# Patient Record
Sex: Female | Born: 1985 | Race: White | Hispanic: No | Marital: Single | Smoking: Current every day smoker
Health system: Southern US, Community
[De-identification: ages and names within clinical notes are randomized; demographics above are authoritative.]

## PROBLEM LIST (undated history)

## (undated) ENCOUNTER — Emergency Department: Discharge status: Absent without leave | Payer: Medicare Other

## (undated) ENCOUNTER — Emergency Department (HOSPITAL_COMMUNITY): Disposition: A | Discharge status: Other Acute Inpatient Hospital | Payer: Medicare Other

## (undated) ENCOUNTER — Emergency Department: Admission: EM | Payer: Medicare Other

## (undated) DIAGNOSIS — B009 Herpesviral infection, unspecified: Secondary | ICD-10-CM

## (undated) DIAGNOSIS — B1920 Unspecified viral hepatitis C without hepatic coma: Secondary | ICD-10-CM

## (undated) DIAGNOSIS — F1020 Alcohol dependence, uncomplicated: Secondary | ICD-10-CM

## (undated) DIAGNOSIS — F250 Schizoaffective disorder, bipolar type: Secondary | ICD-10-CM

## (undated) DIAGNOSIS — G809 Cerebral palsy, unspecified: Secondary | ICD-10-CM

## (undated) DIAGNOSIS — F1910 Other psychoactive substance abuse, uncomplicated: Secondary | ICD-10-CM

## (undated) DIAGNOSIS — I639 Cerebral infarction, unspecified: Secondary | ICD-10-CM

## (undated) DIAGNOSIS — R569 Unspecified convulsions: Secondary | ICD-10-CM

## (undated) HISTORY — PX: FOOT SURGERY: SHX648

## (undated) HISTORY — PX: LEG SURGERY: SHX1003

---
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
## 2005-01-01 ENCOUNTER — Emergency Department (HOSPITAL_COMMUNITY): Admission: EM | Admit: 2005-01-01 | Discharge: 2005-01-01 | Payer: Self-pay | Admitting: Emergency Medicine

## 2005-01-20 ENCOUNTER — Emergency Department (HOSPITAL_COMMUNITY): Admission: EM | Admit: 2005-01-20 | Discharge: 2005-01-20 | Payer: Self-pay | Admitting: Emergency Medicine

## 2005-09-16 ENCOUNTER — Emergency Department (HOSPITAL_COMMUNITY): Admission: EM | Admit: 2005-09-16 | Discharge: 2005-09-17 | Payer: Self-pay | Admitting: Emergency Medicine

## 2009-10-10 ENCOUNTER — Emergency Department (HOSPITAL_COMMUNITY): Admission: EM | Admit: 2009-10-10 | Discharge: 2009-10-10 | Payer: Self-pay | Admitting: Emergency Medicine

## 2009-10-11 ENCOUNTER — Emergency Department (HOSPITAL_COMMUNITY): Admission: EM | Admit: 2009-10-11 | Discharge: 2009-10-11 | Payer: Self-pay | Admitting: Emergency Medicine

## 2009-10-12 ENCOUNTER — Emergency Department (HOSPITAL_COMMUNITY): Admission: EM | Admit: 2009-10-12 | Discharge: 2009-10-12 | Payer: Self-pay | Admitting: Emergency Medicine

## 2009-10-22 ENCOUNTER — Inpatient Hospital Stay (HOSPITAL_COMMUNITY): Admission: EM | Admit: 2009-10-22 | Discharge: 2009-10-24 | Payer: Self-pay | Admitting: Emergency Medicine

## 2010-05-03 LAB — HEPATIC FUNCTION PANEL: AST: 74 U/L — ABNORMAL HIGH (ref 0–37)

## 2010-05-03 LAB — BASIC METABOLIC PANEL
BUN: 14 mg/dL (ref 6–23)
CO2: 20 mEq/L (ref 19–32)
Calcium: 8.4 mg/dL (ref 8.4–10.5)
Chloride: 103 mEq/L (ref 96–112)
GFR calc Af Amer: >60 mL/min (ref >60)
GFR calc non Af Amer: >60 mL/min (ref >60)
Potassium: 3.8 mEq/L (ref 3.5–5.1)
Sodium: 135 mEq/L (ref 135–145)

## 2010-05-03 LAB — COMPREHENSIVE METABOLIC PANEL
ALT: 27 U/L (ref 0–35)
AST: 46 U/L — ABNORMAL HIGH (ref 0–37)
Albumin: 3 g/dL — ABNORMAL LOW (ref 3.5–5.2)
Albumin: 3.2 g/dL — ABNORMAL LOW (ref 3.5–5.2)
Alkaline Phosphatase: 60 U/L (ref 39–117)
BUN: 7 mg/dL (ref 6–23)
CO2: 26 mEq/L (ref 19–32)
Calcium: 8.1 mg/dL — ABNORMAL LOW (ref 8.4–10.5)
Chloride: 107 mEq/L (ref 96–112)
Creatinine, Ser: 0.59 mg/dL (ref 0.4–1.2)
Creatinine, Ser: 0.66 mg/dL (ref 0.4–1.2)
GFR calc non Af Amer: >60 mL/min (ref >60)
Sodium: 138 mEq/L (ref 135–145)
Total Bilirubin: 0.7 mg/dL (ref 0.3–1.2)
Total Bilirubin: 0.8 mg/dL (ref 0.3–1.2)
Total Protein: 5.8 g/dL — ABNORMAL LOW (ref 6.0–8.3)

## 2010-05-03 LAB — URINALYSIS, ROUTINE W REFLEX MICROSCOPIC
Glucose, UA: NEGATIVE mg/dL
Ketones, ur: NEGATIVE mg/dL
Nitrite: NEGATIVE
Protein, ur: NEGATIVE mg/dL
Urobilinogen, UA: 0.2 mg/dL (ref 0.0–1.0)

## 2010-05-03 LAB — CBC
HCT: 32.1 % — ABNORMAL LOW (ref 36.0–46.0)
HCT: 33 % — ABNORMAL LOW (ref 36.0–46.0)
HCT: 34.9 % — ABNORMAL LOW (ref 36.0–46.0)
Hemoglobin: 11.1 g/dL — ABNORMAL LOW (ref 12.0–15.0)
Hemoglobin: 12 g/dL (ref 12.0–15.0)
MCH: 35 pg — ABNORMAL HIGH (ref 26.0–34.0)
MCH: 35.2 pg — ABNORMAL HIGH (ref 26.0–34.0)
MCHC: 34.5 g/dL (ref 30.0–36.0)
MCV: 100.8 fL — ABNORMAL HIGH (ref 78.0–100.0)
MCV: 101.9 fL — ABNORMAL HIGH (ref 78.0–100.0)
MCV: 102 fL — ABNORMAL HIGH (ref 78.0–100.0)
Platelets: 169 10*3/uL (ref 150–400)
RBC: 3.19 MIL/uL — ABNORMAL LOW (ref 3.87–5.11)
RBC: 3.42 MIL/uL — ABNORMAL LOW (ref 3.87–5.11)
WBC: 2.9 10*3/uL — ABNORMAL LOW (ref 4.0–10.5)

## 2010-05-03 LAB — MAGNESIUM: Magnesium: 2.1 mg/dL (ref 1.5–2.5)

## 2010-05-03 LAB — DIFFERENTIAL
Basophils Relative: 0 % (ref 0–1)
Eosinophils Absolute: 0 10*3/uL (ref 0.0–0.7)
Eosinophils Relative: 1 % (ref 0–5)
Lymphocytes Relative: 11 % — ABNORMAL LOW (ref 12–46)
Lymphocytes Relative: 43 % (ref 12–46)
Lymphs Abs: 1.1 10*3/uL (ref 0.7–4.0)
Monocytes Absolute: 0.3 10*3/uL (ref 0.1–1.0)
Monocytes Absolute: 0.5 10*3/uL (ref 0.1–1.0)
Monocytes Relative: 10 % (ref 3–12)
Monocytes Relative: 12 % (ref 3–12)
Neutro Abs: 1.1 10*3/uL — ABNORMAL LOW (ref 1.7–7.7)

## 2010-05-03 LAB — IRON AND TIBC
Iron: 83 ug/dL (ref 42–135)
Saturation Ratios: 34 % (ref 20–55)
TIBC: 244 ug/dL — ABNORMAL LOW (ref 250–470)

## 2010-05-03 LAB — URINE CULTURE: Culture  Setup Time: 201109061245

## 2010-05-03 LAB — GLUCOSE, CAPILLARY
Glucose-Capillary: 114 mg/dL — ABNORMAL HIGH (ref 70–99)
Glucose-Capillary: 134 mg/dL — ABNORMAL HIGH (ref 70–99)

## 2010-05-03 LAB — RAPID URINE DRUG SCREEN, HOSP PERFORMED
Opiates: NOT DETECTED
Tetrahydrocannabinol: NOT DETECTED

## 2010-05-03 LAB — RETICULOCYTES
Retic Count, Absolute: 21.1 10*3/uL (ref 19.0–186.0)
Retic Ct Pct: 0.6 % (ref 0.4–3.1)

## 2010-05-03 LAB — PREGNANCY, URINE: Preg Test, Ur: NEGATIVE

## 2010-05-03 LAB — FERRITIN: Ferritin: 200 ng/mL (ref 10–291)

## 2010-05-03 LAB — ETHANOL: Alcohol, Ethyl (B): 5 mg/dL (ref 0–10)

## 2010-05-03 LAB — VITAMIN B12: Vitamin B-12: 311 pg/mL (ref 211–911)

## 2010-05-04 LAB — RAPID URINE DRUG SCREEN, HOSP PERFORMED
Amphetamines: NOT DETECTED
Barbiturates: NOT DETECTED
Benzodiazepines: POSITIVE — AB
Tetrahydrocannabinol: NOT DETECTED

## 2010-05-04 LAB — GLUCOSE, CAPILLARY: Glucose-Capillary: 87 mg/dL (ref 70–99)

## 2010-05-04 LAB — CBC
HCT: 36.7 % (ref 36.0–46.0)
MCV: 101 fL — ABNORMAL HIGH (ref 78.0–100.0)
MCV: 101.7 fL — ABNORMAL HIGH (ref 78.0–100.0)
Platelets: 154 10*3/uL (ref 150–400)
Platelets: 174 10*3/uL (ref 150–400)
RBC: 3.63 MIL/uL — ABNORMAL LOW (ref 3.87–5.11)
RBC: 3.63 MIL/uL — ABNORMAL LOW (ref 3.87–5.11)
WBC: 6.1 10*3/uL (ref 4.0–10.5)
WBC: 7 10*3/uL (ref 4.0–10.5)

## 2010-05-04 LAB — BASIC METABOLIC PANEL
Calcium: 8.7 mg/dL (ref 8.4–10.5)
Chloride: 102 mEq/L (ref 96–112)
Chloride: 110 mEq/L (ref 96–112)
Creatinine, Ser: 0.75 mg/dL (ref 0.4–1.2)
Creatinine, Ser: 0.89 mg/dL (ref 0.4–1.2)
GFR calc Af Amer: >60 mL/min (ref >60)
GFR calc Af Amer: >60 mL/min (ref >60)
Potassium: 3.9 mEq/L (ref 3.5–5.1)

## 2010-05-04 LAB — URINALYSIS, ROUTINE W REFLEX MICROSCOPIC
Bilirubin Urine: NEGATIVE
Ketones, ur: NEGATIVE mg/dL
Nitrite: NEGATIVE
Nitrite: NEGATIVE
Protein, ur: NEGATIVE mg/dL
Specific Gravity, Urine: 1.019 (ref 1.005–1.030)
pH: 7 (ref 5.0–8.0)

## 2010-05-04 LAB — PREGNANCY, URINE
Preg Test, Ur: NEGATIVE
Preg Test, Ur: NEGATIVE

## 2010-05-04 LAB — DIFFERENTIAL
Eosinophils Relative: 2 % (ref 0–5)
Lymphocytes Relative: 22 % (ref 12–46)
Lymphs Abs: 1.5 10*3/uL (ref 0.7–4.0)
Neutro Abs: 4.8 10*3/uL (ref 1.7–7.7)

## 2010-05-04 LAB — ETHANOL
Alcohol, Ethyl (B): 351 mg/dL — ABNORMAL HIGH (ref 0–10)
Alcohol, Ethyl (B): 7 mg/dL (ref 0–10)

## 2010-05-04 LAB — POCT I-STAT, CHEM 8
BUN: 9 mg/dL (ref 6–23)
Calcium, Ion: 0.82 mmol/L — ABNORMAL LOW (ref 1.12–1.32)
Chloride: 111 mEq/L (ref 96–112)

## 2010-05-04 LAB — URINE MICROSCOPIC-ADD ON

## 2013-08-29 ENCOUNTER — Emergency Department (HOSPITAL_COMMUNITY): Payer: Medicaid Other

## 2013-08-29 ENCOUNTER — Emergency Department (HOSPITAL_COMMUNITY)
Admission: EM | Admit: 2013-08-29 | Discharge: 2013-08-29 | Disposition: A | Discharge status: Home / Self Care | Payer: Medicaid Other | Attending: Emergency Medicine | Admitting: Emergency Medicine

## 2013-08-29 ENCOUNTER — Encounter (HOSPITAL_COMMUNITY): Payer: Self-pay | Admitting: Emergency Medicine

## 2013-08-29 DIAGNOSIS — G40909 Epilepsy, unspecified, not intractable, without status epilepticus: Secondary | ICD-10-CM | POA: Insufficient documentation

## 2013-08-29 DIAGNOSIS — F17200 Nicotine dependence, unspecified, uncomplicated: Secondary | ICD-10-CM | POA: Diagnosis not present

## 2013-08-29 DIAGNOSIS — M79601 Pain in right arm: Secondary | ICD-10-CM

## 2013-08-29 DIAGNOSIS — Z79899 Other long term (current) drug therapy: Secondary | ICD-10-CM | POA: Diagnosis not present

## 2013-08-29 DIAGNOSIS — Z8673 Personal history of transient ischemic attack (TIA), and cerebral infarction without residual deficits: Secondary | ICD-10-CM | POA: Diagnosis not present

## 2013-08-29 DIAGNOSIS — Z8619 Personal history of other infectious and parasitic diseases: Secondary | ICD-10-CM | POA: Diagnosis not present

## 2013-08-29 DIAGNOSIS — M79609 Pain in unspecified limb: Secondary | ICD-10-CM | POA: Insufficient documentation

## 2013-08-29 DIAGNOSIS — Z791 Long term (current) use of non-steroidal anti-inflammatories (NSAID): Secondary | ICD-10-CM | POA: Insufficient documentation

## 2013-08-29 HISTORY — DX: Cerebral palsy, unspecified: G80.9

## 2013-08-29 HISTORY — DX: Unspecified viral hepatitis C without hepatic coma: B19.20

## 2013-08-29 HISTORY — DX: Herpesviral infection, unspecified: B00.9

## 2013-08-29 HISTORY — DX: Unspecified convulsions: R56.9

## 2013-08-29 HISTORY — DX: Cerebral infarction, unspecified: I63.9

## 2013-08-29 MED ORDER — NAPROXEN 500 MG PO TABS: 500.0000 mg | ORAL_TABLET | Freq: Two times a day (BID) | ORAL | Status: DC

## 2013-08-29 NOTE — ED Notes (Signed)
Patient with no complaints at this time. Respirations even and unlabored. Skin warm/dry. Discharge instructions reviewed with patient at this time. Patient given opportunity to voice concerns/ask questions. Patient discharged at this time and left Emergency Department with steady gait.   
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
## 2013-08-29 NOTE — Discharge Instructions (Signed)
Xray normal;  meds for inflammation
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
## 2013-09-14 ENCOUNTER — Emergency Department (HOSPITAL_COMMUNITY): Payer: Medicare Other

## 2013-09-14 ENCOUNTER — Encounter (HOSPITAL_COMMUNITY): Payer: Self-pay | Admitting: Emergency Medicine

## 2013-09-14 ENCOUNTER — Inpatient Hospital Stay (HOSPITAL_COMMUNITY)
Admission: EM | Admit: 2013-09-14 | Discharge: 2013-09-20 | DRG: 504 | Disposition: A | Discharge status: Nursing Home (Includes ICF) | Payer: Medicare Other | Attending: Internal Medicine | Admitting: Internal Medicine

## 2013-09-14 DIAGNOSIS — B182 Chronic viral hepatitis C: Secondary | ICD-10-CM

## 2013-09-14 DIAGNOSIS — G40909 Epilepsy, unspecified, not intractable, without status epilepticus: Secondary | ICD-10-CM | POA: Diagnosis present

## 2013-09-14 DIAGNOSIS — Z818 Family history of other mental and behavioral disorders: Secondary | ICD-10-CM

## 2013-09-14 DIAGNOSIS — D7589 Other specified diseases of blood and blood-forming organs: Secondary | ICD-10-CM | POA: Diagnosis present

## 2013-09-14 DIAGNOSIS — L03031 Cellulitis of right toe: Secondary | ICD-10-CM

## 2013-09-14 DIAGNOSIS — R569 Unspecified convulsions: Secondary | ICD-10-CM

## 2013-09-14 DIAGNOSIS — F250 Schizoaffective disorder, bipolar type: Secondary | ICD-10-CM

## 2013-09-14 DIAGNOSIS — M79609 Pain in unspecified limb: Secondary | ICD-10-CM

## 2013-09-14 DIAGNOSIS — M79674 Pain in right toe(s): Secondary | ICD-10-CM

## 2013-09-14 DIAGNOSIS — B1920 Unspecified viral hepatitis C without hepatic coma: Secondary | ICD-10-CM

## 2013-09-14 DIAGNOSIS — L02611 Cutaneous abscess of right foot: Secondary | ICD-10-CM | POA: Diagnosis present

## 2013-09-14 DIAGNOSIS — Z8673 Personal history of transient ischemic attack (TIA), and cerebral infarction without residual deficits: Secondary | ICD-10-CM

## 2013-09-14 DIAGNOSIS — L02619 Cutaneous abscess of unspecified foot: Secondary | ICD-10-CM | POA: Diagnosis present

## 2013-09-14 DIAGNOSIS — G809 Cerebral palsy, unspecified: Secondary | ICD-10-CM | POA: Diagnosis present

## 2013-09-14 DIAGNOSIS — F17200 Nicotine dependence, unspecified, uncomplicated: Secondary | ICD-10-CM | POA: Diagnosis present

## 2013-09-14 DIAGNOSIS — L97509 Non-pressure chronic ulcer of other part of unspecified foot with unspecified severity: Secondary | ICD-10-CM | POA: Diagnosis present

## 2013-09-14 DIAGNOSIS — M869 Osteomyelitis, unspecified: Secondary | ICD-10-CM | POA: Diagnosis not present

## 2013-09-14 DIAGNOSIS — F259 Schizoaffective disorder, unspecified: Secondary | ICD-10-CM | POA: Diagnosis present

## 2013-09-14 DIAGNOSIS — L03039 Cellulitis of unspecified toe: Secondary | ICD-10-CM | POA: Diagnosis present

## 2013-09-14 DIAGNOSIS — L03115 Cellulitis of right lower limb: Secondary | ICD-10-CM

## 2013-09-14 DIAGNOSIS — L03119 Cellulitis of unspecified part of limb: Secondary | ICD-10-CM

## 2013-09-14 HISTORY — DX: Schizoaffective disorder, bipolar type: F25.0

## 2013-09-14 LAB — CBC WITH DIFFERENTIAL/PLATELET
Basophils Absolute: 0 10*3/uL (ref 0.0–0.1)
Basophils Relative: 0 % (ref 0–1)
Eosinophils Absolute: 0.1 10*3/uL (ref 0.0–0.7)
Eosinophils Relative: 1 % (ref 0–5)
HCT: 40 % (ref 36.0–46.0)
Hemoglobin: 13.6 g/dL (ref 12.0–15.0)
Lymphocytes Relative: 20 % (ref 12–46)
Lymphs Abs: 1.8 10*3/uL (ref 0.7–4.0)
MCH: 37 pg — ABNORMAL HIGH (ref 26.0–34.0)
MCHC: 34 g/dL (ref 30.0–36.0)
MCV: 108.7 fL — ABNORMAL HIGH (ref 78.0–100.0)
Monocytes Absolute: 1 10*3/uL (ref 0.1–1.0)
Monocytes Relative: 11 % (ref 3–12)
Neutro Abs: 6.3 10*3/uL (ref 1.7–7.7)
Neutrophils Relative %: 68 % (ref 43–77)
Platelets: 261 10*3/uL (ref 150–400)
RBC: 3.68 MIL/uL — ABNORMAL LOW (ref 3.87–5.11)
RDW: 12.5 % (ref 11.5–15.5)
WBC: 9.2 10*3/uL (ref 4.0–10.5)

## 2013-09-14 LAB — BASIC METABOLIC PANEL
Anion gap: 13 (ref 5–15)
BUN: 6 mg/dL (ref 6–23)
CO2: 26 mEq/L (ref 19–32)
Calcium: 9.3 mg/dL (ref 8.4–10.5)
Chloride: 95 mEq/L — ABNORMAL LOW (ref 96–112)
Creatinine, Ser: 0.65 mg/dL (ref 0.50–1.10)
GFR calc Af Amer: >90 mL/min (ref >90)
GFR calc non Af Amer: >90 mL/min (ref >90)
Glucose, Bld: 96 mg/dL (ref 70–99)
Potassium: 4.5 mEq/L (ref 3.7–5.3)
Sodium: 134 mEq/L — ABNORMAL LOW (ref 137–147)

## 2013-09-14 MED ORDER — NICOTINE 14 MG/24HR TD PT24
14.0000 mg | MEDICATED_PATCH | Freq: Once | TRANSDERMAL | Status: AC
  Administered 2013-09-14: 14 mg via TRANSDERMAL
  Filled 2013-09-14 (×2): qty 1

## 2013-09-14 MED ORDER — CLINDAMYCIN PHOSPHATE 600 MG/50ML IV SOLN
600.0000 mg | Freq: Three times a day (TID) | INTRAVENOUS | Status: DC
  Administered 2013-09-14 – 2013-09-15 (×2): 600 mg via INTRAVENOUS
  Filled 2013-09-14 (×3): qty 50

## 2013-09-14 MED ORDER — CLINDAMYCIN PHOSPHATE 600 MG/50ML IV SOLN
600.0000 mg | Freq: Once | INTRAVENOUS | Status: AC
  Administered 2013-09-14: 600 mg via INTRAVENOUS
  Filled 2013-09-14: qty 50

## 2013-09-14 MED ORDER — CLINDAMYCIN HCL 150 MG PO CAPS: 450.0000 mg | ORAL_CAPSULE | Freq: Once | ORAL | Status: DC

## 2013-09-14 MED ORDER — DEXTROSE 5 % IV SOLN: 450.0000 mg | Freq: Once | INTRAVENOUS | Status: DC

## 2013-09-14 MED ORDER — OXYCODONE-ACETAMINOPHEN 5-325 MG PO TABS
1.0000 | ORAL_TABLET | Freq: Once | ORAL | Status: AC
  Administered 2013-09-14: 1 via ORAL
  Filled 2013-09-14: qty 1

## 2013-09-14 MED ORDER — NAPROXEN 250 MG PO TABS
500.0000 mg | ORAL_TABLET | Freq: Two times a day (BID) | ORAL | Status: DC
  Administered 2013-09-14 – 2013-09-20 (×11): 500 mg via ORAL
  Filled 2013-09-14 (×11): qty 2

## 2013-09-14 MED ORDER — CLINDAMYCIN PHOSPHATE 600 MG/50ML IV SOLN
INTRAVENOUS | Status: AC
  Filled 2013-09-14: qty 100

## 2013-09-14 MED ORDER — DIVALPROEX SODIUM ER 250 MG PO TB24
250.0000 mg | ORAL_TABLET | Freq: Every day | ORAL | Status: DC
  Administered 2013-09-15 – 2013-09-20 (×6): 250 mg via ORAL
  Filled 2013-09-14 (×11): qty 1

## 2013-09-14 MED ORDER — BUPIVACAINE HCL (PF) 0.5 % IJ SOLN
10.0000 mL | Freq: Once | INTRAMUSCULAR | Status: AC
  Administered 2013-09-14: 10 mL
  Filled 2013-09-14: qty 30

## 2013-09-14 MED ORDER — HYDROCODONE-ACETAMINOPHEN 5-325 MG PO TABS
1.0000 | ORAL_TABLET | ORAL | Status: DC | PRN
  Administered 2013-09-14: 1 via ORAL
  Administered 2013-09-15 – 2013-09-18 (×14): 2 via ORAL
  Filled 2013-09-14 (×8): qty 2
  Filled 2013-09-14: qty 1
  Filled 2013-09-14 (×6): qty 2

## 2013-09-14 MED ORDER — SODIUM CHLORIDE 0.9 % IV SOLN
INTRAVENOUS | Status: AC
  Administered 2013-09-14: 22:00:00 via INTRAVENOUS

## 2013-09-14 NOTE — ED Notes (Signed)
Labs redrawn by nurse d/t blood hemolyzed from IV stick and lab unable to draw.
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
## 2013-09-14 NOTE — ED Notes (Signed)
Pt states he right second toe is infected. States she noticed it last week
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
## 2013-09-14 NOTE — ED Notes (Signed)
Verbal order to hold po med d/t MD changing med to IV

## 2013-09-14 NOTE — ED Notes (Signed)
MD at bedside. 

## 2013-09-14 NOTE — ED Notes (Signed)
Pt c/o pain, swelling, redness to right second toe, denies any injury, pt states that she was sent to er from health department for further evaluation,

## 2013-09-15 ENCOUNTER — Inpatient Hospital Stay (HOSPITAL_COMMUNITY): Payer: Medicare Other

## 2013-09-15 DIAGNOSIS — G40909 Epilepsy, unspecified, not intractable, without status epilepticus: Secondary | ICD-10-CM | POA: Diagnosis present

## 2013-09-15 DIAGNOSIS — D7589 Other specified diseases of blood and blood-forming organs: Secondary | ICD-10-CM | POA: Diagnosis present

## 2013-09-15 DIAGNOSIS — B1920 Unspecified viral hepatitis C without hepatic coma: Secondary | ICD-10-CM | POA: Diagnosis present

## 2013-09-15 DIAGNOSIS — L02619 Cutaneous abscess of unspecified foot: Secondary | ICD-10-CM | POA: Diagnosis present

## 2013-09-15 DIAGNOSIS — L03039 Cellulitis of unspecified toe: Secondary | ICD-10-CM | POA: Diagnosis present

## 2013-09-15 DIAGNOSIS — Z8673 Personal history of transient ischemic attack (TIA), and cerebral infarction without residual deficits: Secondary | ICD-10-CM | POA: Diagnosis not present

## 2013-09-15 DIAGNOSIS — L97509 Non-pressure chronic ulcer of other part of unspecified foot with unspecified severity: Secondary | ICD-10-CM | POA: Diagnosis present

## 2013-09-15 DIAGNOSIS — M79609 Pain in unspecified limb: Secondary | ICD-10-CM | POA: Diagnosis present

## 2013-09-15 DIAGNOSIS — M869 Osteomyelitis, unspecified: Secondary | ICD-10-CM | POA: Diagnosis present

## 2013-09-15 DIAGNOSIS — F259 Schizoaffective disorder, unspecified: Secondary | ICD-10-CM | POA: Diagnosis present

## 2013-09-15 DIAGNOSIS — Z818 Family history of other mental and behavioral disorders: Secondary | ICD-10-CM | POA: Diagnosis not present

## 2013-09-15 DIAGNOSIS — F17200 Nicotine dependence, unspecified, uncomplicated: Secondary | ICD-10-CM | POA: Diagnosis present

## 2013-09-15 DIAGNOSIS — L03119 Cellulitis of unspecified part of limb: Secondary | ICD-10-CM | POA: Diagnosis present

## 2013-09-15 DIAGNOSIS — G809 Cerebral palsy, unspecified: Secondary | ICD-10-CM | POA: Diagnosis present

## 2013-09-15 LAB — CBC
HCT: 37.5 % (ref 36.0–46.0)
HEMOGLOBIN: 12.7 g/dL (ref 12.0–15.0)
MCH: 36.6 pg — AB (ref 26.0–34.0)
MCHC: 33.9 g/dL (ref 30.0–36.0)
MCV: 108.1 fL — AB (ref 78.0–100.0)
PLATELETS: 238 10*3/uL (ref 150–400)
RBC: 3.47 MIL/uL — AB (ref 3.87–5.11)
RDW: 12.4 % (ref 11.5–15.5)
WBC: 7.9 10*3/uL (ref 4.0–10.5)

## 2013-09-15 LAB — BASIC METABOLIC PANEL
Anion gap: 12 (ref 5–15)
BUN: 6 mg/dL (ref 6–23)
CO2: 26 mEq/L (ref 19–32)
Calcium: 8.7 mg/dL (ref 8.4–10.5)
Chloride: 96 mEq/L (ref 96–112)
Creatinine, Ser: 0.59 mg/dL (ref 0.50–1.10)
GFR calc Af Amer: >90 mL/min (ref >90)
GFR calc non Af Amer: >90 mL/min (ref >90)
Glucose, Bld: 86 mg/dL (ref 70–99)
Potassium: 3.9 mEq/L (ref 3.7–5.3)
Sodium: 134 mEq/L — ABNORMAL LOW (ref 137–147)

## 2013-09-15 LAB — HEPATIC FUNCTION PANEL
ALK PHOS: 67 U/L (ref 39–117)
ALT: 8 U/L (ref 0–35)
AST: 14 U/L (ref 0–37)
Albumin: 3.4 g/dL — ABNORMAL LOW (ref 3.5–5.2)
BILIRUBIN TOTAL: 0.3 mg/dL (ref 0.3–1.2)
Bilirubin, Direct: <0.2 mg/dL (ref 0.0–0.3)
TOTAL PROTEIN: 6.8 g/dL (ref 6.0–8.3)

## 2013-09-15 LAB — VALPROIC ACID LEVEL: Valproic Acid Lvl: 15 ug/mL — ABNORMAL LOW (ref 50.0–100.0)

## 2013-09-15 LAB — TSH: TSH: 1.43 u[IU]/mL (ref 0.350–4.500)

## 2013-09-15 LAB — PREGNANCY, URINE: Preg Test, Ur: NEGATIVE

## 2013-09-15 MED ORDER — GADOBENATE DIMEGLUMINE 529 MG/ML IV SOLN
14.0000 mL | Freq: Once | INTRAVENOUS | Status: AC | PRN
  Administered 2013-09-15: 14 mL via INTRAVENOUS

## 2013-09-15 MED ORDER — VANCOMYCIN HCL IN DEXTROSE 1-5 GM/200ML-% IV SOLN
1000.0000 mg | Freq: Two times a day (BID) | INTRAVENOUS | Status: DC
  Administered 2013-09-15 – 2013-09-17 (×5): 1000 mg via INTRAVENOUS
  Filled 2013-09-15 (×11): qty 200

## 2013-09-15 MED ORDER — LORAZEPAM 2 MG/ML IJ SOLN: 0.5000 mg | Freq: Once | INTRAMUSCULAR | Status: AC | PRN

## 2013-09-15 MED ORDER — HYDROMORPHONE HCL PF 1 MG/ML IJ SOLN
0.5000 mg | INTRAMUSCULAR | Status: DC | PRN
  Administered 2013-09-17 – 2013-09-18 (×3): 0.5 mg via INTRAVENOUS
  Filled 2013-09-15 (×3): qty 1

## 2013-09-15 MED ORDER — ENOXAPARIN SODIUM 40 MG/0.4ML ~~LOC~~ SOLN
40.0000 mg | SUBCUTANEOUS | Status: DC
  Administered 2013-09-15 – 2013-09-16 (×2): 40 mg via SUBCUTANEOUS
  Filled 2013-09-15 (×2): qty 0.4

## 2013-09-15 NOTE — Progress Notes (Signed)
ANTIBIOTIC CONSULT NOTE - INITIAL  Pharmacy Consult for Vancomycin Indication: cellulitis  No Known Allergies  Patient Measurements: Height: 5\' 4"  (162.6 cm) Weight: 155 lb 12.8 oz (70.67 kg) IBW/kg (Calculated) : 54.7  Vital Signs: Temp: 97.4 F (36.3 C) (07/29 0519) Temp src: Oral (07/29 0519) BP: 92/63 mmHg (07/29 0519) Pulse Rate: 63 (07/29 0519) Intake/Output from previous day: 07/28 0701 - 07/29 0700 In: 675 [I.V.:575; IV Piggyback:100] Out: -  Intake/Output from this shift:    Labs:  Recent Labs  09/14/13 1820 09/15/13 0520  WBC 9.2 7.9  HGB 13.6 12.7  PLT 261 238  CREATININE 0.65 0.59   Estimated Creatinine Clearance: 100.1 ml/min (by C-G formula based on Cr of 0.59). No results found for this basename: VANCOTROUGH, VANCOPEAK, VANCORANDOM, GENTTROUGH, GENTPEAK, GENTRANDOM, TOBRATROUGH, TOBRAPEAK, TOBRARND, AMIKACINPEAK, AMIKACINTROU, AMIKACIN,  in the last 72 hours   Microbiology: No results found for this or any previous visit (from the past 720 hour(s)).  Medical History: Past Medical History  Diagnosis Date  . CP (cerebral palsy)   . Hepatitis C   . Herpes   . Stroke   . Seizures     Medications:  Scheduled:  . divalproex  250 mg Oral Daily  . naproxen  500 mg Oral BID  . nicotine  14 mg Transdermal Once   Assessment: 28 yo F with cerebal palsy who lives in a group home.  She presents with worsening pain/swelling/redness of right 2nd toe.   She is afebrile with normal WBC.   MRI of toe & ortho consult pending.   Vancomycin 7/29>> Clindamycin 7/28>>7/29  Goal of Therapy:  Vancomycin trough level 10-15 mcg/ml  Plan:  Vancomycin 1gm IV q12h Check Vancomycin trough at steady state Monitor renal function and cx data   Elson ClanLilliston, Sandip Power Michelle 09/15/2013,7:59 AM

## 2013-09-15 NOTE — Care Management Note (Addendum)
    Page 1 of 1   09/20/2013     11:44:58 AM CARE MANAGEMENT NOTE 09/20/2013  Patient:  Sylvia [name],Sylvia [name]   Account Number:  [id number]  Date Initiated:  09/15/2013  Documentation initiated by:  Sharrie RothmanBLACKWELL,TAMMY C  Subjective/Objective Assessment:   [facility]. Pt will return to facility at discharge.     Action/Plan:   CSW to arrange discharge to facility when medically stable.   Anticipated DC Date:  09/18/2013   Anticipated DC Plan:  ASSISTED LIVING / REST HOME      DC Planning Services  CM consult      Choice offered to / List presented to:  C-2 HC POA / Guardian   DME arranged  [facility]      DME agency  [facility]        Status of service:  Completed, signed off Medicare Important Message given?   (If response is "NO", the following Medicare IM given date fields will be blank) Date Medicare IM given:   Medicare IM given by:   Date Additional Medicare IM given:   Additional Medicare IM given by:    Discharge Disposition:  ASSISTED LIVING  Per UR Regulation:    If discussed at Long Length of Stay Meetings, dates discussed:    Comments:  09/20/2013 1140 Kathyrn SheriffJessica Childress, RN, MSN, PCCN Rolling walker order and (per POA request) Kara MeadEmma of [facility]. [facility] to deliver rolling walker before discharge. Discharge planned for today. Patient will be Discharge to ALF as arranged by CSW. No further CM needs at this time.  09/17/13 1116 Arlyss Queenammy Blackwell, RN BSN CM Pt for surgery today. Will probably be inpatient until Monday. CSW working to find new ALF placement for discharge.  09/15/13 1345 Arlyss Queenammy Blackwell, RN BSN CM
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
## 2013-09-15 NOTE — Consult Note (Signed)
Patient seen,  chart reviewed. As I will be leaving town, I have suggested to Dr. Allen NorrisSantini that Dr. Nolen MuMcKinney of podiatry be consulted. He will be here tomorrow. He will place the consult.
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
## 2013-09-16 DIAGNOSIS — M869 Osteomyelitis, unspecified: Secondary | ICD-10-CM | POA: Diagnosis present

## 2013-09-16 DIAGNOSIS — L03039 Cellulitis of unspecified toe: Secondary | ICD-10-CM

## 2013-09-16 DIAGNOSIS — G40909 Epilepsy, unspecified, not intractable, without status epilepticus: Secondary | ICD-10-CM

## 2013-09-16 DIAGNOSIS — L02619 Cutaneous abscess of unspecified foot: Secondary | ICD-10-CM

## 2013-09-16 DIAGNOSIS — L03031 Cellulitis of right toe: Secondary | ICD-10-CM

## 2013-09-16 DIAGNOSIS — L02611 Cutaneous abscess of right foot: Secondary | ICD-10-CM | POA: Diagnosis present

## 2013-09-16 LAB — CBC
HEMATOCRIT: 35.2 % — AB (ref 36.0–46.0)
Hemoglobin: 12.3 g/dL (ref 12.0–15.0)
MCH: 37.6 pg — AB (ref 26.0–34.0)
MCHC: 34.9 g/dL (ref 30.0–36.0)
MCV: 107.6 fL — ABNORMAL HIGH (ref 78.0–100.0)
Platelets: 243 10*3/uL (ref 150–400)
RBC: 3.27 MIL/uL — ABNORMAL LOW (ref 3.87–5.11)
RDW: 12.3 % (ref 11.5–15.5)
WBC: 5 10*3/uL (ref 4.0–10.5)

## 2013-09-16 LAB — BASIC METABOLIC PANEL
Anion gap: 11 (ref 5–15)
BUN: 9 mg/dL (ref 6–23)
CALCIUM: 9.2 mg/dL (ref 8.4–10.5)
CO2: 26 mEq/L (ref 19–32)
Chloride: 105 mEq/L (ref 96–112)
Creatinine, Ser: 0.6 mg/dL (ref 0.50–1.10)
GFR calc Af Amer: >90 mL/min (ref >90)
Glucose, Bld: 92 mg/dL (ref 70–99)
Potassium: 4.4 mEq/L (ref 3.7–5.3)
Sodium: 142 mEq/L (ref 137–147)

## 2013-09-16 MED ORDER — NICOTINE POLACRILEX 2 MG MT GUM
2.0000 mg | CHEWING_GUM | OROMUCOSAL | Status: DC | PRN
  Administered 2013-09-17 – 2013-09-20 (×11): 2 mg via ORAL
  Filled 2013-09-16 (×17): qty 1

## 2013-09-16 NOTE — Progress Notes (Signed)
I was contacted by the hospital case manager.  She informed me that Ms. Sylvia [name] has a guardian that will need to provide consent for the planned procedure.  She also stated that [name] will be relocating to a home in [place] upon discharge.  I asked if she would be able to attend her scheduled postoperative appointments.  I anticipate that she will need 1-2 postoperative appointments provide postoperative course is uneventful.  If she is unable to attend the scheduled postoperative appointments, I would recommend [place].  This would make it easier for her to attend scheduled postoperative appointments.
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
## 2013-09-16 NOTE — Consult Note (Signed)
Podiatry Consult Note  Reason for Consultation:  Osteomyelitis of the right second toe  History of Present Illness: Sylvia [name] is a 28 y.o. female who presented to the emergency department at the recommendation of Ferdie Ping with [facility].  She has been experiencing pain in the right second toe for about a week.  She has noticed redness and swelling of the right second toe.  Her symptoms have developed gradually.  She does not recall any trauma or inciting event.  Radiographs have been performed.  Paronychia was drained in the emergency room per the note of Dr. Juleen China.    Past Medical History  Diagnosis Date  . CP (cerebral palsy)   . Hepatitis C   . Herpes   . Stroke   . Seizures    Scheduled Meds: . divalproex  250 mg Oral Daily  . enoxaparin (LOVENOX) injection  40 mg Subcutaneous Q24H  . naproxen  500 mg Oral BID  . vancomycin  1,000 mg Intravenous Q12H   Continuous Infusions:  PRN Meds:.HYDROcodone-acetaminophen, HYDROmorphone (DILAUDID) injection  No Known Allergies Past Surgical History  Procedure Laterality Date  . Leg surgery Right     x7   Family History  Problem Relation Age of Onset  . Bipolar disorder Mother    Social History:  reports that she has been smoking Cigarettes.  She has a 14 pack-year smoking history. She has quit using smokeless tobacco. She reports that she drinks alcohol. She reports that she does not use illicit drugs.  Review of Systems: She denies any nausea, vomiting, fever or chills.  She has been experiencing pain, swelling and redness in her right second toe.  Physical Examination: Vital signs in last 24 hours:   Temp:  [97.7 F (36.5 C)-98 F (36.7 C)] 97.7 F (36.5 C) (07/30 0607) Pulse Rate:  [61-68] 68 (07/30 0607) Resp:  [11-20] 11 (07/30 0607) BP: (91-118)/(48-72) 91/48 mmHg (07/30 0607) SpO2:  [99 %-100 %] 99 % (07/30 0607)  Skin of both feet is warm.  There is erythema of the distal aspect of  the right second toe with an open wound present lateral to the nail.  No purulence was encountered.  There is marked edema of the distal aspect of the right second toe.  There are no ascending signs of cellulitis or lymphangitis present.  There is a mild hyperkeratotic lesion noted along the medial plantar aspect of the first metatarsal head of both feet.  Nails are long 1 through 5 bilaterally.  Dorsalis pedis pulses palpable bilaterally.  Posterior tibial pulses palpable bilaterally.  The fourth digit of both feet is retracted proximally consistent with brachymetatarsia.  Light touch sensation is grossly intact bilaterally.  Lab/Test Results:   Recent Labs  09/15/13 0520 09/16/13 0511  WBC 7.9 5.0  HGB 12.7 12.3  HCT 37.5 35.2*  PLT 238 243  NA 134* 142  K 3.9 4.4  CL 96 105  CO2 26 26  BUN 6 9  CREATININE 0.59 0.60  GLUCOSE 86 92  CALCIUM 8.7 9.2    No results found for this or any previous visit (from the past 240 hour(s)).   Mr Toes Right Wo/w Cm  09/16/2013   CLINICAL DATA:  Swelling and red RIGHT second toe.  Osteomyelitis.  EXAM: MRI OF THE RIGHT TOES WITHOUT AND WITH CONTRAST  TECHNIQUE: Multiplanar, multisequence MR imaging was performed both before and after administration of intravenous contrast.  CONTRAST:  14mL MULTIHANCE GADOBENATE DIMEGLUMINE 529 MG/ML  IV SOLN  COMPARISON:  Radiographs 09/14/2013.  FINDINGS: Hypoplastic fourth metatarsal. Diffuse edema and enhancement is present in the tip of the second toe of surrounding the terminal phalanx. There is ulceration along the tip of the toe, in the plantar and lateral surface. There is osteomyelitis of the terminal phalanx with effacement of normal fatty marrow and edema/enhancement. Osteolysis of the cortex of the terminal phalanx is present. Abscess extending deep to the ulceration measures 13 mm transverse, 15 mm plantar to dorsal and 1 cm in the long axis of the foot.  The other toes appear within normal limits aside from  shortening of the fourth toe. There is no osteomyelitis of the middle or proximal phalanx of the second toe. No clear evidence of septic arthritis.  IMPRESSION: Osteomyelitis of the terminal phalanx of the second toe with approximately 1 cm soft tissue abscess deep to ulcer on the second toe.   Electronically Signed   By: Andreas NewportGeoffrey  Lamke M.D.   On: 09/16/2013 08:13   Dg Foot Complete Right  09/14/2013   CLINICAL DATA:  Right foot pain.  EXAM: RIGHT FOOT COMPLETE - 3+ VIEW  COMPARISON:  None.  FINDINGS: There is no evidence of fracture or dislocation. There is no evidence of arthropathy. Congenital shortening of fourth metatarsal is noted. Soft tissues are unremarkable.  IMPRESSION: No acute abnormality seen in the right foot.   Electronically Signed   By: Roque LiasJames  Green M.D.   On: 09/14/2013 14:37    Assessment: 1.  Osteomyelitis of the distal phalanx of the second toe, right foot 2.  Cellulitis of the second toe, right foot  Plan: The podiatric pathology and treatment options were explained to her.  A partial amputation of the right second toe was recommended.  The benefits and risk of the surgical procedure were explained to her.  She agrees with this course of care.  Surgery is planned for tomorrow.  Cultures will be obtained in surgery.  Continue current IV antibiotic therapy.  She is to be NPO after midnight.  A Betadine dressing was applied to the right foot.  Cambri Plourde IVAN 09/16/2013, 9:41 AM

## 2013-09-16 NOTE — Clinical Social Work Note (Signed)
Guardian called and requested for pt to write statement saying whether she agrees with amputation. [name] to call and talk to pt first. CSW discussed with RN who agrees to follow up with pt about this later today and has guardian's fax number if pt agrees to complete.  Derenda FennelKara Lewellyn Fultz, [place] [phone]

## 2013-09-16 NOTE — Clinical Social Work Note (Signed)
CSW spoke with pt's guardian this morning and notified her of need for amputation. She requested to speak to RN regarding consent. Sylvia [name] has faxed documents to Dr. Loralie ChampagneMcKinney's office for him to complete. Dr. Nolen MuMcKinney notified and office has faxed these back. CSW also faxed consult note and progress note from hospitalist today to guardian at her request. [facility] once decision is made. Sylvia [name] reports she has arranged for alternative family living home in [name] ([name]) to get pt from hospital. She said that they are willing to transport pt to her follow up appointments after surgery with Dr. Nolen MuMcKinney. [name] plans to inform [name] of change in facilities. Also requested [name] to let CSW know contact information for [name] to see if they will need hospital clinicals.  Derenda FennelKara Raivyn Kabler, [place] [phone]

## 2013-09-17 ENCOUNTER — Encounter (HOSPITAL_COMMUNITY): Payer: Medicare Other | Admitting: Anesthesiology

## 2013-09-17 ENCOUNTER — Inpatient Hospital Stay (HOSPITAL_COMMUNITY): Payer: Medicare Other

## 2013-09-17 ENCOUNTER — Encounter (HOSPITAL_COMMUNITY): Payer: Self-pay | Admitting: Internal Medicine

## 2013-09-17 ENCOUNTER — Inpatient Hospital Stay (HOSPITAL_COMMUNITY): Payer: Medicare Other | Admitting: Anesthesiology

## 2013-09-17 ENCOUNTER — Encounter (HOSPITAL_COMMUNITY)
Admission: EM | Disposition: A | Discharge status: Nursing Home (Includes ICF) | Payer: Self-pay | Source: Home / Self Care | Attending: Internal Medicine

## 2013-09-17 DIAGNOSIS — F259 Schizoaffective disorder, unspecified: Secondary | ICD-10-CM

## 2013-09-17 DIAGNOSIS — F250 Schizoaffective disorder, bipolar type: Secondary | ICD-10-CM

## 2013-09-17 DIAGNOSIS — G40909 Epilepsy, unspecified, not intractable, without status epilepticus: Secondary | ICD-10-CM | POA: Diagnosis present

## 2013-09-17 HISTORY — DX: Schizoaffective disorder, unspecified: F25.9

## 2013-09-17 HISTORY — PX: AMPUTATION: SHX166

## 2013-09-17 LAB — SURGICAL PCR SCREEN
MRSA, PCR: NEGATIVE
Staphylococcus aureus: POSITIVE — AB

## 2013-09-17 LAB — VANCOMYCIN, TROUGH: Vancomycin Tr: 8.5 ug/mL — ABNORMAL LOW (ref 10.0–20.0)

## 2013-09-17 LAB — BASIC METABOLIC PANEL
Anion gap: 11 (ref 5–15)
BUN: 9 mg/dL (ref 6–23)
CALCIUM: 9.3 mg/dL (ref 8.4–10.5)
CO2: 25 mEq/L (ref 19–32)
CREATININE: 0.62 mg/dL (ref 0.50–1.10)
Chloride: 104 mEq/L (ref 96–112)
GFR calc Af Amer: >90 mL/min (ref >90)
GLUCOSE: 102 mg/dL — AB (ref 70–99)
Potassium: 4.3 mEq/L (ref 3.7–5.3)
Sodium: 140 mEq/L (ref 137–147)

## 2013-09-17 SURGERY — AMPUTATION DIGIT
Anesthesia: Monitor Anesthesia Care | Site: Foot | Laterality: Right

## 2013-09-17 MED ORDER — FENTANYL CITRATE 0.05 MG/ML IJ SOLN
25.0000 ug | INTRAMUSCULAR | Status: AC
  Administered 2013-09-17 (×2): 25 ug via INTRAVENOUS

## 2013-09-17 MED ORDER — CHLORHEXIDINE GLUCONATE CLOTH 2 % EX PADS
6.0000 | MEDICATED_PAD | Freq: Every day | CUTANEOUS | Status: DC
  Administered 2013-09-18 – 2013-09-20 (×3): 6 via TOPICAL

## 2013-09-17 MED ORDER — BUPIVACAINE HCL (PF) 0.5 % IJ SOLN
INTRAMUSCULAR | Status: AC
  Filled 2013-09-17: qty 30

## 2013-09-17 MED ORDER — FENTANYL CITRATE 0.05 MG/ML IJ SOLN
INTRAMUSCULAR | Status: AC
  Filled 2013-09-17: qty 2

## 2013-09-17 MED ORDER — FENTANYL CITRATE 0.05 MG/ML IJ SOLN
INTRAMUSCULAR | Status: DC | PRN
  Administered 2013-09-17 (×2): 25 ug via INTRAVENOUS
  Administered 2013-09-17: 50 ug via INTRAVENOUS

## 2013-09-17 MED ORDER — MUPIROCIN 2 % EX OINT
1.0000 "application " | TOPICAL_OINTMENT | Freq: Two times a day (BID) | CUTANEOUS | Status: DC
  Administered 2013-09-17 – 2013-09-20 (×6): 1 via NASAL
  Filled 2013-09-17 (×3): qty 22

## 2013-09-17 MED ORDER — FENTANYL CITRATE 0.05 MG/ML IJ SOLN: 25.0000 ug | INTRAMUSCULAR | Status: DC | PRN

## 2013-09-17 MED ORDER — VANCOMYCIN HCL 10 G IV SOLR
1250.0000 mg | Freq: Three times a day (TID) | INTRAVENOUS | Status: DC
  Administered 2013-09-17 – 2013-09-18 (×3): 1250 mg via INTRAVENOUS
  Filled 2013-09-17 (×9): qty 1250

## 2013-09-17 MED ORDER — MIDAZOLAM HCL 2 MG/2ML IJ SOLN
1.0000 mg | INTRAMUSCULAR | Status: DC | PRN
Start: 2013-09-17 — End: 2013-09-17
  Administered 2013-09-17: 2 mg via INTRAVENOUS

## 2013-09-17 MED ORDER — PROPOFOL INFUSION 10 MG/ML OPTIME
INTRAVENOUS | Status: DC | PRN
  Administered 2013-09-17: 200 ug/kg/min via INTRAVENOUS

## 2013-09-17 MED ORDER — LACTATED RINGERS IV SOLN
INTRAVENOUS | Status: DC
  Administered 2013-09-17: 13:00:00 via INTRAVENOUS

## 2013-09-17 MED ORDER — ONDANSETRON HCL 4 MG/2ML IJ SOLN: 4.0000 mg | Freq: Once | INTRAMUSCULAR | Status: DC | PRN

## 2013-09-17 MED ORDER — PROPOFOL 10 MG/ML IV EMUL
INTRAVENOUS | Status: AC
  Filled 2013-09-17: qty 20

## 2013-09-17 MED ORDER — ONDANSETRON HCL 4 MG/2ML IJ SOLN
4.0000 mg | Freq: Once | INTRAMUSCULAR | Status: AC
  Administered 2013-09-17: 4 mg via INTRAVENOUS

## 2013-09-17 MED ORDER — MIDAZOLAM HCL 2 MG/2ML IJ SOLN
INTRAMUSCULAR | Status: AC
  Filled 2013-09-17: qty 2

## 2013-09-17 MED ORDER — LIDOCAINE HCL (PF) 1 % IJ SOLN
INTRAMUSCULAR | Status: AC
  Filled 2013-09-17: qty 5

## 2013-09-17 MED ORDER — 0.9 % SODIUM CHLORIDE (POUR BTL) OPTIME
TOPICAL | Status: DC | PRN
  Administered 2013-09-17: 1000 mL

## 2013-09-17 MED ORDER — BUPIVACAINE HCL (PF) 0.5 % IJ SOLN
INTRAMUSCULAR | Status: DC | PRN
  Administered 2013-09-17: 20 mL

## 2013-09-17 MED ORDER — LIDOCAINE HCL (CARDIAC) 10 MG/ML IV SOLN
INTRAVENOUS | Status: DC | PRN
  Administered 2013-09-17: 10 mg via INTRAVENOUS

## 2013-09-17 MED ORDER — ONDANSETRON HCL 4 MG/2ML IJ SOLN
INTRAMUSCULAR | Status: AC
  Filled 2013-09-17: qty 2

## 2013-09-17 SURGICAL SUPPLY — 44 items
BAG HAMPER (MISCELLANEOUS) ×3 IMPLANT
BANDAGE ELASTIC 4 VELCRO NS (GAUZE/BANDAGES/DRESSINGS) ×3 IMPLANT
BANDAGE ESMARK 4X12 BL STRL LF (DISPOSABLE) ×1 IMPLANT
BLADE AVERAGE 25MMX9MM (BLADE)
BLADE AVERAGE 25X9 (BLADE) IMPLANT
BNDG CMPR 12X4 ELC STRL LF (DISPOSABLE)
BNDG CONFORM 2 STRL LF (GAUZE/BANDAGES/DRESSINGS) ×3 IMPLANT
BNDG ESMARK 4X12 BLUE STRL LF (DISPOSABLE)
BNDG GAUZE ELAST 4 BULKY (GAUZE/BANDAGES/DRESSINGS) ×3 IMPLANT
CLOTH BEACON ORANGE TIMEOUT ST (SAFETY) ×3 IMPLANT
COVER LIGHT HANDLE STERIS (MISCELLANEOUS) ×6 IMPLANT
CUFF TOURNIQUET SINGLE 18IN (TOURNIQUET CUFF) ×3 IMPLANT
DECANTER SPIKE VIAL GLASS SM (MISCELLANEOUS) ×3 IMPLANT
DRSG ADAPTIC 3X8 NADH LF (GAUZE/BANDAGES/DRESSINGS) ×3 IMPLANT
ELECT REM PT RETURN 9FT ADLT (ELECTROSURGICAL) ×3
ELECTRODE REM PT RTRN 9FT ADLT (ELECTROSURGICAL) ×1 IMPLANT
GAUZE SPONGE 4X4 12PLY STRL (GAUZE/BANDAGES/DRESSINGS) ×2 IMPLANT
GLOVE BIO SURGEON STRL SZ7.5 (GLOVE) ×3 IMPLANT
GLOVE BIOGEL PI IND STRL 7.0 (GLOVE) IMPLANT
GLOVE BIOGEL PI IND STRL 7.5 (GLOVE) IMPLANT
GLOVE BIOGEL PI INDICATOR 7.0 (GLOVE) ×2
GLOVE BIOGEL PI INDICATOR 7.5 (GLOVE) ×2
GLOVE EXAM NITRILE PF LG BLUE (GLOVE) ×2 IMPLANT
GLOVE SS BIOGEL STRL SZ 6.5 (GLOVE) IMPLANT
GLOVE SUPERSENSE BIOGEL SZ 6.5 (GLOVE) ×2
GOWN STRL REUS W/TWL LRG LVL3 (GOWN DISPOSABLE) ×6 IMPLANT
KIT ROOM TURNOVER APOR (KITS) ×3 IMPLANT
MANIFOLD NEPTUNE II (INSTRUMENTS) ×3 IMPLANT
NDL HYPO 27GX1-1/4 (NEEDLE) ×2 IMPLANT
NEEDLE HYPO 27GX1-1/4 (NEEDLE) ×6 IMPLANT
NS IRRIG 1000ML POUR BTL (IV SOLUTION) ×3 IMPLANT
PACK BASIC LIMB (CUSTOM PROCEDURE TRAY) ×3 IMPLANT
PAD ARMBOARD 7.5X6 YLW CONV (MISCELLANEOUS) ×3 IMPLANT
RASP SM TEAR CROSS CUT (RASP) IMPLANT
SET BASIN LINEN APH (SET/KITS/TRAYS/PACK) ×3 IMPLANT
SOL PREP PROV IODINE SCRUB 4OZ (MISCELLANEOUS) ×3 IMPLANT
SPONGE GAUZE 4X4 12PLY (GAUZE/BANDAGES/DRESSINGS) ×2 IMPLANT
SPONGE LAP 18X18 X RAY DECT (DISPOSABLE) ×3 IMPLANT
SUT ETHILON 4 0 PS 2 18 (SUTURE) IMPLANT
SUT PROLENE 4 0 PS 2 18 (SUTURE) ×5 IMPLANT
SUT VIC AB 4-0 PS2 27 (SUTURE) IMPLANT
SWAB COLLECTION DEVICE MRSA (MISCELLANEOUS) ×2 IMPLANT
SYRINGE CONTROL L 12CC (SYRINGE) ×6 IMPLANT
SYRINGE CONTROL LL 12CC (SYRINGE) ×2 IMPLANT

## 2013-09-17 NOTE — Anesthesia Preprocedure Evaluation (Signed)
Anesthesia Evaluation  Patient identified by MRN, date of birth, ID band Patient awake    Reviewed: Allergy & Precautions, H&P , NPO status , Patient's Chart, lab work & pertinent test results  Airway Mallampati: II TM Distance: >3 FB     Dental  (+) Teeth Intact   Pulmonary Current Smoker,  breath sounds clear to auscultation        Cardiovascular negative cardio ROS  Rhythm:Regular Rate:Normal     Neuro/Psych Seizures -, Well Controlled,  PSYCHIATRIC DISORDERS Schizophrenia    CVA    GI/Hepatic negative GI ROS, (+) Hepatitis -, C  Endo/Other    Renal/GU      Musculoskeletal   Abdominal   Peds  Hematology   Anesthesia Other Findings   Reproductive/Obstetrics                           Anesthesia Physical Anesthesia Plan  ASA: III  Anesthesia Plan: MAC   Post-op Pain Management:    Induction: Intravenous  Airway Management Planned: Simple Face Mask  Additional Equipment:   Intra-op Plan:   Post-operative Plan:   Informed Consent: I have reviewed the patients History and Physical, chart, labs and discussed the procedure including the risks, benefits and alternatives for the proposed anesthesia with the patient or authorized representative who has indicated his/her understanding and acceptance.     Plan Discussed with:   Anesthesia Plan Comments:         Anesthesia Quick Evaluation

## 2013-09-17 NOTE — Anesthesia Postprocedure Evaluation (Signed)
  Anesthesia Post-op Note  Patient: Sylvia [name]  Procedure(s) Performed: Procedure(s): PARTIAL AMPUTATION 2ND TOE RIGHT FOOT (Right)  Patient Location: PACU  Anesthesia Type:MAC  Level of Consciousness: awake, alert , oriented and patient cooperative  Airway and Oxygen Therapy: Patient Spontanous Breathing and Patient connected to face mask oxygen  Post-op Pain: none  Post-op Assessment: Post-op Vital signs reviewed, Patient's Cardiovascular Status Stable, Respiratory Function Stable, Patent Airway and Pain level controlled  Post-op Vital Signs: Reviewed and stable  Last Vitals:  Filed Vitals:   09/17/13 1344  BP: 102/51  Pulse:   Temp: 36.4 C  Resp: 12    Complications: No apparent anesthesia complications
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
## 2013-09-17 NOTE — Op Note (Signed)
OPERATIVE NOTE  DATE OF PROCEDURE:  09/17/2013  SURGEON:   Dallas SchimkeBenjamin Ivan Keira Bohlin, DPM  OR STAFF:   Circulator: Rogene Houstonina Sabrina Talbott, RN Scrub Person: Hurshel PartyAngela Maria Witt, CST Circulator Assistant: Lizabeth LeydenBetty M Ashley, RN; [facility] Renee Protzek, RN   PREOPERATIVE DIAGNOSIS:   1.  Osteomyelitis of the second toe, right foot (ICD-9 730.27) 2.  Ulcer of the second toe, right foot (ICD-9 707.15)  POSTOPERATIVE DIAGNOSIS: Same  PROCEDURE: Partial amputation of the second toe, right foot (CPT [id number])  ANESTHESIA:  Monitor Anesthesia Care   HEMOSTASIS:   Pneumatic ankle tourniquet set at 250 mmHg  ESTIMATED BLOOD LOSS:   Minimal (<5 cc)  MATERIALS USED:  None  INJECTABLES: 0.5% Marcaine plain  PATHOLOGY:   1.  Aerobic culture 2.  Anaerobic culture 3.  Distal aspect of the second toe, right foot  COMPLICATIONS:   None  INDICATIONS:  The patient was admitted with an ulceration of the second toe of the right foot.  MRI findings were consistent with osteomyelitis of the distal phalanx of the second toe of the right foot.  Partial amputation of the second toe of the right foot was recommended.  DESCRIPTION OF THE PROCEDURE:   The patient was brought to the operating room and placed on the operative table in the supine position.  A pneumatic ankle tourniquet was placed about the patient's right ankle.  The foot was anesthetized with 0.5% Marcaine plain.  The foot was scrubbed prepped and draped in the usual sterile manner.  The limb was then elevated and the pneumatic ankle tourniquet inflated to 250 mmHg.  Attention was directed to the distal aspect of the right second toe where a full-thickness ulceration was encountered.  The distal phalanx was palpated through the ulceration.  An anaerobic and aerobic culture were performed.  2 converging semi-elliptical incisions were made encompassing the distal aspect of the toe circumferentially.  Dissection was continued deep down to the level of  the distal interphalangeal joint.  The distal interphalangeal joint was disarticulated.  The distal aspect of the toe was passed from the operative field and sent to pathology for evaluation.  The wound was irrigated with copious amounts of sterile irrigant.  The skin was reapproximated using 4-0 Prolene in a simple suture technique.  A sterile compressive dressing was applied to the right foot.  The pneumatic ankle tourniquet was deflated and a prompt hyperemic response was noted to all digits of the right foot.  The patient tolerated the procedure and anesthesia well.  She was transferred from the operating room to the postanesthesia care unit with vital signs stable and vascular status intact to all digits of the right foot.

## 2013-09-17 NOTE — Anesthesia Procedure Notes (Signed)
Procedure Name: MAC Date/Time: 09/17/2013 12:56 PM Performed by: Franco NonesYATES, Ailani Governale S Pre-anesthesia Checklist: Patient identified, Emergency Drugs available, Suction available, Timeout performed and Patient being monitored Patient Re-evaluated:Patient Re-evaluated prior to inductionOxygen Delivery Method: Non-rebreather mask

## 2013-09-17 NOTE — Progress Notes (Signed)
CRITICAL VALUE ALERT  Critical value received:  Staph aureus positive  Date of notification:  09/17/2013  Time of notification:  1044  Critical value read back:Yes.    Nurse who received alert:  Fara ChuteMary Beth Selam Pietsch, RN  MD notified (1st page):  Dr. Rito EhrlichKrishnan  Time of first page:  10:50 a.m.  MD notified (2nd page):  Time of second page:  Responding MD:   Time MD responded:

## 2013-09-17 NOTE — Progress Notes (Addendum)
ANTIBIOTIC CONSULT NOTE  Pharmacy Consult for Vancomycin Indication: cellulitis  No Known Allergies  Patient Measurements: Height: 5\' 4"  (162.6 cm) Weight: 155 lb 12.8 oz (70.67 kg) IBW/kg (Calculated) : 54.7  Vital Signs: Temp: 98.3 F (36.8 C) (07/31 0643) Temp src: Oral (07/31 0643) BP: 104/63 mmHg (07/31 0643) Pulse Rate: 60 (07/31 0643) Intake/Output from previous day: 07/30 0701 - 07/31 0700 In: 680 [P.O.:480; IV Piggyback:200] Out: 250 [Urine:250] Intake/Output from this shift:    Labs:  Recent Labs  09/14/13 1820 09/15/13 0520 09/16/13 0511 09/17/13 0531  WBC 9.2 7.9 5.0  --   HGB 13.6 12.7 12.3  --   PLT 261 238 243  --   CREATININE 0.65 0.59 0.60 0.62   Estimated Creatinine Clearance: 100.1 ml/min (by C-G formula based on Cr of 0.62).  Recent Labs  09/17/13 0922  VANCOTROUGH 8.5*     Microbiology: Recent Results (from the past 720 hour(s))  SURGICAL PCR SCREEN     Status: Abnormal   Collection Time    09/17/13  7:07 AM      Result Value Ref Range Status   MRSA, PCR NEGATIVE  NEGATIVE Final   Staphylococcus aureus POSITIVE (*) NEGATIVE Final   Comment:            The Xpert SA Assay (FDA     approved for NASAL specimens     in patients over 30 years of age),     is one component of     a comprehensive surveillance     program.  Test performance has     been validated by [facility] for patients greater     than or equal to 30 year old.     It is not intended     to diagnose infection nor to     guide or monitor treatment.     RESULT CALLED TO, READ BACK BY AND VERIFIED WITH:     HAWKINS,M AT 1042 BY HUFFINES,S ON 09/17/13    Medical History: Past Medical History  Diagnosis Date  . CP (cerebral palsy)   . Hepatitis C   . Herpes   . Stroke   . Seizures   . Schizoaffective schizophrenia 09/17/2013    [facility]     Medications:  Scheduled:  . divalproex  250 mg Oral Daily  . naproxen  500 mg Oral BID  .  vancomycin  1,000 mg Intravenous Q12H   Assessment: 28 yo F with cerebal palsy who lives in a group home.  She presents with worsening pain/swelling/redness of right 2nd toe.  She remains afebrile with normal WBC.   MRI of toe +osteomyelitis.  Podiatry recommends toe amputation. Renal function has been stable. Vancomycin trough below desired goal range.   Vancomycin 7/29>> Clindamycin 7/28>>7/29  Goal of Therapy:  Vancomycin trough 15-20 mcg/ml  Plan:  Increase Vancomycin 1250mg  IV q8h Check Vancomycin trough at steady state Monitor renal function and cx data  **Consider adding Zosyn for anaerobic & GN coverage until cx data available.   Elson ClanLilliston, Marlon Suleiman Michelle 09/17/2013,11:17 AM
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
## 2013-09-17 NOTE — Transfer of Care (Signed)
Immediate Anesthesia Transfer of Care Note  Patient: Sylvia [name]  Procedure(s) Performed: Procedure(s): PARTIAL AMPUTATION 2ND TOE RIGHT FOOT (Right)  Patient Location: PACU  Anesthesia Type:MAC  Level of Consciousness: awake and patient cooperative  Airway & Oxygen Therapy: Patient Spontanous Breathing and Patient connected to face mask oxygen  Post-op Assessment: Report given to PACU RN, Post -op Vital signs reviewed and stable and Patient moving all extremities  Post vital signs: Reviewed and stable  Complications: No apparent anesthesia complications

## 2013-09-18 LAB — BASIC METABOLIC PANEL WITH GFR
Anion gap: 9 (ref 5–15)
BUN: 10 mg/dL (ref 6–23)
CO2: 26 meq/L (ref 19–32)
Calcium: 9 mg/dL (ref 8.4–10.5)
Chloride: 105 meq/L (ref 96–112)
Creatinine, Ser: 0.62 mg/dL (ref 0.50–1.10)
GFR calc Af Amer: >90 mL/min
GFR calc non Af Amer: >90 mL/min
Glucose, Bld: 89 mg/dL (ref 70–99)
Potassium: 4.2 meq/L (ref 3.7–5.3)
Sodium: 140 meq/L (ref 137–147)

## 2013-09-18 LAB — CBC
HCT: 35.9 % — ABNORMAL LOW (ref 36.0–46.0)
Hemoglobin: 12.3 g/dL (ref 12.0–15.0)
MCH: 36.8 pg — ABNORMAL HIGH (ref 26.0–34.0)
MCHC: 34.3 g/dL (ref 30.0–36.0)
MCV: 107.5 fL — ABNORMAL HIGH (ref 78.0–100.0)
Platelets: 296 10*3/uL (ref 150–400)
RBC: 3.34 MIL/uL — ABNORMAL LOW (ref 3.87–5.11)
RDW: 12 % (ref 11.5–15.5)
WBC: 5.5 10*3/uL (ref 4.0–10.5)

## 2013-09-18 MED ORDER — SULFAMETHOXAZOLE-TMP DS 800-160 MG PO TABS
1.0000 | ORAL_TABLET | Freq: Two times a day (BID) | ORAL | Status: DC
  Administered 2013-09-18 – 2013-09-20 (×5): 1 via ORAL
  Filled 2013-09-18 (×5): qty 1

## 2013-09-18 MED ORDER — OXYCODONE-ACETAMINOPHEN 5-325 MG PO TABS
1.0000 | ORAL_TABLET | ORAL | Status: DC | PRN
  Administered 2013-09-18 – 2013-09-20 (×12): 2 via ORAL
  Filled 2013-09-18 (×13): qty 2
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
## 2013-09-18 NOTE — Anesthesia Postprocedure Evaluation (Signed)
  Anesthesia Post-op Note  Patient: Sylvia [name]  Procedure(s) Performed: Procedure(s): PARTIAL AMPUTATION 2ND TOE RIGHT FOOT (Right)  Patient Location: room 303  Anesthesia Type:MAC  Level of Consciousness: awake, alert , oriented and patient cooperative  Airway and Oxygen Therapy: Patient Spontanous Breathing and Patient connected to nasal cannula oxygen  Post-op Pain: 1 /10, mild  Post-op Assessment: Post-op Vital signs reviewed, Patient's Cardiovascular Status Stable, Respiratory Function Stable, Patent Airway, No signs of Nausea or vomiting, Adequate PO intake and Pain level controlled  Post-op Vital Signs: Reviewed and stable  Last Vitals:  Filed Vitals:   09/18/13 0554  BP: 112/64  Pulse: 56  Temp: 36.9 C  Resp: 18    Complications: No apparent anesthesia complications

## 2013-09-18 NOTE — Addendum Note (Signed)
Addendum created 09/18/13 1804 by Despina Hiddenobert J Dmari Schubring, CRNA   Modules edited: Notes Section   Notes Section:  File: [id number]

## 2013-09-18 NOTE — Consult Note (Signed)
Podiatry Progress Note  Subjective: Sylvia [name] is postoperative day 1 following partial amputation of the right second toe.  She has experienced some pain.  Her pain medication has been changed.  She denies any nausea, vomiting, fever or chills.   Past Medical History  Diagnosis Date  . CP (cerebral palsy)   . Hepatitis C   . Herpes   . Stroke   . Seizures   . Schizoaffective schizophrenia 09/17/2013    [facility]    Scheduled Meds: . Chlorhexidine Gluconate Cloth  6 each Topical Q0600  . divalproex  250 mg Oral Daily  . mupirocin ointment  1 application Nasal BID  . naproxen  500 mg Oral BID  . sulfamethoxazole-trimethoprim  1 tablet Oral Q12H   Continuous Infusions:  PRN Meds:.nicotine polacrilex, oxyCODONE-acetaminophen  No Known Allergies Past Surgical History  Procedure Laterality Date  . Leg surgery Right     x7  . Amputation Right 09/17/2013    Procedure: PARTIAL AMPUTATION 2ND TOE RIGHT FOOT;  Surgeon: Dallas SchimkeBenjamin Ivan Holle Sprick, DPM;  Location: [facility];  Service: Podiatry;  Laterality: Right;   Family History  Problem Relation Age of Onset  . Bipolar disorder Mother    Social History:  reports that she has been smoking Cigarettes.  She has a 14 pack-year smoking history. She has quit using smokeless tobacco. She reports that she drinks alcohol. She reports that she does not use illicit drugs.  Physical Examination: Vital signs in last 24 hours:   Temp:  [97.5 F (36.4 C)-98.4 F (36.9 C)] 98.4 F (36.9 C) (08/01 0554) Pulse Rate:  [56-61] 56 (08/01 0554) Resp:  [12-18] 18 (08/01 0554) BP: (91-122)/(51-64) 112/64 mmHg (08/01 0554) SpO2:  [95 %-100 %] 100 % (08/01 0554)  A surgical dressing is clean, dry and intact on the right foot.  Incision is well approximated with sutures in place.  There is rubor along the distal aspect of the right second toe which is normal for the postoperative course.  There are no ascending signs of cellulitis.  Pedal pulses  are palpable.  Calves are soft and nontender.  Lab/Test Results:    Recent Labs  09/16/13 0511 09/17/13 0531 09/18/13 0550  WBC 5.0  --  5.5  HGB 12.3  --  12.3  HCT 35.2*  --  35.9*  PLT 243  --  296  NA 142 140 140  K 4.4 4.3 4.2  CL 105 104 105  CO2 26 25 26   BUN 9 9 10   CREATININE 0.60 0.62 0.62  GLUCOSE 92 102* 89  CALCIUM 9.2 9.3 9.0    Recent Results (from the past 240 hour(s))  SURGICAL PCR SCREEN     Status: Abnormal   Collection Time    09/17/13  7:07 AM      Result Value Ref Range Status   MRSA, PCR NEGATIVE  NEGATIVE Final   Staphylococcus aureus POSITIVE (*) NEGATIVE Final   Comment:            The Xpert SA Assay (FDA     approved for NASAL specimens     in patients over 30 years of age),     is one component of     a comprehensive surveillance     program.  Test performance has     been validated by [facility] for patients greater     than or equal to 208 year old.  It is not intended     to diagnose infection nor to     guide or monitor treatment.     RESULT CALLED TO, READ BACK BY AND VERIFIED WITH:     HAWKINS,M AT 1042 BY HUFFINES,S ON 09/17/13     Dg Foot Complete Right  09/17/2013   CLINICAL DATA:  Postop.  EXAM: RIGHT FOOT COMPLETE - 3+ VIEW  COMPARISON:  September 14, 2013.  FINDINGS: Status post surgical amputation of the second distal phalanx. No other fracture or dislocation is noted. 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 No radiopaque foreign body is noted.  IMPRESSION: Status post surgical amputation of the second distal phalanx.   Electronically Signed   By: Roque Lias M.D.   On: 09/17/2013 14:16    Assessment: Status post partial amputation of the right second toe  Plan: A sterile dressing was like the right second toe.  She is to remain nonweightbearing on her right foot.  Continue antibiotics per primary team.  Cultures are pending.  Sylvia [name] IVAN 09/18/2013, 12:12 PM

## 2013-09-19 MED ORDER — DOCUSATE SODIUM 100 MG PO CAPS
100.0000 mg | ORAL_CAPSULE | Freq: Two times a day (BID) | ORAL | Status: DC
  Administered 2013-09-19 – 2013-09-20 (×3): 100 mg via ORAL
  Filled 2013-09-19 (×3): qty 1

## 2013-09-19 MED ORDER — POLYETHYLENE GLYCOL 3350 17 G PO PACK: 17.0000 g | PACK | Freq: Every day | ORAL | Status: DC | PRN

## 2013-09-19 MED ORDER — ENOXAPARIN SODIUM 40 MG/0.4ML ~~LOC~~ SOLN
40.0000 mg | Freq: Every day | SUBCUTANEOUS | Status: DC
  Administered 2013-09-19 – 2013-09-20 (×2): 40 mg via SUBCUTANEOUS
  Filled 2013-09-19 (×2): qty 0.4

## 2013-09-19 NOTE — Evaluation (Signed)
Physical Therapy Evaluation Patient Details Name: Sylvia [name] MRN: [id number] DOB: [birth year] Today's Date: 09/19/2013   History of Present Illness  The patient was admitted with an ulceration of the second toe of the right foot.  MRI findings were consistent with osteomyelitis of the distal phalanx of the second toe of the right foot.  Partial amputation of the second toe of the right foot was recommended and completed on 09/17/2013.Lives in group home. She is a ward of the state. Her guardian is [facility]via [name] with [facility]    Clinical Impression  PT is very I in mobility but tends to put weight on Rt forefoot.  Pt states she can not walk on her heel but was able to demonstrate proper gait using walker and darco boot for several steps.  PT will need continued skilled care to educate in proper gt staying NWB on surgical site.    Follow Up Recommendations No PT follow up    Equipment Recommendations  Rolling walker with 5" wheels       Precautions / Restrictions Precautions Precautions: None Restrictions Weight Bearing Restrictions: No Other Position/Activity Restrictions: No weight on incison area.  Pt has been given a Darco shoe with weightbearing on heel only.      Mobility  Bed Mobility Overal bed mobility: Independent                Transfers Overall transfer level: Modified independent Equipment used: Rolling walker (2 wheeled)                Ambulation/Gait Ambulation/Gait assistance: Modified independent (Device/Increase time) Ambulation Distance (Feet): 100 Feet Assistive device: Rolling walker (2 wheeled) Gait Pattern/deviations:  (Pt acutally taking to long of steps needs cuing to decrease stride to improve safety)   Gait velocity interpretation: at or above normal speed for age/gender                Pertinent Vitals/Pain Pt states with medication pain is under control 0/10    Home Living Family/patient expects to be discharged  to:: Group home                      Prior Function Level of Independence: Independent               Extremity/Trunk Assessment               Lower Extremity Assessment: Overall WFL for tasks assessed         Communication   Communication: No difficulties  Cognition Arousal/Alertness: Awake/alert   Overall Cognitive Status: Within Functional Limits for tasks assessed                               Assessment/Plan    PT Assessment Patient needs continued PT services  PT Diagnosis Difficulty walking   PT Problem List Decreased knowledge of precautions  PT Treatment Interventions Gait training   PT Goals (Current goals can be found in the Care Plan section)      Frequency Min 3X/week           End of Session Equipment Utilized During Treatment: Gait belt Activity Tolerance: Patient tolerated treatment well Patient left: in chair;with call bell/phone within reach;with chair alarm set           Time: [id number] PT Time Calculation (min): 24 min   Charges:   PT Evaluation $Initial PT Evaluation Tier I:  1 Procedure     PT G Codes:          RUSSELL,CINDY 10/05/2013, 12:19 PM

## 2013-09-19 NOTE — Consult Note (Signed)
Podiatry Progress Note  Subjective: Sylvia [name] is postoperative day 2 following partial amputation of her right second toe.  Her pain is improved today.  She denies any nausea, vomiting, fever or chills.   Past Medical History  Diagnosis Date  . CP (cerebral palsy)   . Hepatitis C   . Herpes   . Stroke   . Seizures   . Schizoaffective schizophrenia 09/17/2013    Per notes from [facility] as on 2014    Scheduled Meds: . Chlorhexidine Gluconate Cloth  6 each Topical Q0600  . divalproex  250 mg Oral Daily  . docusate sodium  100 mg Oral BID  . enoxaparin (LOVENOX) injection  40 mg Subcutaneous Daily  . mupirocin ointment  1 application Nasal BID  . naproxen  500 mg Oral BID  . sulfamethoxazole-trimethoprim  1 tablet Oral Q12H   Continuous Infusions:  PRN Meds:.nicotine polacrilex, oxyCODONE-acetaminophen, polyethylene glycol  No Known Allergies Past Surgical History  Procedure Laterality Date  . Leg surgery Right     x7  . Amputation Right 09/17/2013    Procedure: PARTIAL AMPUTATION 2ND TOE RIGHT FOOT;  Surgeon: Dallas Schimke, DPM;  Location: [facility];  Service: Podiatry;  Laterality: Right;   Family History  Problem Relation Age of Onset  . Bipolar disorder Mother    Social History:  reports that she has been smoking Cigarettes.  She has a 14 pack-year smoking history. She has quit using smokeless tobacco. She reports that she drinks alcohol. She reports that she does not use illicit drugs.  Physical Examination: Vital signs in last 24 hours:   Temp:  [98.1 F (36.7 C)-98.6 F (37 C)] 98.6 F (37 C) (08/02 0530) Pulse Rate:  [54-61] 54 (08/02 0530) Resp:  [18-20] 20 (08/02 0530) BP: (119-128)/(73-89) 119/73 mmHg (08/02 0530) SpO2:  [99 %-100 %] 99 % (08/02 0530)  Her dressing is clean, dry and intact.  Incision is well approximated with sutures in place.  The erythema the distal aspect of the right second toe is improved.  Pedal pulses are palpable.  Calves are soft and  nontender.  Lab/Test Results:    Recent Labs  09/17/13 0531 09/18/13 0550  WBC  --  5.5  HGB  --  12.3  HCT  --  35.9*  PLT  --  296  NA 140 140  K 4.3 4.2  CL 104 105  CO2 25 26  BUN 9 10  CREATININE 0.62 0.62  GLUCOSE 102* 89  CALCIUM 9.3 9.0    Recent Results (from the past 240 hour(s))  SURGICAL PCR SCREEN     Status: Abnormal   Collection Time    09/17/13  7:07 AM      Result Value Ref Range Status   MRSA, PCR NEGATIVE  NEGATIVE Final   Staphylococcus aureus POSITIVE (*) NEGATIVE Final   Comment:            The Xpert SA Assay (FDA     approved for NASAL specimens     in patients over 25 years of age),     is one component of     a comprehensive surveillance     program.  Test performance has     been validated by [employer] for patients greater     than or equal to 29 year old.     It is not intended     to diagnose infection nor to     guide or monitor  treatment.     RESULT CALLED TO, READ BACK BY AND VERIFIED WITH:     HAWKINS,M AT 1042 BY HUFFINES,S ON 09/17/13  WOUND CULTURE     Status: None   Collection Time    09/17/13  1:20 PM      Result Value Ref Range Status   Specimen Description FOOT RIGHT   Final   Special Requests NONE   Final   Gram Stain     Final   Value: FEW WBC PRESENT,BOTH PMN AND MONONUCLEAR     NO SQUAMOUS EPITHELIAL CELLS SEEN     FEW GRAM POSITIVE COCCI IN PAIRS     IN CLUSTERS     Performed at [facility]   Culture     Final   Value: MODERATE STAPHYLOCOCCUS AUREUS     Note: RIFAMPIN AND GENTAMICIN SHOULD NOT BE USED AS SINGLE DRUGS FOR TREATMENT OF STAPH INFECTIONS.     Performed at [facility]   Report Status PENDING   Incomplete  ANAEROBIC CULTURE     Status: None   Collection Time    09/17/13  1:20 PM      Result Value Ref Range Status   Specimen Description FOOT RIGHT   Final   Special Requests NONE   Final   Gram Stain     Final   Value: FEW WBC PRESENT,BOTH PMN AND MONONUCLEAR     NO  SQUAMOUS EPITHELIAL CELLS SEEN     FEW GRAM POSITIVE COCCI IN PAIRS     IN CLUSTERS     Performed at [facility]   Culture     Final   Value: NO ANAEROBES ISOLATED; CULTURE IN PROGRESS FOR 5 DAYS     Performed at [facility]   Report Status PENDING   Incomplete     Dg Foot Complete Right  09/17/2013   CLINICAL DATA:  Postop.  EXAM: RIGHT FOOT COMPLETE - 3+ VIEW  COMPARISON:  September 14, 2013.  FINDINGS: Status post surgical amputation of the second distal phalanx. No other fracture or dislocation is noted. 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 No radiopaque foreign body is noted.  IMPRESSION: Status post surgical amputation of the second distal phalanx.   Electronically Signed   By: Roque LiasJames  Green M.D.   On: 09/17/2013 14:16    Assessment: Status post partial amputation of the right second toe  Plan: A sterile dressing was applied to the right foot.  She was instructed to leave the dressing clean, dry and intact.  She is cleared from discharge from my standpoint.  Antibiotics per infectious disease.  She is to follow up with me in my [place] office on 10/01/2013 at 4:00 PM.  Sylvia [name] IVAN 09/19/2013, 1:14 PM

## 2013-09-19 NOTE — Progress Notes (Signed)
[facility] PROGRESS NOTE Interim History: 27yo with history of seizures, CP and herpes who presented with pain in right 2nd toe for a week. Admitted with cellulitis. Found to have osteomyelitis and small abscess right 2nd toe.  Assessment/Plan:  Osteomyelitis/Cellulitis and abscess of toe of right foot/Staph aureus She is now s/p partial amputation of the toe. She was changed over to oral antibiotics yesterday. Discussed with Dr. Orvan Falconerampbell with ID. Preliminary results from culture show Staph aureus. Oral antibiotics should be sufficient considering the area of concern has been removed. Continue Bactrim for now. Appreciate Dr. Loralie ChampagneMckinney's assistance with this case.   Seizure disorder: Continue Depakote.  Social Situation  Lives in group home. She is a ward of the state. Her guardian is Lenor [facility]anda [facility]. CSW following for this issue. Will be going to another facility.  CP (cerebral palsy) Stable.    Code Status: Full Code  DVT Prophylaxis: Resume Lovenox.  Family Communication: Discussed with patient.  Disposition Plan: Anticipate discharge in 1-2 days.   Consultants:  Podiatrist  Procedures:  S/p amputation on 2 nd right toe  Antibiotics:  vanc till 8/1  Bactrim 8/1-->  HPI/Subjective: Patient's pain is better. No new complaints.   Objective: Filed Vitals:   09/18/13 0554 09/18/13 1425 09/18/13 2052 09/19/13 0530  BP: 112/64 121/74 128/89 119/73  Pulse: 56 60 61 54  Temp: 98.4 F (36.9 C) 98.1 F (36.7 C) 98.5 F (36.9 C) 98.6 F (37 C)  TempSrc: Oral  Oral Oral  Resp: 18 18 20 20   Height:      Weight:      SpO2: 100% 100%  99%    Intake/Output Summary (Last 24 hours) at 09/19/13 0752 Last data filed at 09/19/13 0534  Gross per 24 hour  Intake    960 ml  Output   1600 ml  Net   -640 ml   Filed Weights   09/14/13 1342 09/14/13 2128 09/17/13 1138  Weight: 70.308 kg (155 lb) 70.67 kg (155 lb 12.8 oz) 70.308 kg (155 lb)     Exam:  General: Alert, awake, oriented x3, in no acute distress.  Heart: Regular rate and rhythm. No rubs murmurs or bruits. Lungs: Good air movement, bilateral air movement. No crackles or wheezing. Abdomen: Soft, NT, BS present Right Foot: covered in dressing  Data Reviewed: Basic Metabolic Panel:  Recent Labs Lab 09/14/13 1820 09/15/13 0520 09/16/13 0511 09/17/13 0531 09/18/13 0550  NA 134* 134* 142 140 140  K 4.5 3.9 4.4 4.3 4.2  CL 95* 96 105 104 105  CO2 26 26 26 25 26   GLUCOSE 96 86 92 102* 89  BUN 6 6 9 9 10   CREATININE 0.65 0.59 7.820.60 0.62 0.62  CALCIUM 9.3 8.7 9.2 9.3 9.0   Liver Function Tests:  Recent Labs Lab 09/15/13 0513  AST 14  ALT 8  ALKPHOS 67  BILITOT 0.3  PROT 6.8  ALBUMIN 3.4*   CBC:  Recent Labs Lab 09/14/13 1820 09/15/13 0520 09/16/13 0511 09/18/13 0550  WBC 9.2 7.9 5.0 5.5  NEUTROABS 6.3  --   --   --   HGB 13.6 12.7 12.3 12.3  HCT 40.0 37.5 35.2* 35.9*  MCV 108.7* 108.1* 107.6* 107.5*  PLT 261 238 243 296     Recent Results (from the past 240 hour(s))  SURGICAL PCR SCREEN     Status: Abnormal   Collection Time    09/17/13  7:07 AM  Result Value Ref Range Status   MRSA, PCR NEGATIVE  NEGATIVE Final   Staphylococcus aureus POSITIVE (*) NEGATIVE Final   Comment:            The Xpert SA Assay (FDA     approved for NASAL specimens     in patients over 13 years of age),     is one component of     a comprehensive surveillance     program.  Test performance has     been validated by [employer] for patients greater     than or equal to 18 year old.     It is not intended     to diagnose infection nor to     guide or monitor treatment.     RESULT CALLED TO, READ BACK BY AND VERIFIED WITH:     HAWKINS,M AT 1042 BY HUFFINES,S ON 10-02-2013  WOUND CULTURE     Status: None   Collection Time    10-02-2013  1:20 PM      Result Value Ref Range Status   Specimen Description FOOT RIGHT   Final   Special Requests NONE    Final   Gram Stain     Final   Value: FEW WBC PRESENT,BOTH PMN AND MONONUCLEAR     NO SQUAMOUS EPITHELIAL CELLS SEEN     FEW GRAM POSITIVE COCCI IN PAIRS     IN CLUSTERS     Performed at [facility]   Culture     Final   Value: MODERATE STAPHYLOCOCCUS AUREUS     Note: RIFAMPIN AND GENTAMICIN SHOULD NOT BE USED AS SINGLE DRUGS FOR TREATMENT OF STAPH INFECTIONS.     Performed at [facility]   Report Status PENDING   Incomplete  ANAEROBIC CULTURE     Status: None   Collection Time    10/02/13  1:20 PM      Result Value Ref Range Status   Specimen Description FOOT RIGHT   Final   Special Requests NONE   Final   Gram Stain     Final   Value: FEW WBC PRESENT,BOTH PMN AND MONONUCLEAR     NO SQUAMOUS EPITHELIAL CELLS SEEN     FEW GRAM POSITIVE COCCI IN PAIRS     IN CLUSTERS     Performed at [facility]   Culture     Final   Value: NO ANAEROBES ISOLATED; CULTURE IN PROGRESS FOR 5 DAYS     Performed at [facility]   Report Status PENDING   Incomplete     Studies: Dg Foot Complete Right  10/02/13   CLINICAL DATA:  Postop.  EXAM: RIGHT FOOT COMPLETE - 3+ VIEW  COMPARISON:  September 14, 2013.  FINDINGS: Status post surgical amputation of the second distal phalanx. No other fracture or dislocation is noted. 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 No radiopaque foreign body is noted.  IMPRESSION: Status post surgical amputation of the second distal phalanx.   Electronically Signed   By: Roque Lias M.D.   On: 2013/10/02 14:16    Scheduled Meds: . Chlorhexidine Gluconate Cloth  6 each Topical Q0600  . divalproex  250 mg Oral Daily  . mupirocin ointment  1 application Nasal BID  . naproxen  500 mg Oral BID  . sulfamethoxazole-trimethoprim  1 tablet Oral Q12H   Continuous Infusions:    [facility]  [facility] Pager [phone].   If 8PM-8AM, please  contact night-coverage at [email or web address], password [facility] 09/19/2013, 7:52 AM  LOS: 5  days      **Disclaimer: This note may have been dictated with voice recognition software. Similar sounding words can inadvertently be transcribed and this note may contain transcription errors which may not have been corrected upon publication of note.**

## 2013-09-20 LAB — BASIC METABOLIC PANEL
ANION GAP: 11 (ref 5–15)
BUN: 13 mg/dL (ref 6–23)
CO2: 23 meq/L (ref 19–32)
Calcium: 8.9 mg/dL (ref 8.4–10.5)
Chloride: 105 mEq/L (ref 96–112)
Creatinine, Ser: 0.75 mg/dL (ref 0.50–1.10)
GFR calc Af Amer: >90 mL/min (ref >90)
GFR calc non Af Amer: >90 mL/min (ref >90)
GLUCOSE: 88 mg/dL (ref 70–99)
POTASSIUM: 4.2 meq/L (ref 3.7–5.3)
SODIUM: 139 meq/L (ref 137–147)

## 2013-09-20 LAB — CBC
HCT: 36 % (ref 36.0–46.0)
HEMOGLOBIN: 12.7 g/dL (ref 12.0–15.0)
MCH: 37 pg — ABNORMAL HIGH (ref 26.0–34.0)
MCHC: 35.3 g/dL (ref 30.0–36.0)
MCV: 105 fL — ABNORMAL HIGH (ref 78.0–100.0)
Platelets: 292 10*3/uL (ref 150–400)
RBC: 3.43 MIL/uL — ABNORMAL LOW (ref 3.87–5.11)
RDW: 11.8 % (ref 11.5–15.5)
WBC: 3.8 10*3/uL — AB (ref 4.0–10.5)

## 2013-09-20 LAB — WOUND CULTURE

## 2013-09-20 MED ORDER — OXYCODONE-ACETAMINOPHEN 5-325 MG PO TABS: 1.0000 | ORAL_TABLET | ORAL | Status: DC | PRN

## 2013-09-20 MED ORDER — DSS 100 MG PO CAPS: 100.0000 mg | ORAL_CAPSULE | Freq: Two times a day (BID) | ORAL | Status: DC

## 2013-09-20 MED ORDER — NICOTINE POLACRILEX 2 MG MT GUM: 2.0000 mg | CHEWING_GUM | OROMUCOSAL | Status: DC | PRN

## 2013-09-20 MED ORDER — AMOXICILLIN 500 MG PO CAPS: 500.0000 mg | ORAL_CAPSULE | Freq: Three times a day (TID) | ORAL | Status: DC

## 2013-09-20 MED ORDER — POLYETHYLENE GLYCOL 3350 17 G PO PACK: 17.0000 g | PACK | Freq: Every day | ORAL | Status: DC | PRN

## 2013-09-20 MED ORDER — NAPROXEN 500 MG PO TABS: 500.0000 mg | ORAL_TABLET | Freq: Two times a day (BID) | ORAL | Status: DC

## 2013-09-20 MED ORDER — SULFAMETHOXAZOLE-TMP DS 800-160 MG PO TABS: 1.0000 | ORAL_TABLET | Freq: Two times a day (BID) | ORAL | Status: DC

## 2013-09-20 MED ORDER — AMOXICILLIN 250 MG PO CAPS
500.0000 mg | ORAL_CAPSULE | Freq: Three times a day (TID) | ORAL | Status: DC
  Administered 2013-09-20 (×2): 500 mg via ORAL
  Filled 2013-09-20 (×2): qty 2

## 2013-09-20 MED ORDER — DIVALPROEX SODIUM ER 250 MG PO TB24: 250.0000 mg | ORAL_TABLET | Freq: Every day | ORAL | Status: DC

## 2013-09-20 NOTE — Clinical Social Work Note (Signed)
Pt d/c today to alternative family living home arranged by guardian, Garlon HatchetMaiya [name]. [name] has contacted facility and they will pick pt up around 1:00 this afternoon. Pt notified and is concerned about her things at [name]. Left voicemail for Sylvia to call pt regarding this issue. [facility].   Derenda FennelKara Berenis Corter, LCSW [phone]

## 2013-09-20 NOTE — Discharge Instructions (Signed)
Leave dressing clean and dry till follow up with Dr. Nolen MuMcKinney.

## 2013-09-20 NOTE — Discharge Summary (Signed)
[facility]  Physician Discharge Summary   Patient ID: Sylvia Sylvia [name] MRN: [id number] DOB/AGE: Sep 02, 1985 28 y.o.  Admit date: 09/14/2013 Discharge date: 09/20/2013  PCP: Sylvia Sylvia [name], Sylvia T, PA-C  DISCHARGE DIAGNOSES:  Principal Problem:   Cellulitis and abscess of toe of right foot Active Problems:   Hepatitis C   CP (cerebral palsy)   Toe pain, right   Osteomyelitis of toe of right foot   Schizoaffective schizophrenia   Seizure disorder   RECOMMENDATIONS FOR OUTPATIENT FOLLOW UP: 1. Needs to keep f/u appointments with Dr. Nolen MuMcKinney   DISCHARGE CONDITION: fair  Diet recommendation: Regular  Filed Weights   09/14/13 1342 09/14/13 2128 09/17/13 1138  Weight: 70.308 kg (155 lb) 70.67 kg (155 lb 12.8 oz) 70.308 kg (155 lb)    INITIAL HISTORY: 27yo with history of seizures, CP and herpes who presented with pain in right 2nd toe for a week. Admitted with cellulitis. Found to have osteomyelitis and small abscess right 2nd toe.  Consultations:  Dr. Nolen MuMcKinney (Podiatry)  Procedures:  Partial amputation of Right 2nd Toe on 09/17/13  Sylvia [name] COURSE:   Osteomyelitis/Cellulitis and abscess of toe of right foot/Staph aureus  She underwent MRI of her toe. She was placed on Vancomycin. MRI revealed changes of osteomyelitis of the terminal phalanx on the right 2nd toe. She was seen by podiatry and underwent partial amputation of the toe by Dr. Nolen MuMcKinney. Her case was discussed with ID, Dr. Orvan Falconerampbell and it was felt that since the source of infection had been removed it might be reasonable to change her to oral antibiotics. Blood cultures were not obtained. Wound cultures grew Staph Aureus. It is not final yet. She will be discharged on Bactrim and Amoxicillin. Total duration to be determined primarily by how the operative site looks. She will be given antibiotics for at least 3 weeks for now. Appreciate Dr. Loralie ChampagneMckinney's assistance with this case.   Seizure disorder No episodes  here. Continue Depakote. Her level was low but she has been on that dose for a long time so no changes were made.  Social Situation  Lives in group home. She is a ward of the state. Her guardian is Sylvia [place]anda [place] [facility]. CSW following for this issue. Will be going to another facility.   CP (cerebral palsy)  Stable.   She has been cleared for discharge by podiatry. She is afebrile. Her pain is better. If she tolrates Amoxicillin she can be discharged later today. Patient agreeable with the plan.   PERTINENT LABS:  The results of significant diagnostics from this hospitalization (including imaging, microbiology, ancillary and laboratory) are listed below for reference.    Microbiology: Recent Results (from the past 240 hour(s))  SURGICAL PCR SCREEN     Status: Abnormal   Collection Time    09/17/13  7:07 AM      Result Value Ref Range Status   MRSA, PCR NEGATIVE  NEGATIVE Final   Staphylococcus aureus POSITIVE (*) NEGATIVE Final   Comment:            The Xpert SA Assay (FDA     approved for NASAL specimens     in patients over 421 years of age),     is one component of     a comprehensive surveillance     program.  Test performance has     been validated by [facility] for patients greater     than or equal to 1 year  old.     It is not intended     to diagnose infection nor to     guide or monitor treatment.     RESULT CALLED TO, READ BACK BY AND VERIFIED WITH:     HAWKINS,M AT 1042 BY HUFFINES,S ON 09/17/13  WOUND CULTURE     Status: None   Collection Time    09/17/13  1:20 PM      Result Value Ref Range Status   Specimen Description FOOT RIGHT   Final   Special Requests NONE   Final   Gram Stain     Final   Value: FEW WBC PRESENT,BOTH PMN AND MONONUCLEAR     NO SQUAMOUS EPITHELIAL CELLS SEEN     FEW GRAM POSITIVE COCCI IN PAIRS     IN CLUSTERS     Performed at [facility]   Culture     Final   Value: MODERATE STAPHYLOCOCCUS AUREUS      Note: RIFAMPIN AND GENTAMICIN SHOULD NOT BE USED AS SINGLE DRUGS FOR TREATMENT OF STAPH INFECTIONS.     Performed at [facility]   Report Status PENDING   Incomplete  ANAEROBIC CULTURE     Status: None   Collection Time    09/17/13  1:20 PM      Result Value Ref Range Status   Specimen Description FOOT RIGHT   Final   Special Requests NONE   Final   Gram Stain     Final   Value: FEW WBC PRESENT,BOTH PMN AND MONONUCLEAR     NO SQUAMOUS EPITHELIAL CELLS SEEN     FEW GRAM POSITIVE COCCI IN PAIRS     IN CLUSTERS     Performed at [facility]   Culture     Final   Value: NO ANAEROBES ISOLATED; CULTURE IN PROGRESS FOR 5 DAYS     Performed at [facility]   Report Status PENDING   Incomplete     Labs: Basic Metabolic Panel:  Recent Labs Lab 09/15/13 0520 09/16/13 0511 09/17/13 0531 09/18/13 0550 09/20/13 0516  NA 134* 142 140 140 139  K 3.9 4.4 4.3 4.2 4.2  CL 96 105 104 105 105  CO2 26 26 25 26 23   GLUCOSE 86 92 102* 89 88  BUN 6 9 9 10 13   CREATININE 0.59 0.60 0.62 0.62 0.75  CALCIUM 8.7 9.2 9.3 9.0 8.9   Liver Function Tests:  Recent Labs Lab 09/15/13 0513  AST 14  ALT 8  ALKPHOS 67  BILITOT 0.3  PROT 6.8  ALBUMIN 3.4*   CBC:  Recent Labs Lab 09/14/13 1820 09/15/13 0520 09/16/13 0511 09/18/13 0550 09/20/13 0516  WBC 9.2 7.9 5.0 5.5 3.8*  NEUTROABS 6.3  --   --   --   --   HGB 13.6 12.7 12.3 12.3 12.7  HCT 40.0 37.5 35.2* 35.9* 36.0  MCV 108.7* 108.1* 107.6* 107.5* 105.0*  PLT 261 238 243 296 292    IMAGING STUDIES Mr Toes Right Wo/w Cm  09/16/2013   CLINICAL DATA:  Swelling and red RIGHT second toe.  Osteomyelitis.  EXAM: MRI OF THE RIGHT TOES WITHOUT AND WITH CONTRAST  TECHNIQUE: Multiplanar, multisequence MR imaging was performed both before and after administration of intravenous contrast.  CONTRAST:  14mL MULTIHANCE GADOBENATE DIMEGLUMINE 529 MG/ML IV SOLN  COMPARISON:  Radiographs 09/14/2013.  FINDINGS: Hypoplastic  fourth metatarsal. Diffuse edema and enhancement is present in the tip of the second toe of surrounding  the terminal phalanx. There is ulceration along the tip of the toe, in the plantar and lateral surface. There is osteomyelitis of the terminal phalanx with effacement of normal fatty marrow and edema/enhancement. Osteolysis of the cortex of the terminal phalanx is present. Abscess extending deep to the ulceration measures 13 mm transverse, 15 mm plantar to dorsal and 1 cm in the long axis of the foot.  The other toes appear within normal limits aside from shortening of the fourth toe. There is no osteomyelitis of the middle or proximal phalanx of the second toe. No clear evidence of septic arthritis.  IMPRESSION: Osteomyelitis of the terminal phalanx of the second toe with approximately 1 cm soft tissue abscess deep to ulcer on the second toe.   Electronically Signed   By: Andreas Newport M.D.   On: 09/16/2013 08:13   Dg Foot Complete Right  09/17/2013   CLINICAL DATA:  Postop.  EXAM: RIGHT FOOT COMPLETE - 3+ VIEW  COMPARISON:  September 14, 2013.  FINDINGS: Status post surgical amputation of the second distal phalanx. No other fracture or dislocation is noted. 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 No radiopaque foreign body is noted.  IMPRESSION: Status post surgical amputation of the second distal phalanx.   Electronically Signed   By: Roque Lias M.D.   On: 09/17/2013 14:16   Dg Foot Complete Right  09/14/2013   CLINICAL DATA:  Right foot pain.  EXAM: RIGHT FOOT COMPLETE - 3+ VIEW  COMPARISON:  None.  FINDINGS: There is no evidence of fracture or dislocation. There is no evidence of arthropathy. Congenital shortening of fourth metatarsal is noted. Soft tissues are unremarkable.  IMPRESSION: No acute abnormality seen in the right foot.   Electronically Signed   By: Roque Lias M.D.   On: 09/14/2013 14:37    DISCHARGE EXAMINATION: Filed Vitals:   09/19/13 0530 09/19/13 1430 09/19/13 2130  09/20/13 0627  BP: 119/73 120/71 114/83 109/59  Pulse: 54 60 56 70  Temp: 98.6 F (37 C) 98.1 F (36.7 C) 98.2 F (36.8 C) 97.8 F (36.6 C)  TempSrc: Oral  Oral Oral  Resp: 20 20 20 20   Height:      Weight:      SpO2: 99% 99% 100% 100%   General appearance: alert, cooperative and no distress Resp: clear to auscultation bilaterally Cardio: regular rate and rhythm, S1, S2 normal, no murmur, click, rub or gallop GI: soft, non-tender; bowel sounds normal; no masses,  no organomegaly Neurologic: No focal deficits  DISPOSITION: Sylvia Sylvia [name] (Group Home)  Discharge Instructions   Diet general    Complete by:  As directed      Discharge instructions    Complete by:  As directed   Please follow up with Dr. Nolen Mu as scheduled. Please see your PCP as well as recommended.     Discharge wound care:    Complete by:  As directed   Keep dressing clean and dry till follow up with surgeon.     Increase activity slowly    Complete by:  As directed   Using a walker as recommended     Walker     Complete by:  As directed            ALLERGIES: No Known Allergies  Current Discharge Medication List    START taking these medications   Details  amoxicillin (AMOXIL) 500 MG capsule Take 1 capsule (500 mg total) by mouth every 8 (eight) hours. For  3 weeks and then to be determined by her treating physicians/podiatrist Qty: 63 capsule, Refills: 0    docusate sodium 100 MG CAPS Take 100 mg by mouth 2 (two) times daily. Qty: 60 capsule, Refills: 0    nicotine polacrilex (NICORETTE) 2 MG gum Take 1 each (2 mg total) by mouth as needed for smoking cessation. Qty: 100 tablet, Refills: 0    oxyCODONE-acetaminophen (PERCOCET/ROXICET) 5-325 MG per tablet Take 1-2 tablets by mouth every 4 (four) hours as needed for severe pain. Qty: 30 tablet, Refills: 0    polyethylene glycol (MIRALAX / GLYCOLAX) packet Take 17 g by mouth daily as needed for moderate constipation. Qty: 14 each,  Refills: 0    sulfamethoxazole-trimethoprim (BACTRIM DS) 800-160 MG per tablet Take 1 tablet by mouth every 12 (twelve) hours. For 3 weeks and then to be determined by her treating physician/podiatrist Qty: 42 tablet, Refills: 0      CONTINUE these medications which have CHANGED   Details  divalproex (DEPAKOTE ER) 250 MG 24 hr tablet Take 1 tablet (250 mg total) by mouth daily. Qty: 30 tablet, Refills: 1    naproxen (NAPROSYN) 500 MG tablet Take 1 tablet (500 mg total) by mouth 2 (two) times daily. Qty: 60 tablet, Refills: 0       Follow-up Information   Follow up with Sylvia Sylvia [name], DPM On 10/01/2013. (your appointment is on 10/01/13 at 4PM. For surgical follow up.)    Specialty:  Podiatry   Contact information:   [address] [place] [address] [phone]       Follow up with Sylvia Sylvia [name], Sylvia T, PA-C. Schedule an appointment as soon as possible for a visit in 1 week. (post hospitalization follow up)    Specialty:  Physician Assistant   Contact information:   [address] [place] [address] [phone]       TOTAL DISCHARGE TIME: 35 mins  [facility] Sylvia [name]  [facility] Pager [phone]  09/20/2013, 8:13 AM  Disclaimer: This note was dictated with voice recognition software. Similar sounding words can inadvertently be transcribed and may not be corrected upon review.

## 2013-09-20 NOTE — Progress Notes (Signed)
UR review completed. 

## 2013-09-22 LAB — ANAEROBIC CULTURE

## 2013-12-20 ENCOUNTER — Encounter (HOSPITAL_COMMUNITY): Payer: Self-pay | Admitting: Internal Medicine

## 2014-08-01 ENCOUNTER — Emergency Department (HOSPITAL_COMMUNITY)
Admission: EM | Admit: 2014-08-01 | Discharge: 2014-08-01 | Disposition: A | Discharge status: Home / Self Care | Payer: Medicare Other | Attending: Emergency Medicine | Admitting: Emergency Medicine

## 2014-08-01 ENCOUNTER — Encounter (HOSPITAL_COMMUNITY): Payer: Self-pay | Admitting: Emergency Medicine

## 2014-08-01 DIAGNOSIS — R569 Unspecified convulsions: Secondary | ICD-10-CM | POA: Diagnosis present

## 2014-08-01 DIAGNOSIS — Z8673 Personal history of transient ischemic attack (TIA), and cerebral infarction without residual deficits: Secondary | ICD-10-CM | POA: Diagnosis not present

## 2014-08-01 DIAGNOSIS — Z8669 Personal history of other diseases of the nervous system and sense organs: Secondary | ICD-10-CM | POA: Insufficient documentation

## 2014-08-01 DIAGNOSIS — Z8659 Personal history of other mental and behavioral disorders: Secondary | ICD-10-CM | POA: Diagnosis not present

## 2014-08-01 DIAGNOSIS — Z8619 Personal history of other infectious and parasitic diseases: Secondary | ICD-10-CM | POA: Insufficient documentation

## 2014-08-01 DIAGNOSIS — Z720 Tobacco use: Secondary | ICD-10-CM | POA: Insufficient documentation

## 2014-08-01 DIAGNOSIS — Z79899 Other long term (current) drug therapy: Secondary | ICD-10-CM | POA: Diagnosis not present

## 2014-08-01 DIAGNOSIS — G40909 Epilepsy, unspecified, not intractable, without status epilepticus: Secondary | ICD-10-CM | POA: Insufficient documentation

## 2014-08-01 LAB — URINALYSIS, ROUTINE W REFLEX MICROSCOPIC
Bilirubin Urine: NEGATIVE
Glucose, UA: NEGATIVE mg/dL
Hgb urine dipstick: NEGATIVE
Ketones, ur: NEGATIVE mg/dL
Nitrite: NEGATIVE
Protein, ur: NEGATIVE mg/dL
Specific Gravity, Urine: 1.015 (ref 1.005–1.030)
Urobilinogen, UA: 0.2 mg/dL (ref 0.0–1.0)
pH: 6.5 (ref 5.0–8.0)

## 2014-08-01 LAB — I-STAT CHEM 8, ED
BUN: 4 mg/dL — ABNORMAL LOW (ref 6–20)
Calcium, Ion: 1.11 mmol/L — ABNORMAL LOW (ref 1.12–1.23)
Chloride: 101 mmol/L (ref 101–111)
Creatinine, Ser: 0.6 mg/dL (ref 0.44–1.00)
Glucose, Bld: 87 mg/dL (ref 65–99)
HCT: 43 % (ref 36.0–46.0)
Hemoglobin: 14.6 g/dL (ref 12.0–15.0)
Potassium: 3.6 mmol/L (ref 3.5–5.1)
Sodium: 137 mmol/L (ref 135–145)
TCO2: 19 mmol/L (ref 0–100)

## 2014-08-01 LAB — CBG MONITORING, ED: Glucose-Capillary: 144 mg/dL — ABNORMAL HIGH (ref 65–99)

## 2014-08-01 LAB — URINE MICROSCOPIC-ADD ON

## 2014-08-01 LAB — VALPROIC ACID LEVEL: Valproic Acid Lvl: <10 ug/mL — ABNORMAL LOW (ref 50.0–100.0)

## 2014-08-01 LAB — RAPID URINE DRUG SCREEN, HOSP PERFORMED
Amphetamines: NOT DETECTED
Barbiturates: NOT DETECTED
Benzodiazepines: NOT DETECTED
Cocaine: NOT DETECTED
Opiates: NOT DETECTED
Tetrahydrocannabinol: NOT DETECTED

## 2014-08-01 LAB — ETHANOL: Alcohol, Ethyl (B): 16 mg/dL — ABNORMAL HIGH (ref <5)

## 2014-08-01 MED ORDER — LORAZEPAM 2 MG/ML IJ SOLN: 1.0000 mg | Freq: Once | INTRAMUSCULAR | Status: DC

## 2014-08-01 MED ORDER — DIVALPROEX SODIUM ER 250 MG PO TB24: 250.0000 mg | ORAL_TABLET | Freq: Every day | ORAL | Status: DC

## 2014-08-01 NOTE — ED Notes (Signed)
Dr. Linker at the bedside.  

## 2014-08-01 NOTE — ED Provider Notes (Signed)
CSN: [id number]     Arrival date & time 08/01/14  4098 History   First MD Initiated Contact with Patient 08/01/14 0802     Chief Complaint  Patient presents with  . Seizures     (Consider location/radiation/quality/duration/timing/severity/associated sxs/prior Treatment) HPI  A LEVEL 5 CAVEAT PERTAINS DUE TO POSTICTAL STATE.  Pt requesting ativan.  States her seizures are due to her doing drugs last night and drinking alcohol.  She states she has a court date today.  She states her last drink was this morning.  She states she hasn't taken depakote in several months "because I ran away from the state".    Past Medical History  Diagnosis Date  . CP (cerebral palsy)   . Hepatitis C   . Herpes   . Stroke   . Seizures   . Schizoaffective schizophrenia 09/17/2013    Per [facility] from [facility] as on 2014    Past Surgical History  Procedure Laterality Date  . Leg surgery Right     x7  . Amputation Right 09/17/2013    Procedure: PARTIAL AMPUTATION 2ND TOE RIGHT FOOT;  Surgeon: Dallas Schimke, DPM;  Location: [facility];  Service: Podiatry;  Laterality: Right;   Family History  Problem Relation Age of Onset  . Bipolar disorder Mother    History  Substance Use Topics  . Smoking status: Current Every Day Smoker -- 1.00 packs/day for 14 years    Types: Cigarettes  . Smokeless tobacco: Former Neurosurgeon  . Alcohol Use: Yes     Comment: Per patient 0.5 gallon to gallon when she can but since being in group home she can't do it as often    OB History    Gravida Para Term Preterm AB TAB SAB Ectopic Multiple Living   Review of Systems  ROS reviewed and all otherwise negative except for mentioned in HPI    Allergies  Review of patient's allergies indicates no known allergies.  Home Medications   Prior to Admission medications   Medication Sig Start Date End Date Taking? Authorizing Provider  naproxen (NAPROSYN) 500 MG tablet Take 1 tablet (500 mg total) by mouth 2  (two) times daily. 09/20/13  Yes Osvaldo Shipper, MD  divalproex (DEPAKOTE ER) 250 MG 24 hr tablet Take 1 tablet (250 mg total) by mouth daily. 08/01/14   Jerelyn Scott, MD  docusate sodium 100 MG CAPS Take 100 mg by mouth 2 (two) times daily. Patient not taking: Reported on 08/01/2014 09/20/13   Osvaldo Shipper, MD   BP 130/79 mmHg  Pulse 79  Temp(Src) 98.1 F (36.7 C) (Oral)  Resp 15  Ht  (1.626 m)  Wt 160 lb (72.576 kg)  BMI 27.45 kg/m2  SpO2 100%  Vitals reviewed Physical Exam  Physical Examination: General appearance - alert, tired appearing, and in no distress Mental status - alert, oriented to person, place, and time Eyes - pupils equal and reactive, extraocular eye movements intact Mouth - mucous membranes moist, pharynx normal without lesions Chest - clear to auscultation, no wheezes, rales or rhonchi, symmetric air entry Heart - normal rate, regular rhythm, normal S1, S2, no murmurs, rubs, clicks or gallops Neurological - alert, oriented x 3, moving all extremities, no seizure activity, sensation intact Extremities - peripheral pulses normal, no pedal edema, no clubbing or cyanosis Skin - normal coloration and turgor, no rashes  ED Course  Procedures (including critical care time)  Labs Review Labs Reviewed  URINALYSIS, ROUTINE W REFLEX MICROSCOPIC (NOT AT [facility]) - Abnormal; Notable for the following:    APPearance HAZY (*)    Leukocytes, UA TRACE (*)    All other components within normal limits  ETHANOL - Abnormal; Notable for the following:    Alcohol, Ethyl (B) 16 (*)    All other components within normal limits  VALPROIC ACID LEVEL - Abnormal; Notable for the following:    Valproic Acid Lvl <10 (*)    All other components within normal limits  URINE MICROSCOPIC-ADD ON - Abnormal; Notable for the following:    Squamous Epithelial / LPF FEW (*)    Bacteria, UA FEW (*)    All other components within normal limits  CBG MONITORING, ED - Abnormal; Notable for the  following:    Glucose-Capillary 144 (*)    All other components within normal limits  I-STAT CHEM 8, ED - Abnormal; Notable for the following:    BUN 4 (*)    Calcium, Ion 1.11 (*)    All other components within normal limits  URINE RAPID DRUG SCREEN, HOSP PERFORMED    Imaging Review No results found.   EKG Interpretation None      MDM   Final diagnoses:  Seizure    Pt presenting after seizure this morning, witnessed by friend accompanying her to the ED  She has not been taking depakote for seizures for several months, she states she has been drinking alcohol last night.  After ED workup, pt is awake, ambulating on her own.   Discharged with strict return precautions.  Pt agreeable with plan.   I have advised her to restart her depakote medication and given her a prescription for this.      Jerelyn Scott, MD 08/01/14 1414

## 2014-08-01 NOTE — ED Notes (Addendum)
Pt in EMS after 2 seizures this am. Grand mal lasting 45 sec according to EMS. Pt has hx seizures, CVA with R sided deficits, cerebral palsy. Last time had a seizure was last week. Pt bit tongue during seizure

## 2014-08-01 NOTE — ED Notes (Signed)
Pt able to ambulate independently 

## 2014-08-01 NOTE — ED Notes (Signed)
Phlebotomy at the bedside  

## 2014-08-01 NOTE — Discharge Instructions (Signed)
Return to the ED with any concerns including recurrent seizure activity, difficulty breathing, fainting, decreased level of alertness/lethargy, or any other alarming symptoms  It is important that you restart your depakote as this will help prevent you from having further seizures

## 2014-08-01 NOTE — ED Notes (Signed)
Reported to Dr. Karma Ganja that family is awaiting update. MD acknowledges.

## 2014-12-30 ENCOUNTER — Emergency Department (HOSPITAL_COMMUNITY)
Admission: EM | Admit: 2014-12-30 | Discharge: 2014-12-30 | Disposition: A | Discharge status: Home / Self Care | Payer: Medicare Other | Attending: Emergency Medicine | Admitting: Emergency Medicine

## 2014-12-30 ENCOUNTER — Encounter (HOSPITAL_COMMUNITY): Payer: Self-pay | Admitting: Emergency Medicine

## 2014-12-30 DIAGNOSIS — Z8673 Personal history of transient ischemic attack (TIA), and cerebral infarction without residual deficits: Secondary | ICD-10-CM | POA: Diagnosis not present

## 2014-12-30 DIAGNOSIS — Z3202 Encounter for pregnancy test, result negative: Secondary | ICD-10-CM | POA: Diagnosis not present

## 2014-12-30 DIAGNOSIS — Z720 Tobacco use: Secondary | ICD-10-CM | POA: Insufficient documentation

## 2014-12-30 DIAGNOSIS — G40909 Epilepsy, unspecified, not intractable, without status epilepticus: Secondary | ICD-10-CM | POA: Diagnosis not present

## 2014-12-30 DIAGNOSIS — F1210 Cannabis abuse, uncomplicated: Secondary | ICD-10-CM | POA: Insufficient documentation

## 2014-12-30 DIAGNOSIS — Z8659 Personal history of other mental and behavioral disorders: Secondary | ICD-10-CM | POA: Diagnosis not present

## 2014-12-30 DIAGNOSIS — R569 Unspecified convulsions: Secondary | ICD-10-CM

## 2014-12-30 DIAGNOSIS — R51 Headache: Secondary | ICD-10-CM | POA: Diagnosis not present

## 2014-12-30 DIAGNOSIS — Z8619 Personal history of other infectious and parasitic diseases: Secondary | ICD-10-CM | POA: Insufficient documentation

## 2014-12-30 DIAGNOSIS — R519 Headache, unspecified: Secondary | ICD-10-CM

## 2014-12-30 LAB — RAPID URINE DRUG SCREEN, HOSP PERFORMED
AMPHETAMINES: NOT DETECTED
BENZODIAZEPINES: NOT DETECTED
Barbiturates: NOT DETECTED
Cocaine: NOT DETECTED
OPIATES: NOT DETECTED
TETRAHYDROCANNABINOL: POSITIVE — AB

## 2014-12-30 LAB — COMPREHENSIVE METABOLIC PANEL
ALT: 81 U/L — ABNORMAL HIGH (ref 14–54)
AST: 118 U/L — ABNORMAL HIGH (ref 15–41)
Albumin: 4.2 g/dL (ref 3.5–5.0)
Alkaline Phosphatase: 87 U/L (ref 38–126)
Anion gap: 9 (ref 5–15)
BILIRUBIN TOTAL: 0.7 mg/dL (ref 0.3–1.2)
BUN: <5 mg/dL — ABNORMAL LOW (ref 6–20)
CHLORIDE: 105 mmol/L (ref 101–111)
CO2: 22 mmol/L (ref 22–32)
Calcium: 9.3 mg/dL (ref 8.9–10.3)
Creatinine, Ser: 0.57 mg/dL (ref 0.44–1.00)
GFR calc non Af Amer: >60 mL/min (ref >60)
Glucose, Bld: 90 mg/dL (ref 65–99)
Potassium: 4.2 mmol/L (ref 3.5–5.1)
Sodium: 136 mmol/L (ref 135–145)
TOTAL PROTEIN: 7.8 g/dL (ref 6.5–8.1)

## 2014-12-30 LAB — URINALYSIS, ROUTINE W REFLEX MICROSCOPIC
Bilirubin Urine: NEGATIVE
Glucose, UA: NEGATIVE mg/dL
HGB URINE DIPSTICK: NEGATIVE
Ketones, ur: NEGATIVE mg/dL
LEUKOCYTES UA: NEGATIVE
Nitrite: NEGATIVE
PROTEIN: NEGATIVE mg/dL
SPECIFIC GRAVITY, URINE: 1.006 (ref 1.005–1.030)
Urobilinogen, UA: 0.2 mg/dL (ref 0.0–1.0)
pH: 6.5 (ref 5.0–8.0)

## 2014-12-30 LAB — CBC WITH DIFFERENTIAL/PLATELET
Basophils Absolute: 0 10*3/uL (ref 0.0–0.1)
Basophils Relative: 0 %
EOS PCT: 1 %
Eosinophils Absolute: 0.1 10*3/uL (ref 0.0–0.7)
HEMATOCRIT: 41.9 % (ref 36.0–46.0)
Hemoglobin: 14 g/dL (ref 12.0–15.0)
LYMPHS PCT: 24 %
Lymphs Abs: 1.6 10*3/uL (ref 0.7–4.0)
MCH: 33.4 pg (ref 26.0–34.0)
MCHC: 33.4 g/dL (ref 30.0–36.0)
MCV: 100 fL (ref 78.0–100.0)
MONO ABS: 0.6 10*3/uL (ref 0.1–1.0)
Monocytes Relative: 10 %
NEUTROS ABS: 4.4 10*3/uL (ref 1.7–7.7)
Neutrophils Relative %: 65 %
PLATELETS: 213 10*3/uL (ref 150–400)
RBC: 4.19 MIL/uL (ref 3.87–5.11)
RDW: 14.5 % (ref 11.5–15.5)
WBC: 6.7 10*3/uL (ref 4.0–10.5)

## 2014-12-30 LAB — ETHANOL: Alcohol, Ethyl (B): 42 mg/dL — ABNORMAL HIGH (ref <5)

## 2014-12-30 LAB — POC URINE PREG, ED: Preg Test, Ur: NEGATIVE

## 2014-12-30 MED ORDER — LORAZEPAM 0.5 MG PO TABS
0.5000 mg | ORAL_TABLET | Freq: Once | ORAL | Status: AC
  Administered 2014-12-30: 0.5 mg via ORAL
  Filled 2014-12-30: qty 1

## 2014-12-30 NOTE — ED Notes (Addendum)
Per EMS- hx seizures, has not been taking Depakote or Dilantin for "a long time." Friend witnessed seizure lasting approximately 5 minutes. Friend helped lower her to the ground. Did not hit her head. Did drink ETOH today, unknown amount. Pt says "a little bit." Hx cerebral palsy. VS: 122/88 HR 96 SpO2 100% CBG 115 mg/dl. RR 16. A&Ox4 -slow to answer questions.   Upon walking in the room pt says, "If y'all aren't going to give me Ativan then I'm going to leave because I don't want to have another seizure." Pt is ambulatory, A&Ox4. Has not been taking seizure medication d/t kidney and liver failure. Is on transplant list and says Keppra does not work for her for seizure prevention. No other c/c.

## 2014-12-30 NOTE — ED Provider Notes (Signed)
Patient had seizure earlier today. She admits to drinking alcohol, 4 drinks between 7:15 this morning and time of arrival. She admits to noncompliance with Neurontin for the past 3 or 4 days. She takes only Neurontin for seizures. No other associated symptoms. Reports history of liver failure and kidney failure and seizure disorder and cerebral palsy. Patient is alert, Glasgow coma score 15 no distress. Does not appear to be intoxicated, mildly argumentative HEENT exam no facial asymmetry neck supple lungs clear auscultation heart regular rate and rhythm neurologic Glasgow Coma Score 15 cranial nerves II through XII intact moves all extremities well  Doug SouSam Jaman Aro, MD 12/30/14 1538

## 2014-12-30 NOTE — ED Notes (Signed)
Pt tolerated fluid challenge with no adverse vomiting or nausea.

## 2014-12-30 NOTE — ED Notes (Signed)
Pt not in the room upon re-assessment. Aware she was not up for discharge yet. Ambulatory.

## 2014-12-30 NOTE — ED Notes (Signed)
Bed: WA01 Expected date:  Expected time:  Means of arrival:  Comments: Ems- seizure

## 2014-12-30 NOTE — ED Provider Notes (Signed)
CSN: [id number]     Arrival date & time 12/30/14  1411 History   None    Chief Complaint  Patient presents with  . Seizures     (Consider location/radiation/quality/duration/timing/severity/associated sxs/prior Treatment) HPI   Sylvia [name] is a 29 y.o. female, with a history of seizures, liver failure, kidney failure, Hep C, and multiple suicide attempts, presenting to the ED with complaint of a seizure that occurred about 2 hours ago. Pt does not know how long it lasted. The friend that brought her to the ED are no longer in the room. Triage note states that pt did not hit her head and was lowered to the ground by her friend. Pt admits to alcohol intake, but states, "It wasn't that much." Last seizure was 2-3 months ago. Pt states she takes Neurontin for her seizures, but has not taken it for 3-4 days. Pt states she doesn't know why she stopped taking it. Pt complains of bilateral headache, throbbing in nature, 7/10, nonradiating and states, "It feels like my normal seizure headache." Pt refuses to answer further questions and states, "I just want some ativan so I can get out of here." Patient states she does not have a neurologist and refuses to say who prescribes her medications.  Past Medical History  Diagnosis Date  . CP (cerebral palsy) (HCC)   . Hepatitis C   . Herpes   . Stroke (HCC)   . Seizures (HCC)   . Schizoaffective schizophrenia (HCC) 09/17/2013    Per notes from [facility] as on 2014    Past Surgical History  Procedure Laterality Date  . Leg surgery Right     x7  . Amputation Right 09/17/2013    Procedure: PARTIAL AMPUTATION 2ND TOE RIGHT FOOT;  Surgeon: Dallas Schimke, DPM;  Location: [facility];  Service: Podiatry;  Laterality: Right;   Family History  Problem Relation Age of Onset  . Bipolar disorder Mother    Social History  Substance Use Topics  . Smoking status: Current Every Day Smoker -- 1.00 packs/day for 14 years    Types: Cigarettes  . Smokeless  tobacco: Former Neurosurgeon  . Alcohol Use: Yes     Comment: Per patient 0.5 gallon to gallon when she can but since being in group home she can't do it as often    OB History    Gravida Para Term Preterm AB TAB SAB Ectopic Multiple Living   Review of Systems  Neurological: Positive for seizures and headaches.  All other systems reviewed and are negative.     Allergies  Review of patient's allergies indicates no known allergies.  Home Medications   Prior to Admission medications   Medication Sig Start Date End Date Taking? Authorizing Provider  divalproex (DEPAKOTE ER) 250 MG 24 hr tablet Take 1 tablet (250 mg total) by mouth daily. Patient not taking: Reported on 12/30/2014 08/01/14   Jerelyn Scott, MD  docusate sodium 100 MG CAPS Take 100 mg by mouth 2 (two) times daily. Patient not taking: Reported on 08/01/2014 09/20/13   Osvaldo Shipper, MD   BP 129/77 mmHg  Pulse 80  Temp(Src) 98.4 F (36.9 C) (Oral)  Resp 15  SpO2 100% Physical Exam  Constitutional: She is oriented to person, place, and time. She appears well-developed and well-nourished. No distress.  HENT:  Head: Normocephalic and atraumatic.  Right Ear: External ear normal.  Left Ear: External ear normal.  Nose: Nose normal.  Mouth/Throat: Oropharynx is clear and moist.  Eyes: Conjunctivae and EOM are normal. Pupils are equal, round, and reactive to light.  Neck: Normal range of motion. Neck supple.  Cardiovascular: Normal rate, regular rhythm, normal heart sounds and intact distal pulses.   Pulmonary/Chest: Effort normal and breath sounds normal. No respiratory distress.  Abdominal: Soft. Bowel sounds are normal.  Musculoskeletal: Normal range of motion. She exhibits no edema or tenderness.  No paraspinal tenderness.   Neurological: She is alert and oriented to person, place, and time. She has normal reflexes.  No sensory deficits. Strength 5/5 in all extremities. No gait disturbance. Cranial  nerves II-XII grossly intact.  Skin: Skin is warm and dry. She is not diaphoretic.  Nursing note and vitals reviewed.   ED Course  Procedures (including critical care time) Labs Review Labs Reviewed  COMPREHENSIVE METABOLIC PANEL - Abnormal; Notable for the following:    BUN <5 (*)    AST 118 (*)    ALT 81 (*)    All other components within normal limits  ETHANOL - Abnormal; Notable for the following:    Alcohol, Ethyl (B) 42 (*)    All other components within normal limits  URINALYSIS, ROUTINE W REFLEX MICROSCOPIC (NOT AT [facility]) - Abnormal; Notable for the following:    APPearance CLOUDY (*)    All other components within normal limits  URINE RAPID DRUG SCREEN, HOSP PERFORMED - Abnormal; Notable for the following:    Tetrahydrocannabinol POSITIVE (*)    All other components within normal limits  CBC WITH DIFFERENTIAL/PLATELET  POC URINE PREG, ED    Imaging Review No results found. I have personally reviewed and evaluated these images and lab results as part of my medical decision-making.   EKG Interpretation   Date/Time:  Friday December 30 2014 14:27:27 EST Ventricular Rate:  95 PR Interval:  130 QRS Duration: 79 QT Interval:  372 QTC Calculation: 468 R Axis:   54 Text Interpretation:  Sinus rhythm Low voltage, precordial leads No  significant change since last tracing Confirmed by Ethelda ChickJACUBOWITZ  MD, SAM  [phone] on 12/30/2014 3:33:21 PM      MDM   Final diagnoses:  Seizures (HCC)  Acute nonintractable headache, unspecified headache type    Sylvia [name] presents with complaint of a seizure, of which pt has a history and is not taking her medications.   Findings and plan of care discussed with Doug SouSam Jacubowitz, MD.  Suspect the patient had a seizure due to lack of compliance with her Neurontin. Patient does not seem to have much interest in her own care and continuously asks for Ativan. Her exam was benign. Plan to observe the patient, review labs, and discharge  patient with instructions to take her home medications, especially her anti-seizure medicine.  Patient states that she does not want to be here in the ED any longer and wants to go home. 5:39 PM Went to go reassess pt and found the room empty. Pt left without waiting for instructions. Pt was not released and gave no warning that she was leaving, thus no AMA paperwork could be completed.    Anselm PancoastShawn C Eithel Ryall, PA-C 12/30/14 1759  Doug SouSam Jacubowitz, MD 12/31/14 [phone]

## 2014-12-30 NOTE — Discharge Instructions (Signed)
You have been seen today for a seizure. Your lab tests showed no abnormalities. Follow up with PCP as needed. Return to ED should symptoms worsen. It is advised that you take your medications for seizures regularly and avoid alcohol.

## 2015-02-01 ENCOUNTER — Encounter: Payer: Medicare Other | Admitting: Obstetrics & Gynecology

## 2015-03-02 ENCOUNTER — Encounter: Payer: Medicare Other | Admitting: Obstetrics and Gynecology

## 2015-03-15 ENCOUNTER — Encounter (HOSPITAL_COMMUNITY): Payer: Self-pay | Admitting: Emergency Medicine

## 2015-03-15 ENCOUNTER — Emergency Department (HOSPITAL_COMMUNITY)
Admission: EM | Admit: 2015-03-15 | Discharge: 2015-03-17 | Disposition: A | Discharge status: Other Acute Inpatient Hospital | Payer: Medicare Other | Attending: Emergency Medicine | Admitting: Emergency Medicine

## 2015-03-15 DIAGNOSIS — Z8669 Personal history of other diseases of the nervous system and sense organs: Secondary | ICD-10-CM | POA: Diagnosis not present

## 2015-03-15 DIAGNOSIS — Z3202 Encounter for pregnancy test, result negative: Secondary | ICD-10-CM | POA: Diagnosis not present

## 2015-03-15 DIAGNOSIS — F250 Schizoaffective disorder, bipolar type: Secondary | ICD-10-CM | POA: Diagnosis not present

## 2015-03-15 DIAGNOSIS — Z008 Encounter for other general examination: Secondary | ICD-10-CM | POA: Diagnosis present

## 2015-03-15 DIAGNOSIS — Z8673 Personal history of transient ischemic attack (TIA), and cerebral infarction without residual deficits: Secondary | ICD-10-CM | POA: Diagnosis not present

## 2015-03-15 DIAGNOSIS — Z8619 Personal history of other infectious and parasitic diseases: Secondary | ICD-10-CM | POA: Diagnosis not present

## 2015-03-15 DIAGNOSIS — F22 Delusional disorders: Secondary | ICD-10-CM | POA: Diagnosis not present

## 2015-03-15 DIAGNOSIS — F29 Unspecified psychosis not due to a substance or known physiological condition: Secondary | ICD-10-CM

## 2015-03-15 DIAGNOSIS — F259 Schizoaffective disorder, unspecified: Secondary | ICD-10-CM | POA: Diagnosis present

## 2015-03-15 DIAGNOSIS — F17210 Nicotine dependence, cigarettes, uncomplicated: Secondary | ICD-10-CM | POA: Diagnosis not present

## 2015-03-15 LAB — COMPREHENSIVE METABOLIC PANEL
ALT: 28 U/L (ref 14–54)
ANION GAP: 12 (ref 5–15)
AST: 40 U/L (ref 15–41)
Albumin: 4.6 g/dL (ref 3.5–5.0)
Alkaline Phosphatase: 62 U/L (ref 38–126)
BILIRUBIN TOTAL: 0.4 mg/dL (ref 0.3–1.2)
BUN: <5 mg/dL — ABNORMAL LOW (ref 6–20)
CO2: 23 mmol/L (ref 22–32)
Calcium: 9.7 mg/dL (ref 8.9–10.3)
Chloride: 110 mmol/L (ref 101–111)
Creatinine, Ser: 0.74 mg/dL (ref 0.44–1.00)
GFR calc Af Amer: >60 mL/min (ref >60)
Glucose, Bld: 76 mg/dL (ref 65–99)
POTASSIUM: 3.9 mmol/L (ref 3.5–5.1)
Sodium: 145 mmol/L (ref 135–145)
TOTAL PROTEIN: 7.8 g/dL (ref 6.5–8.1)

## 2015-03-15 LAB — CBC WITH DIFFERENTIAL/PLATELET
BASOS PCT: 0 %
Basophils Absolute: 0 10*3/uL (ref 0.0–0.1)
Eosinophils Absolute: 0.1 10*3/uL (ref 0.0–0.7)
Eosinophils Relative: 2 %
HEMATOCRIT: 40.9 % (ref 36.0–46.0)
Hemoglobin: 13.4 g/dL (ref 12.0–15.0)
LYMPHS PCT: 46 %
Lymphs Abs: 2.4 10*3/uL (ref 0.7–4.0)
MCH: 35.3 pg — ABNORMAL HIGH (ref 26.0–34.0)
MCHC: 32.8 g/dL (ref 30.0–36.0)
MCV: 107.6 fL — AB (ref 78.0–100.0)
MONO ABS: 0.4 10*3/uL (ref 0.1–1.0)
MONOS PCT: 7 %
NEUTROS ABS: 2.3 10*3/uL (ref 1.7–7.7)
Neutrophils Relative %: 45 %
Platelets: 230 10*3/uL (ref 150–400)
RBC: 3.8 MIL/uL — ABNORMAL LOW (ref 3.87–5.11)
RDW: 13.5 % (ref 11.5–15.5)
WBC: 5.2 10*3/uL (ref 4.0–10.5)

## 2015-03-15 LAB — ETHANOL: ALCOHOL ETHYL (B): 76 mg/dL — AB (ref <5)

## 2015-03-15 MED ORDER — HALOPERIDOL LACTATE 5 MG/ML IJ SOLN
10.0000 mg | Freq: Once | INTRAMUSCULAR | Status: AC
  Administered 2015-03-15: 10 mg via INTRAMUSCULAR
  Filled 2015-03-15: qty 2

## 2015-03-15 NOTE — ED Provider Notes (Addendum)
CSN: [id number]     Arrival date & time 03/15/15  1642 History   First MD Initiated Contact with Patient 03/15/15 1653     Chief Complaint  Patient presents with  . Psychiatric Evaluation     (Consider location/radiation/quality/duration/timing/severity/associated sxs/prior Treatment) HPI.... Level V caveat for psychosis. Patient was brought to the ED via EMS.  Patient presented to the ambulance base and started talking gibberish.  She had flight of ideas, paranoid and delusional thinking. Past medical history includes schizoaffective schizophrenia seizures, stroke, hepatitis CV, cerebral palsy.  Her power of attorney is in [place].   Past Medical History  Diagnosis Date  . CP (cerebral palsy) (HCC)   . Hepatitis C   . Herpes   . Stroke (HCC)   . Seizures (HCC)   . Schizoaffective schizophrenia (HCC) 09/17/2013    Per notes from [facility] as on 2014    Past Surgical History  Procedure Laterality Date  . Leg surgery Right     x7  . Amputation Right 09/17/2013    Procedure: PARTIAL AMPUTATION 2ND TOE RIGHT FOOT;  Surgeon: Dallas Schimke, DPM;  Location: [facility];  Service: Podiatry;  Laterality: Right;   Family History  Problem Relation Age of Onset  . Bipolar disorder Mother    Social History  Substance Use Topics  . Smoking status: Current Every Day Smoker -- 1.00 packs/day for 14 years    Types: Cigarettes  . Smokeless tobacco: Former Neurosurgeon  . Alcohol Use: Yes     Comment: Per patient 0.5 gallon to gallon when she can but since being in group home she can't do it as often    OB History    Gravida Para Term Preterm AB TAB SAB Ectopic Multiple Living   Review of Systems  Reason unable to perform ROS: psychosis.      Allergies  Review of patient's allergies indicates no known allergies.  Home Medications   Prior to Admission medications   Medication Sig Start Date End Date Taking? Authorizing Provider  divalproex (DEPAKOTE ER) 250 MG 24 hr  tablet Take 1 tablet (250 mg total) by mouth daily. Patient not taking: Reported on 12/30/2014 08/01/14   Jerelyn Scott, MD  docusate sodium 100 MG CAPS Take 100 mg by mouth 2 (two) times daily. Patient not taking: Reported on 08/01/2014 09/20/13   Osvaldo Shipper, MD   BP 95/67 mmHg  Pulse 62  Temp(Src) 98.3 F (36.8 C) (Oral)  Resp 16  SpO2 100% Physical Exam  Constitutional: She is oriented to person, place, and time. She appears well-developed and well-nourished.  HENT:  Head: Normocephalic and atraumatic.  Eyes: Conjunctivae and EOM are normal. Pupils are equal, round, and reactive to light.  Neck: Normal range of motion. Neck supple.  Cardiovascular: Normal rate and regular rhythm.   Pulmonary/Chest: Effort normal and breath sounds normal.  Abdominal: Soft. Bowel sounds are normal.  Musculoskeletal: Normal range of motion.  Neurological: She is alert and oriented to person, place, and time.  Skin: Skin is warm and dry.  Psychiatric:   Flight of ideas, paranoid and delusional thinking.  Nursing note and vitals reviewed.   ED Course  Procedures (including critical care time) Labs Review Labs Reviewed  CBC WITH DIFFERENTIAL/PLATELET - Abnormal; Notable for the following:    RBC 3.80 (*)    MCV 107.6 (*)    MCH 35.3 (*)    All other components  within normal limits  COMPREHENSIVE METABOLIC PANEL - Abnormal; Notable for the following:    BUN <5 (*)    All other components within normal limits  ETHANOL - Abnormal; Notable for the following:    Alcohol, Ethyl (B) 76 (*)    All other components within normal limits  URINALYSIS, ROUTINE W REFLEX MICROSCOPIC (NOT AT [facility])  PREGNANCY, URINE  URINE RAPID DRUG SCREEN, HOSP PERFORMED    Imaging Review No results found. I have personally reviewed and evaluated these images and lab results as part of my medical decision-making.   EKG Interpretation None     CRITICAL CARE Performed by: Donnetta Hutching Total critical care time: 30  minutes Critical care time was exclusive of separately billable procedures and treating other patients. Critical care was necessary to treat or prevent imminent or life-threatening deterioration. Critical care was time spent personally by me on the following activities: development of treatment plan with patient and/or surrogate as well as nursing, discussions with consultants, evaluation of patient's response to treatment, examination of patient, obtaining history from patient or surrogate, ordering and performing treatments and interventions, ordering and review of laboratory studies, ordering and review of radiographic studies, pulse oximetry and re-evaluation of patient's condition. MDM   Final diagnoses:  Psychosis, unspecified psychosis type    Patient is psychotic. Involuntary commitment papers signed. Intramuscular Haldol administered.    Donnetta Hutching, MD 03/15/15 1610  Donnetta Hutching, MD 03/15/15 [phone]

## 2015-03-15 NOTE — ED Notes (Signed)
Staffing notified of sitter case.  

## 2015-03-15 NOTE — ED Notes (Signed)
Pt reminded of need for urine sample, and pt refused

## 2015-03-15 NOTE — ED Notes (Signed)
Pt becoming increasingly agitated and continually asking for "a shot."  EDP notified.

## 2015-03-15 NOTE — ED Notes (Signed)
Bed: UJ81 Expected date:  Expected time:  Means of arrival:  Comments: EMS- IVC

## 2015-03-15 NOTE — ED Notes (Signed)
Pt calming down. Asked for water. Cup of ice water provided.

## 2015-03-15 NOTE — ED Notes (Signed)
Pt is alert and oriented times 4. Pt denies SI or HI. Pt cooperative during assessment, but lethargic

## 2015-03-15 NOTE — ED Notes (Signed)
Per EMS: pt walked up to EMS base and pt demanded to get into the evidence locker, saying stuff had been stolen from her. Pt had white substance under and in left nostril. Pt has legal guardian by the name of Pam Drown [phone]. Pt appears to be maniac and has flight of ideas.

## 2015-03-15 NOTE — BH Assessment (Signed)
[facility] Assessment Progress Note   Clinician went in room at 19:52 but patient was too sleepy to be assessed at this time.  Pt has been administered haldol about an hour ago.

## 2015-03-16 DIAGNOSIS — F250 Schizoaffective disorder, bipolar type: Secondary | ICD-10-CM | POA: Diagnosis not present

## 2015-03-16 DIAGNOSIS — F22 Delusional disorders: Secondary | ICD-10-CM | POA: Diagnosis not present

## 2015-03-16 LAB — URINALYSIS, ROUTINE W REFLEX MICROSCOPIC
Glucose, UA: NEGATIVE mg/dL
KETONES UR: 15 mg/dL — AB
Leukocytes, UA: NEGATIVE
NITRITE: NEGATIVE
PROTEIN: NEGATIVE mg/dL
SPECIFIC GRAVITY, URINE: 1.022 (ref 1.005–1.030)
pH: 5.5 (ref 5.0–8.0)

## 2015-03-16 LAB — RAPID URINE DRUG SCREEN, HOSP PERFORMED
Amphetamines: NOT DETECTED
Barbiturates: NOT DETECTED
Benzodiazepines: POSITIVE — AB
Cocaine: NOT DETECTED
OPIATES: NOT DETECTED
Tetrahydrocannabinol: NOT DETECTED

## 2015-03-16 LAB — URINE MICROSCOPIC-ADD ON

## 2015-03-16 LAB — PREGNANCY, URINE: PREG TEST UR: NEGATIVE

## 2015-03-16 MED ORDER — LURASIDONE HCL 40 MG PO TABS
40.0000 mg | ORAL_TABLET | Freq: Every day | ORAL | Status: DC
  Administered 2015-03-16: 40 mg via ORAL
  Filled 2015-03-16 (×2): qty 1

## 2015-03-16 MED ORDER — HYDROXYZINE HCL 25 MG PO TABS
25.0000 mg | ORAL_TABLET | Freq: Three times a day (TID) | ORAL | Status: DC | PRN
  Filled 2015-03-16: qty 1

## 2015-03-16 MED ORDER — GABAPENTIN 300 MG PO CAPS
300.0000 mg | ORAL_CAPSULE | Freq: Two times a day (BID) | ORAL | Status: DC
  Administered 2015-03-16 (×2): 300 mg via ORAL
  Filled 2015-03-16 (×2): qty 1

## 2015-03-16 MED ORDER — LURASIDONE HCL 40 MG PO TABS: 40.0000 mg | ORAL_TABLET | Freq: Every day | ORAL | Status: DC

## 2015-03-16 NOTE — ED Notes (Signed)
On the phone to talk w/ her Daryll Brod

## 2015-03-16 NOTE — Consult Note (Signed)
[facility] Consult   Reason for Consult:  Anxiety Referring Physician: EDP Patient Identification: Sylvia [name] MRN:  [id number] Principal Diagnosis: Schizoaffective disorder, bipolar type [facility]) Diagnosis:   Patient Active Problem List   Diagnosis Date Noted  . Schizoaffective disorder, bipolar type ([place]) [F25.0] 03/16/2015    Priority: High  . Schizoaffective schizophrenia ([place]) [F25.0] 09/17/2013  . Seizure disorder ([place]) [M21.115] 09/17/2013  . Osteomyelitis of toe of right foot ([place]) [M86.9] 09/16/2013  . Cellulitis and abscess of toe of right foot [L03.031, L02.611] 09/16/2013  . Toe pain, right [M79.674] 09/14/2013  . Hepatitis C [B19.20]   . CP (cerebral palsy) ([place]) [G80.9]     Total Time spent with patient: 45 minutes  Subjective:   Sylvia [name] is a 30 y.o. female patient admitted with Manic symptoms, Anxiety  HPI:  Caucasian female, 30 years old was evaluated this morning after she came to the ER asking to be allowed in the evidence box.   On arrival to the ER it was documented that she had white substance under and in her left Nostril.  Patient admitted that she relapsed on Cocaine 6 months ago and has been using off and on.   She stated that she is tired of Police harrassment because she always goes to the wrong place. She reports a diagnosis of Bipolar disorder and states that she has not been getting her medications from her ACT team.  Patient is also angry that she has not received her $40 a  Week fund from [employer].  In 2 weeks.  She stated that she "flipped out" because she has not gotten her money.  Patient has a guardian but lives alone.  She has been accepted for admission and we will be seeking placement at any facility with available bed.  Past Psychiatric History:   Bipolar disorder, Polysubstance abuse  Risk to Self: Is patient at risk for suicide?: No, but patient needs Medical Clearance Risk to Others:   Prior Inpatient Therapy:   Prior  Outpatient Therapy:    Past Medical History:  Past Medical History  Diagnosis Date  . CP (cerebral palsy) ([place])   . Hepatitis C   . Herpes   . Stroke ([place])   . Seizures ([place])   . Schizoaffective schizophrenia ([place]) 09/17/2013    Per notes from [facility] as on 2014     Past Surgical History  Procedure Laterality Date  . Leg surgery Right     x7  . Amputation Right 09/17/2013    Procedure: PARTIAL AMPUTATION 2ND TOE RIGHT FOOT;  Surgeon: Marcheta Grammes, DPM;  Location: [facility];  Service: Podiatry;  Laterality: Right;   Family History:  Family History  Problem Relation Age of Onset  . Bipolar disorder Mother    Family Psychiatric  History: unknown Social History:  History  Alcohol Use  . Yes    Comment: Per patient 0.5 gallon to gallon when she can but since being in group home she can't do it as often      History  Drug Use No    Comment: Per patient used to use herion and crack but none in "a few years"     Social History   Social History  . Marital Status: Single    Spouse Name: N/A  . Number of Children: N/A  . Years of Education: N/A   Social History Main Topics  . Smoking status: Current Every Day Smoker -- 1.00 packs/day for 14 years  Types: Cigarettes  . Smokeless tobacco: Former Systems developer  . Alcohol Use: Yes     Comment: Per patient 0.5 gallon to gallon when she can but since being in group home she can't do it as often   . Drug Use: No     Comment: Per patient used to use herion and crack but none in "a few years"   . Sexual Activity: Not Asked   Other Topics Concern  . None   Social History Narrative   Additional Social History:    Allergies:  No Known Allergies  Labs:  Results for orders placed or performed during the hospital encounter of 03/15/15 (from the past 48 hour(s))  CBC with Differential     Status: Abnormal   Collection Time: 03/15/15  5:41 PM  Result Value Ref Range   WBC 5.2 4.0 - 10.5 K/uL   RBC 3.80 (L) 3.87 - 5.11 MIL/uL    Hemoglobin 13.4 12.0 - 15.0 g/dL   HCT 40.9 36.0 - 46.0 %   MCV 107.6 (H) 78.0 - 100.0 fL   MCH 35.3 (H) 26.0 - 34.0 pg   MCHC 32.8 30.0 - 36.0 g/dL   RDW 13.5 11.5 - 15.5 %   Platelets 230 150 - 400 K/uL   Neutrophils Relative % 45 %   Neutro Abs 2.3 1.7 - 7.7 K/uL   Lymphocytes Relative 46 %   Lymphs Abs 2.4 0.7 - 4.0 K/uL   Monocytes Relative 7 %   Monocytes Absolute 0.4 0.1 - 1.0 K/uL   Eosinophils Relative 2 %   Eosinophils Absolute 0.1 0.0 - 0.7 K/uL   Basophils Relative 0 %   Basophils Absolute 0.0 0.0 - 0.1 K/uL  Comprehensive metabolic panel     Status: Abnormal   Collection Time: 03/15/15  5:41 PM  Result Value Ref Range   Sodium 145 135 - 145 mmol/L   Potassium 3.9 3.5 - 5.1 mmol/L   Chloride 110 101 - 111 mmol/L   CO2 23 22 - 32 mmol/L   Glucose, Bld 76 65 - 99 mg/dL   BUN <5 (L) 6 - 20 mg/dL   Creatinine, Ser 0.74 0.44 - 1.00 mg/dL   Calcium 9.7 8.9 - 10.3 mg/dL   Total Protein 7.8 6.5 - 8.1 g/dL   Albumin 4.6 3.5 - 5.0 g/dL   AST 40 15 - 41 U/L   ALT 28 14 - 54 U/L   Alkaline Phosphatase 62 38 - 126 U/L   Total Bilirubin 0.4 0.3 - 1.2 mg/dL   GFR calc non Af Amer >60 >60 mL/min   GFR calc Af Amer >60 >60 mL/min    Comment: (NOTE) The eGFR has been calculated using the CKD EPI equation. This calculation has not been validated in all clinical situations. eGFR's persistently <60 mL/min signify possible Chronic Kidney Disease.    Anion gap 12 5 - 15  Ethanol     Status: Abnormal   Collection Time: 03/15/15  5:41 PM  Result Value Ref Range   Alcohol, Ethyl (B) 76 (H) <5 mg/dL    Comment:        LOWEST DETECTABLE LIMIT FOR SERUM ALCOHOL IS 5 mg/dL FOR MEDICAL PURPOSES ONLY   Urinalysis, Routine w reflex microscopic     Status: Abnormal   Collection Time: 03/16/15 10:16 AM  Result Value Ref Range   Color, Urine AMBER (A) YELLOW    Comment: BIOCHEMICALS MAY BE AFFECTED BY COLOR   APPearance CLOUDY (A) CLEAR   Specific  Gravity, Urine 1.022 1.005 - 1.030    pH 5.5 5.0 - 8.0   Glucose, UA NEGATIVE NEGATIVE mg/dL   Hgb urine dipstick LARGE (A) NEGATIVE   Bilirubin Urine SMALL (A) NEGATIVE   Ketones, ur 15 (A) NEGATIVE mg/dL   Protein, ur NEGATIVE NEGATIVE mg/dL   Nitrite NEGATIVE NEGATIVE   Leukocytes, UA NEGATIVE NEGATIVE  Pregnancy, urine     Status: None   Collection Time: 03/16/15 10:16 AM  Result Value Ref Range   Preg Test, Ur NEGATIVE NEGATIVE    Comment:        THE SENSITIVITY OF THIS METHODOLOGY IS >20 mIU/mL.   Urine rapid drug screen (hosp performed)     Status: Abnormal   Collection Time: 03/16/15 10:16 AM  Result Value Ref Range   Opiates NONE DETECTED NONE DETECTED   Cocaine NONE DETECTED NONE DETECTED   Benzodiazepines POSITIVE (A) NONE DETECTED   Amphetamines NONE DETECTED NONE DETECTED   Tetrahydrocannabinol NONE DETECTED NONE DETECTED   Barbiturates NONE DETECTED NONE DETECTED    Comment:        DRUG SCREEN FOR MEDICAL PURPOSES ONLY.  IF CONFIRMATION IS NEEDED FOR ANY PURPOSE, NOTIFY LAB WITHIN 5 DAYS.        LOWEST DETECTABLE LIMITS FOR URINE DRUG SCREEN Drug Class       Cutoff (ng/mL) Amphetamine      1000 Barbiturate      200 Benzodiazepine   409 Tricyclics       811 Opiates          300 Cocaine          300 THC              50   Urine microscopic-add on     Status: Abnormal   Collection Time: 03/16/15 10:16 AM  Result Value Ref Range   Squamous Epithelial / LPF 6-30 (A) NONE SEEN   WBC, UA 0-5 0 - 5 WBC/hpf   RBC / HPF 6-30 0 - 5 RBC/hpf   Bacteria, UA MANY (A) NONE SEEN   Urine-Other MUCOUS PRESENT     Current Facility-Administered Medications  Medication Dose Route Frequency Provider Last Rate Last Dose  . gabapentin (NEURONTIN) capsule 300 mg  300 mg Oral BID Kurtis Anastasia   300 mg at 03/16/15 1315  . hydrOXYzine (ATARAX/VISTARIL) tablet 25 mg  25 mg Oral TID PRN Jaeleen Inzunza      . lurasidone (LATUDA) tablet 40 mg  40 mg Oral QHS Jasemine Nawaz       Current Outpatient  Prescriptions  Medication Sig Dispense Refill  . benztropine (COGENTIN) 0.5 MG tablet Take 0.5 mg by mouth 2 (two) times daily.    . risperiDONE (RISPERDAL) 3 MG tablet Take 6 mg by mouth at bedtime.    . citalopram (CELEXA) 40 MG tablet Take 40 mg by mouth daily. Reported on 03/16/2015    . divalproex (DEPAKOTE ER) 250 MG 24 hr tablet Take 1 tablet (250 mg total) by mouth daily. (Patient not taking: Reported on 12/30/2014) 30 tablet 0  . divalproex (DEPAKOTE ER) 500 MG 24 hr tablet Take 500 mg by mouth at bedtime. Reported on 03/16/2015    . docusate sodium 100 MG CAPS Take 100 mg by mouth 2 (two) times daily. (Patient not taking: Reported on 08/01/2014) 60 capsule 0  . gabapentin (NEURONTIN) 300 MG capsule Take 300 mg by mouth 3 (three) times daily as needed. Reported on 03/16/2015    . pantoprazole (PROTONIX) 20  MG tablet Take 20 mg by mouth daily. Reported on 03/16/2015    . QUEtiapine (SEROQUEL) 25 MG tablet Take 25 mg by mouth 2 (two) times daily as needed (depression; bipolar). Reported on 03/16/2015      Musculoskeletal: Strength & Muscle Tone: within normal limits Gait & Station: normal Patient leans: N/A  Psychiatric Specialty Exam: Review of Systems  Constitutional: Negative.   HENT: Negative.   Eyes: Negative.   Respiratory: Negative.   Cardiovascular: Negative.   Gastrointestinal: Negative.   Genitourinary: Negative.   Musculoskeletal: Negative.   Skin: Negative.   Neurological: Negative.   Endo/Heme/Allergies: Negative.     Blood pressure 106/57, pulse 57, temperature 98.2 F (36.8 C), temperature source Oral, resp. rate 18, SpO2 99 %.There is no weight on file to calculate BMI.  General Appearance: Casual and Disheveled  Eye Contact::  Minimal  Speech:  Clear and Coherent and Pressured  Volume:  Normal  Mood:  Anxious  Affect:  Congruent  Thought Process:  Coherent and Loose  Orientation:  Full (Time, Place, and Person)  Thought Content:  WDL  Suicidal Thoughts:   No  Homicidal Thoughts:  No  Memory:  Immediate;   Good Recent;   Good Remote;   Good  Judgement:  Fair  Insight:  Good  Psychomotor Activity:  Normal  Concentration:  Good  Recall:  NA  Fund of Knowledge:Good  Language: Good  Akathisia:  NA  Handed:  Right  AIMS (if indicated):     Assets:  Desire for Improvement  ADL's:  Impaired  Cognition: WNL  Sleep:      Treatment Plan Summary: Daily contact with patient to assess and evaluate symptoms and progress in treatment and Medication management  Disposition:  Accepted for admission and we will be seeking placement at any facility with available bed.  We have resumes all of her home medications.  Delfin Gant   PMHNP-BC 03/16/2015 2:21 PM Patient seen face-to-face for psychiatric evaluation, chart reviewed and case discussed with the physician extender and developed treatment plan. Reviewed the information documented and agree with the treatment plan. Corena Pilgrim, MD

## 2015-03-16 NOTE — ED Notes (Signed)
Unable to void, pt reports she has had vaginal bleeding for the past 3-4 months and has not been able to get an appt. To get it checked.  PO fluids encouraged.

## 2015-03-16 NOTE — BH Assessment (Signed)
Received a call from [place] requesting additional information. Faxed information to [facility].

## 2015-03-16 NOTE — ED Notes (Signed)
Dr A and Josephine NP into see 

## 2015-03-16 NOTE — ED Notes (Signed)
Up to the bathroom 

## 2015-03-16 NOTE — ED Notes (Signed)
Sylvia [name] from [facility] (pt ACT team) and requests that some one contact them w/ dispositions-can be reached at [phone] after hours number

## 2015-03-16 NOTE — Progress Notes (Signed)
CSW staffed with the psychiatry team. CSW informed to contact the Legal Doris Cheadle [phone] regarding patient's money. Guardian reports she resides in [place], [place]. CSW informed guardian that patient is asking about $40 dollars. She reports patient receives $733 per week for disability. She reports patient receives $40 per week for an allowance. She reports patient's money was going to the [facility], however, her money is now going to the [facility] [address] [place] [address]. She reports she has paid for her to stay at the hotel for sixty days. She reports her first day at the hotel was January 13th. She reports [employer] is the payee for patient and they provide her the $40 allowance.  Guardian reports patient was under the care of DSS in [facility] She reports she did not like the care they were providing for patient. Patient reports patient has a IT trainer with [facility].   Guardian reports patient has family here in [place], however, they do not help patient. She reports patient is from [place] and she lived in [place] for two years.   CSW provided patient with the contact number to the nurses station [phone]) [phone] to speak with patient. CSW also provided her with the fax# [phone] to fax the guardian paperwork. No questions noted for CSW at this time.   Elenore Paddy [phone] ED CSW 03/16/2015 3:42 PM

## 2015-03-16 NOTE — ED Notes (Signed)
Pharmacy tech into see 

## 2015-03-16 NOTE — Progress Notes (Signed)
CSW attempted to speak with Pam Drown, Legal Guardian at [phone] regarding patient. CSW left name and contact number for return call.  Elenore Paddy [phone] ED CSW 03/16/2015 12:27 PM

## 2015-03-16 NOTE — ED Notes (Signed)
Up to the bathroom w/o difficulty 

## 2015-03-16 NOTE — ED Notes (Signed)
Patient appears flat, cooperative. Denies sI, HI, AVH. Denies any feelings of anxiety or depression. Has had AVH previously. Voiced concern over no bm on 1/26. No pain or discomfort noted. Last bm 1/25.  Encouragement offered. Prune juice given.  Q 15 safety checks continue.

## 2015-03-16 NOTE — ED Notes (Signed)
Pt presents with psychotic behavior, given Haldol in main ed. Pt with hx of Cerebral Palsy, monitoring for safety, Q 15 min checks in effect.

## 2015-03-17 ENCOUNTER — Inpatient Hospital Stay (HOSPITAL_COMMUNITY)
Admission: EM | Admit: 2015-03-17 | Discharge: 2015-03-24 | DRG: 885 | Disposition: A | Discharge status: Home / Self Care | Payer: Medicare Other | Source: Intra-hospital | Attending: Psychiatry | Admitting: Psychiatry

## 2015-03-17 ENCOUNTER — Encounter (HOSPITAL_COMMUNITY): Payer: Self-pay | Admitting: *Deleted

## 2015-03-17 DIAGNOSIS — F259 Schizoaffective disorder, unspecified: Secondary | ICD-10-CM | POA: Diagnosis present

## 2015-03-17 DIAGNOSIS — F17210 Nicotine dependence, cigarettes, uncomplicated: Secondary | ICD-10-CM | POA: Diagnosis present

## 2015-03-17 DIAGNOSIS — F1210 Cannabis abuse, uncomplicated: Secondary | ICD-10-CM | POA: Diagnosis present

## 2015-03-17 DIAGNOSIS — R4183 Borderline intellectual functioning: Secondary | ICD-10-CM | POA: Diagnosis not present

## 2015-03-17 DIAGNOSIS — F1020 Alcohol dependence, uncomplicated: Secondary | ICD-10-CM | POA: Diagnosis not present

## 2015-03-17 DIAGNOSIS — Z8673 Personal history of transient ischemic attack (TIA), and cerebral infarction without residual deficits: Secondary | ICD-10-CM

## 2015-03-17 DIAGNOSIS — G809 Cerebral palsy, unspecified: Secondary | ICD-10-CM | POA: Diagnosis present

## 2015-03-17 DIAGNOSIS — F250 Schizoaffective disorder, bipolar type: Secondary | ICD-10-CM | POA: Diagnosis not present

## 2015-03-17 DIAGNOSIS — F1420 Cocaine dependence, uncomplicated: Secondary | ICD-10-CM | POA: Diagnosis present

## 2015-03-17 MED ORDER — NICOTINE 21 MG/24HR TD PT24
21.0000 mg | MEDICATED_PATCH | Freq: Every day | TRANSDERMAL | Status: DC
  Administered 2015-03-17 – 2015-03-19 (×3): 21 mg via TRANSDERMAL
  Filled 2015-03-17 (×7): qty 1

## 2015-03-17 MED ORDER — MAGNESIUM HYDROXIDE 400 MG/5ML PO SUSP
30.0000 mL | Freq: Every day | ORAL | Status: DC | PRN
  Administered 2015-03-20 – 2015-03-22 (×2): 30 mL via ORAL
  Filled 2015-03-17 (×2): qty 30

## 2015-03-17 MED ORDER — HYDROXYZINE HCL 25 MG PO TABS
25.0000 mg | ORAL_TABLET | Freq: Four times a day (QID) | ORAL | Status: AC | PRN
  Administered 2015-03-19: 25 mg via ORAL
  Filled 2015-03-17: qty 1

## 2015-03-17 MED ORDER — HYDROXYZINE HCL 50 MG PO TABS: 50.0000 mg | ORAL_TABLET | Freq: Three times a day (TID) | ORAL | Status: DC | PRN

## 2015-03-17 MED ORDER — RISPERIDONE 3 MG PO TABS
6.0000 mg | ORAL_TABLET | Freq: Every day | ORAL | Status: DC
  Administered 2015-03-17 – 2015-03-23 (×7): 6 mg via ORAL
  Filled 2015-03-17 (×9): qty 2

## 2015-03-17 MED ORDER — ACETAMINOPHEN 325 MG PO TABS
650.0000 mg | ORAL_TABLET | Freq: Four times a day (QID) | ORAL | Status: DC | PRN
  Administered 2015-03-17: 650 mg via ORAL
  Filled 2015-03-17: qty 2

## 2015-03-17 MED ORDER — CHLORDIAZEPOXIDE HCL 25 MG PO CAPS
25.0000 mg | ORAL_CAPSULE | Freq: Three times a day (TID) | ORAL | Status: AC
  Administered 2015-03-19 (×2): 25 mg via ORAL
  Filled 2015-03-17 (×2): qty 1

## 2015-03-17 MED ORDER — QUETIAPINE FUMARATE 25 MG PO TABS: 25.0000 mg | ORAL_TABLET | Freq: Two times a day (BID) | ORAL | Status: DC | PRN

## 2015-03-17 MED ORDER — GABAPENTIN 100 MG PO CAPS
100.0000 mg | ORAL_CAPSULE | ORAL | Status: DC
  Administered 2015-03-17 – 2015-03-19 (×8): 100 mg via ORAL
  Filled 2015-03-17 (×13): qty 1

## 2015-03-17 MED ORDER — POLYETHYLENE GLYCOL 3350 17 G PO PACK
17.0000 g | PACK | Freq: Every day | ORAL | Status: DC
  Administered 2015-03-17 – 2015-03-22 (×5): 17 g via ORAL
  Filled 2015-03-17 (×11): qty 1

## 2015-03-17 MED ORDER — VITAMIN B-1 100 MG PO TABS
100.0000 mg | ORAL_TABLET | Freq: Every day | ORAL | Status: DC
  Administered 2015-03-19 – 2015-03-24 (×6): 100 mg via ORAL
  Filled 2015-03-17 (×10): qty 1

## 2015-03-17 MED ORDER — ONDANSETRON 4 MG PO TBDP: 4.0000 mg | ORAL_TABLET | Freq: Four times a day (QID) | ORAL | Status: AC | PRN

## 2015-03-17 MED ORDER — CHLORDIAZEPOXIDE HCL 25 MG PO CAPS
25.0000 mg | ORAL_CAPSULE | Freq: Four times a day (QID) | ORAL | Status: AC
Start: 2015-03-17 — End: 2015-03-19
  Administered 2015-03-17 – 2015-03-18 (×3): 25 mg via ORAL
  Filled 2015-03-17 (×3): qty 1

## 2015-03-17 MED ORDER — CHLORDIAZEPOXIDE HCL 25 MG PO CAPS
25.0000 mg | ORAL_CAPSULE | Freq: Every day | ORAL | Status: AC
  Administered 2015-03-21: 25 mg via ORAL
  Filled 2015-03-17: qty 1

## 2015-03-17 MED ORDER — THIAMINE HCL 100 MG/ML IJ SOLN
100.0000 mg | Freq: Once | INTRAMUSCULAR | Status: AC
  Administered 2015-03-17: 100 mg via INTRAMUSCULAR
  Filled 2015-03-17: qty 2

## 2015-03-17 MED ORDER — ADULT MULTIVITAMIN W/MINERALS CH
1.0000 | ORAL_TABLET | Freq: Every day | ORAL | Status: DC
  Administered 2015-03-17 – 2015-03-24 (×7): 1 via ORAL
  Filled 2015-03-17 (×11): qty 1

## 2015-03-17 MED ORDER — CHLORDIAZEPOXIDE HCL 25 MG PO CAPS
25.0000 mg | ORAL_CAPSULE | ORAL | Status: AC
  Administered 2015-03-20 (×2): 25 mg via ORAL
  Filled 2015-03-17 (×2): qty 1

## 2015-03-17 MED ORDER — DIVALPROEX SODIUM ER 500 MG PO TB24
500.0000 mg | ORAL_TABLET | Freq: Every day | ORAL | Status: DC
  Administered 2015-03-17: 500 mg via ORAL
  Filled 2015-03-17 (×4): qty 1

## 2015-03-17 MED ORDER — ALUM & MAG HYDROXIDE-SIMETH 200-200-20 MG/5ML PO SUSP: 30.0000 mL | ORAL | Status: DC | PRN

## 2015-03-17 MED ORDER — BENZTROPINE MESYLATE 0.5 MG PO TABS
0.5000 mg | ORAL_TABLET | Freq: Every day | ORAL | Status: DC
  Administered 2015-03-17 – 2015-03-24 (×8): 0.5 mg via ORAL
  Filled 2015-03-17 (×10): qty 1

## 2015-03-17 MED ORDER — LORAZEPAM 1 MG PO TABS: 1.0000 mg | ORAL_TABLET | ORAL | Status: DC | PRN

## 2015-03-17 MED ORDER — CITALOPRAM HYDROBROMIDE 40 MG PO TABS
40.0000 mg | ORAL_TABLET | Freq: Every day | ORAL | Status: DC
  Administered 2015-03-17 – 2015-03-23 (×7): 40 mg via ORAL
  Filled 2015-03-17 (×13): qty 1

## 2015-03-17 MED ORDER — CHLORDIAZEPOXIDE HCL 25 MG PO CAPS: 25.0000 mg | ORAL_CAPSULE | Freq: Four times a day (QID) | ORAL | Status: AC | PRN

## 2015-03-17 MED ORDER — LOPERAMIDE HCL 2 MG PO CAPS: 2.0000 mg | ORAL_CAPSULE | ORAL | Status: AC | PRN

## 2015-03-17 NOTE — Tx Team (Signed)
Initial Interdisciplinary Treatment Plan   PATIENT STRESSORS: Financial difficulties Legal issue Marital or family conflict Medication change or noncompliance Substance abuse   PATIENT STRENGTHS: Ability for insight Average or above average intelligence Capable of independent living General fund of knowledge Motivation for treatment/growth Physical Health Religious Affiliation Supportive family/friends Work skills   PROBLEM LIST: Problem List/Patient Goals Date to be addressed Date deferred Reason deferred Estimated date of resolution  "Just to be, I mean I don't know. I just had a breakdown. I just need to focus, function, and pay attention", 03/17/15     "Get my meds right" 03/17/15           Increased risk for suicide 03/17/15     Alteration in thought process 03/17/15                              DISCHARGE CRITERIA:  Ability to meet basic life and health needs Adequate post-discharge living arrangements Improved stabilization in mood, thinking, and/or behavior Medical problems require only outpatient monitoring Motivation to continue treatment in a less acute level of care Need for constant or close observation no longer present Reduction of life-threatening or endangering symptoms to within safe limits Safe-care adequate arrangements made Verbal commitment to aftercare and medication compliance Withdrawal symptoms are absent or subacute and managed without 24-hour nursing intervention  PRELIMINARY DISCHARGE PLAN: Attend 12-step recovery group Return to previous living arrangement  PATIENT/FAMIILY INVOLVEMENT: This treatment plan has been presented to and reviewed with the patient, Sylvia [name], and/or family member.  The patient and family have been given the opportunity to ask questions and make suggestions.  Fransico Michael Laverne 03/17/2015, 4:28 AM
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
## 2015-03-17 NOTE — Progress Notes (Signed)
Patient ID: BLAKELY MARANAN, female   DOB: Jan 10, 1986, 30 y.o.   MRN: [id number] PER STATE REGULATIONS 482.30  THIS CHART WAS REVIEWED FOR MEDICAL NECESSITY WITH RESPECT TO THE PATIENT'S ADMISSION/DURATION OF STAY.  NEXT REVIEW DATE: 03/21/15  Loura Halt, RN, BSN CASE MANAGER

## 2015-03-17 NOTE — Progress Notes (Signed)
Adult Psychoeducational Group Note  Date:  03/17/2015 Time:  9:30 PM  Group Topic/Focus:  Wrap-Up Group:   The focus of this group is to help patients review their daily goal of treatment and discuss progress on daily workbooks.  Participation Level:  Active  Participation Quality:  Appropriate and Attentive  Affect:  Appropriate  Cognitive:  Appropriate  Insight: Appropriate and Good  Engagement in Group:  Engaged  Modes of Intervention:  Discussion  Additional Comments:  Pt rated her day a 7 out of 10. Pt relapse prevention is to stay away from negative people.   Sylvia [name] 03/17/2015, 9:30 PM

## 2015-03-17 NOTE — Tx Team (Signed)
Interdisciplinary Treatment Plan Update (Adult)  Date:  03/17/2015 Time Reviewed:  11:28 AM  Progress in Treatment: Attending groups: Yes. Participating in groups: Yes. Taking medication as prescribed:  Yes. Tolerating medication:  Yes. Family/Significant othe contact made:  Yes, individual(s) contacted:  Sylvia [name] ([place]) [phone] Patient understands diagnosis:  Yes, as evidenced by seeking help with anxiety and medication. Discussing patient identified problems/goals with staff:  Yes, see initial care plan. Medical problems stabilized or resolved:  Yes Denies suicidal/homicidal ideation: Yes. Issues/concerns per patient self-inventory: No. Other:  New problem(s) identified:   Discharge Plan or Barriers: See below   Reason for Continuation of Hospitalization: Anxiety Hallucinations Mania Medication stabilization  Comments: Caucasian female, 30 years old was evaluated this morning after she came to the ER asking to be allowed in the evidence box. On arrival to the ER it was documented that she had white substance under and in her left Nostril. Patient admitted that she relapsed on Cocaine 6 months ago and has been using off and on. She stated that she is tired of Police harrassment because she always goes to the wrong place. She reports a diagnosis of Bipolar disorder and states that she has not been getting her medications from her ACT team. Patient is also angry that she has not received her $40 aweek fund from [employer].for 2 weeks. She stated that she "flipped out" because she has not gotten her money. Patient has a guardian but lives alone. She has been accepted for admission and we will be seeking placement at any facility with available bed. Celexa, Depakote, Neurontin, Risperdal trial   Estimated length of stay: 4-5 days  New goal(s):  Review of initial/current patient goals per problem list:  1. Goal(s): Patient will participate in aftercare  plan  Met:Yes   Target date: at discharge  As evidenced by: Patient will participate within aftercare plan AEB aftercare provider and housing plan at discharge being identified.  03/17/15: Pt will return to extended stay hotel and follow-up outpt with her ACT team.  3. Goal(s): Patient will demonstrate decreased signs and symptoms of anxiety.  Met:No  Target date: at discharge  As evidenced by: Patient will utilize self rating of anxiety at 3 or below and demonstrated decreased signs of anxiety, or be deemed stable for discharge by MD  03/17/15: Pt rates anxiety at 5/10 today.  4. Goal(s): Patient will demonstrate decreased signs of psychosis.  Met: No  Target date:at discharge  As evidenced by: Patient will demonstrate decreased signs of psychosis as evidenced by a reduction in AVH, paranoia, and/or delusions.   03/17/15: Pt denies AVH. However, pt seems to be responding to internal stimuli as evidenced by thought blocking and disorganized thinking.  6. Goal (s): Patient will demonstrate decreased signs of mania  * Met: No  * Target date: 3-5 days post admission date  * As evidenced by: Patient demonstrate decreased signs of mania AEB decreased mood instability and demonstration of stable mood   03/17/15: Pt is still exhibiting pressured speech and labile mood.    Attendees: Patient:  03/17/2015 11:28 AM  Family:   03/17/2015 11:28 AM  Physician:  Dr. Ursula Alert, MD 03/17/2015 11:28 AM  Nursing:  Phillis Haggis, RN  03/17/2015 11:28 AM  Case Manager:  Roque Lias, LCSW 03/17/2015 11:28 AM  Counselor:  Matthew Saras, MSW Intern 03/17/2015 11:28 AM  Other:   03/17/2015 11:28 AM  Other:   03/17/2015 11:28 AM  Other:   03/17/2015 11:28 AM  Other:  03/17/2015 11:28 AM  Other:    Other:    Other:    Other:    Other:    Other:      Scribe for Treatment Team:   Georga Kaufmann, MSW Intern 03/17/2015 11:28 AM

## 2015-03-17 NOTE — Progress Notes (Signed)
  Pt was pleasant and cooperative, but was having difficulty following directions from staff during the adm process.  Stated that she has difficulty focusing. When asked what she'd like to have help with pt stated, she wants help getting her meds right, as well as help focusing.  Pt has right sided weakness at times due to CP.  Pt is also hep C positive. Pt did not appear to be paranoid during the adm process. Pt has no other questions or concerns.
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
## 2015-03-17 NOTE — ED Provider Notes (Signed)
Patient has been accepted at [facility] by Dr. Elna Breslow.   Dione Booze, MD 03/17/15 [phone]

## 2015-03-17 NOTE — BH Assessment (Signed)
Pt has been accepted to [facility] Room 507 Bed 1 to the services of Dr. Elna Breslow. Informed Dr. Preston Fleeting and pt's nurse of the disposition.

## 2015-03-17 NOTE — H&P (Signed)
Psychiatric Admission Assessment Adult  Patient Identification: Sylvia [name]  MRN:  [id number]  Date of Evaluation:  03/17/2015  Chief Complaint:  Schizoaffective Disorder  Principal Diagnosis: Schizoaffective disorder, bipolar type ([place])  Diagnosis:   Patient Active Problem List   Diagnosis Date Noted  . Schizoaffective disorder ([place]) [F25.9] 03/17/2015  . Schizoaffective disorder, bipolar type ([place]) [F25.0] 03/16/2015  . Schizoaffective schizophrenia ([place]) [F25.0] 09/17/2013  . Seizure disorder ([place]) [J94.174] 09/17/2013  . Osteomyelitis of toe of right foot ([place]) [M86.9] 09/16/2013  . Cellulitis and abscess of toe of right foot [L03.031, L02.611] 09/16/2013  . Toe pain, right [M79.674] 09/14/2013  . Hepatitis C [B19.20]   . CP (cerebral palsy) ([place]) [G80.9]    History of Present Illness: This is an admission assessment for this 30 year old Caucasian female. Admitted to [facility] with complaints of Schizoaffective disorder, bipolar-type, manic episodes. During this assessment, Sylvia [name] reports, "I went to the [facility] ED on the 25th of this month. I had a nervous  break down on that day. What happened was, I saw my ex-boyfriend, he had my identification. I had previously given a friend of my mind my identification to hold for me because I don't want to be caught with two IDs. So, when I saw my ex with my ID, I got worried & became afraid,  had a nervous break down. My ex is always stalking & harassing me. He told me that I will be locked-up in mental health institution. We had a baby together, but the baby is with my aunt who also is my payee. My ex is always threatening me. He has served time in prison. I live in a motel now. When I had the nervous break down, I went to a nearby EMS station & asked for help. They brought me to the ED. While at the ED, I went off on a cop who was talking very loud. That is why I was brought to this hospital".  Objective: Sylvia [name] is seen, chart reviewed. She  is alert, oriented x 3 & aware of situation. She does remember being in this hospital as teenager (30 years old), was at the adolescence unit at the time. During this assessment, Sylvia [name] appears to have a borderline intellectual functioning. Her stories seem circumstantial as well as tangential. She required frequent redirections. A review of the ED consult notes presented a different picture to what Sylvia [name] is currently presenting here. She admits having relapsed on cocaine recently. She reports hx of IV drug use. She reports medical hx of cerebral palsy, Hepatitis C & herpes. She also reports that she has hx of vaginal bleed, but none at this time. She currently denies any SIHI or AVH.  Associated Signs/Symptoms:  Depression Symptoms:  depressed mood, anxiety, loss of energy/fatigue,  (Hypo) Manic Symptoms:  Irritable Mood, Labiality of Mood,  Anxiety Symptoms:  Excessive worry  Psychotic Symptoms:  Denies  PTSD Symptoms: NA  Total Time spent with patient: 1 hour  Past Psychiatric History:Schizoaffective disorder  Risk to Self: Is patient at risk for suicide?: No What has been your use of drugs/alcohol within the last 12 months?: Pt drinks a bottle of wine, 12 pack of beer, a few 24 oz beers, and half a bottle of grey goose every two weeks. Pt reports that she has struggled with crack and meth in the past but has not used either in years. Risk to Others: No Prior Inpatient Therapy: Yes Prior Outpatient Therapy: Yes  Alcohol Screening:  1. How often do you have a drink containing alcohol?: 4 or more times a week 2. How many drinks containing alcohol do you have on a typical day when you are drinking?: 3 or 4 3. How often do you have six or more drinks on one occasion?: Never Preliminary Score: 1 Brief Intervention: Patient declined brief intervention  Substance Abuse History in the last 12 months:  Yes.    Consequences of Substance Abuse: Medical Consequences:  Liver damage, Possible  death by overdose Legal Consequences:  Arrests, jail time, Loss of driving privilege. Family Consequences:  Family discord, divorce and or separation.  Previous Psychotropic Medications: Yes   Psychological Evaluations: Yes   Past Medical History:  Past Medical History  Diagnosis Date  . CP (cerebral palsy) ([place])   . Hepatitis C   . Herpes   . Stroke ([place])   . Seizures ([place])   . Schizoaffective schizophrenia ([place]) 09/17/2013    Per notes from [facility] as on 2014     Past Surgical History  Procedure Laterality Date  . Leg surgery Right     x7  . 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 Foot surgery      rt and left   Family History:  Family History  Problem Relation Age of Onset  . Bipolar disorder Mother    Family Psychiatric  History: None reported  Social History:  History  Alcohol Use  . Yes    Comment: Per patient 0.5 gallon to gallon when she can but since being in group home she can't do it as often      History  Drug Use No    Comment: Per patient used to use herion and crack but none in "a few years"     Social History   Social History  . Marital Status: Single    Spouse Name: N/A  . Number of Children: N/A  . Years of Education: N/A   Social History Main Topics  . Smoking status: Current Every Day Smoker -- 0.50 packs/day for 20 years    Types: Cigarettes  . Smokeless tobacco: Former Systems developer  . Alcohol Use: Yes     Comment: Per patient 0.5 gallon to gallon when she can but since being in group home she can't do it as often   . Drug Use: No     Comment: Per patient used to use herion and crack but none in "a few years"   . Sexual Activity: Not Currently    Birth Control/ Protection: Implant   Other Topics Concern  . None   Social History Narrative   Additional Social History: Pain Medications:  none Prescriptions: see mar Over the Counter: see mar History of alcohol / drug use?: Yes Name of Substance 1: etoh 1 - Age of First Use: 10 1 - Frequency: daily 1 - Last Use / Amount: 2 days ago Name of Substance 2: herion 2 - Age of First Use: 19 2 - Last Use / Amount: 5 yr ago Name of Substance 3: cocaine 3 - Age of First Use: 67 3 - Last Use / Amount: 3 months ago  Allergies:  No Known Allergies  Lab Results:  Results for orders placed or performed during the hospital encounter of 03/15/15 (from the past 48 hour(s))  CBC with Differential     Status: Abnormal  Collection Time: 03/15/15  5:41 PM  Result Value Ref Range   WBC 5.2 4.0 - 10.5 K/uL   RBC 3.80 (L) 3.87 - 5.11 MIL/uL   Hemoglobin 13.4 12.0 - 15.0 g/dL   HCT 40.9 36.0 - 46.0 %   MCV 107.6 (H) 78.0 - 100.0 fL   MCH 35.3 (H) 26.0 - 34.0 pg   MCHC 32.8 30.0 - 36.0 g/dL   RDW 13.5 11.5 - 15.5 %   Platelets 230 150 - 400 K/uL   Neutrophils Relative % 45 %   Neutro Abs 2.3 1.7 - 7.7 K/uL   Lymphocytes Relative 46 %   Lymphs Abs 2.4 0.7 - 4.0 K/uL   Monocytes Relative 7 %   Monocytes Absolute 0.4 0.1 - 1.0 K/uL   Eosinophils Relative 2 %   Eosinophils Absolute 0.1 0.0 - 0.7 K/uL   Basophils Relative 0 %   Basophils Absolute 0.0 0.0 - 0.1 K/uL  Comprehensive metabolic panel     Status: Abnormal   Collection Time: 03/15/15  5:41 PM  Result Value Ref Range   Sodium 145 135 - 145 mmol/L   Potassium 3.9 3.5 - 5.1 mmol/L   Chloride 110 101 - 111 mmol/L   CO2 23 22 - 32 mmol/L   Glucose, Bld 76 65 - 99 mg/dL   BUN <5 (L) 6 - 20 mg/dL   Creatinine, Ser 0.74 0.44 - 1.00 mg/dL   Calcium 9.7 8.9 - 10.3 mg/dL   Total Protein 7.8 6.5 - 8.1 g/dL   Albumin 4.6 3.5 - 5.0 g/dL   AST 40 15 - 41 U/L   ALT 28 14 - 54 U/L   Alkaline Phosphatase 62 38 - 126 U/L   Total Bilirubin 0.4 0.3 - 1.2 mg/dL   GFR calc non Af Amer >60 >60 mL/min   GFR calc Af Amer >60 >60 mL/min    Comment: (NOTE) The eGFR has been calculated  using the CKD EPI equation. This calculation has not been validated in all clinical situations. eGFR's persistently <60 mL/min signify possible Chronic Kidney Disease.    Anion gap 12 5 - 15  Ethanol     Status: Abnormal   Collection Time: 03/15/15  5:41 PM  Result Value Ref Range   Alcohol, Ethyl (B) 76 (H) <5 mg/dL    Comment:        LOWEST DETECTABLE LIMIT FOR SERUM ALCOHOL IS 5 mg/dL FOR MEDICAL PURPOSES ONLY   Urinalysis, Routine w reflex microscopic     Status: Abnormal   Collection Time: 03/16/15 10:16 AM  Result Value Ref Range   Color, Urine AMBER (A) YELLOW    Comment: BIOCHEMICALS MAY BE AFFECTED BY COLOR   APPearance CLOUDY (A) CLEAR   Specific Gravity, Urine 1.022 1.005 - 1.030   pH 5.5 5.0 - 8.0   Glucose, UA NEGATIVE NEGATIVE mg/dL   Hgb urine dipstick LARGE (A) NEGATIVE   Bilirubin Urine SMALL (A) NEGATIVE   Ketones, ur 15 (A) NEGATIVE mg/dL   Protein, ur NEGATIVE NEGATIVE mg/dL   Nitrite NEGATIVE NEGATIVE   Leukocytes, UA NEGATIVE NEGATIVE  Pregnancy, urine     Status: None   Collection Time: 03/16/15 10:16 AM  Result Value Ref Range   Preg Test, Ur NEGATIVE NEGATIVE    Comment:        THE SENSITIVITY OF THIS METHODOLOGY IS >20 mIU/mL.   Urine rapid drug screen (hosp performed)     Status: Abnormal   Collection Time: 03/16/15 10:16 AM  Result Value Ref Range   Opiates NONE DETECTED NONE DETECTED   Cocaine NONE DETECTED NONE DETECTED   Benzodiazepines POSITIVE (A) NONE DETECTED   Amphetamines NONE DETECTED NONE DETECTED   Tetrahydrocannabinol NONE DETECTED NONE DETECTED   Barbiturates NONE DETECTED NONE DETECTED    Comment:        DRUG SCREEN FOR MEDICAL PURPOSES ONLY.  IF CONFIRMATION IS NEEDED FOR ANY PURPOSE, NOTIFY LAB WITHIN 5 DAYS.        LOWEST DETECTABLE LIMITS FOR URINE DRUG SCREEN Drug Class       Cutoff (ng/mL) Amphetamine      1000 Barbiturate      200 Benzodiazepine   196 Tricyclics       222 Opiates          300 Cocaine           300 THC              50   Urine microscopic-add on     Status: Abnormal   Collection Time: 03/16/15 10:16 AM  Result Value Ref Range   Squamous Epithelial / LPF 6-30 (A) NONE SEEN   WBC, UA 0-5 0 - 5 WBC/hpf   RBC / HPF 6-30 0 - 5 RBC/hpf   Bacteria, UA MANY (A) NONE SEEN   Urine-Other MUCOUS PRESENT    Metabolic Disorder Labs:  No results found for: HGBA1C, MPG No results found for: PROLACTIN No results found for: CHOL, TRIG, HDL, CHOLHDL, VLDL, LDLCALC  Current Medications: Current Facility-Administered Medications  Medication Dose Route Frequency Provider Last Rate Last Dose  . acetaminophen (TYLENOL) tablet 650 mg  650 mg Oral Q6H PRN Harriet Butte, NP      . alum & mag hydroxide-simeth (MAALOX/MYLANTA) 200-200-20 MG/5ML suspension 30 mL  30 mL Oral Q4H PRN Harriet Butte, NP      . citalopram (CELEXA) tablet 40 mg  40 mg Oral Daily Harriet Butte, NP   40 mg at 03/17/15 0859  . divalproex (DEPAKOTE ER) 24 hr tablet 500 mg  500 mg Oral QHS Harriet Butte, NP      . gabapentin (NEURONTIN) capsule 100 mg  100 mg Oral [id number] Saramma Eappen, MD      . hydrOXYzine (ATARAX/VISTARIL) tablet 50 mg  50 mg Oral TID PRN Harriet Butte, NP      . magnesium hydroxide (MILK OF MAGNESIA) suspension 30 mL  30 mL Oral Daily PRN Harriet Butte, NP      . nicotine (NICODERM CQ - dosed in mg/24 hours) patch 21 mg  21 mg Transdermal Daily Ursula Alert, MD   21 mg at 03/17/15 0859  . QUEtiapine (SEROQUEL) tablet 25 mg  25 mg Oral BID PRN Harriet Butte, NP      . risperiDONE (RISPERDAL) tablet 6 mg  6 mg Oral QHS Harriet Butte, NP       PTA Medications: Prescriptions prior to admission  Medication Sig Dispense Refill Last Dose  . gabapentin (NEURONTIN) 300 MG capsule Take 300 mg by mouth 3 (three) times daily as needed. Reported on 03/16/2015   03/16/2015 at Unknown time  . benztropine (COGENTIN) 0.5 MG tablet Take 0.5 mg by mouth 2 (two) times daily.   Unknown at Unknown time  .  citalopram (CELEXA) 40 MG tablet Take 40 mg by mouth daily. Reported on 03/16/2015   Unknown at Unknown time  . divalproex (DEPAKOTE ER) 250 MG 24 hr tablet Take 1 tablet (250  mg total) by mouth daily. (Patient not taking: Reported on 12/30/2014) 30 tablet 0 Not Taking at Unknown time  . divalproex (DEPAKOTE ER) 500 MG 24 hr tablet Take 500 mg by mouth at bedtime. Reported on 03/16/2015   Not Taking at Unknown time  . docusate sodium 100 MG CAPS Take 100 mg by mouth 2 (two) times daily. (Patient not taking: Reported on 08/01/2014) 60 capsule 0 Unknown at Unknown time  . pantoprazole (PROTONIX) 20 MG tablet Take 20 mg by mouth daily. Reported on 03/16/2015   More than a month at Unknown time  . QUEtiapine (SEROQUEL) 25 MG tablet Take 25 mg by mouth 2 (two) times daily as needed (depression; bipolar). Reported on 03/16/2015   Unknown at Unknown time  . risperiDONE (RISPERDAL) 3 MG tablet Take 6 mg by mouth at bedtime.   Unknown at Unknown time   Musculoskeletal: Strength & Muscle Tone: within normal limits Gait & Station: normal Patient leans: N/A  Psychiatric Specialty Exam: Physical Exam  Constitutional: She is oriented to person, place, and time. She appears well-developed and well-nourished.  HENT:  Head: Normocephalic.  Eyes: Pupils are equal, round, and reactive to light.  Neck: Normal range of motion.  Cardiovascular: Normal rate.   Respiratory: Effort normal.  GI: Soft.  Genitourinary:  Denies any issues in this area  Musculoskeletal: Normal range of motion.  Neurological: She is alert and oriented to person, place, and time.  Skin: Skin is warm and dry.  Psychiatric: Her speech is normal. Her mood appears anxious. Her affect is not angry, not blunt, not labile and not inappropriate. She is actively hallucinating. Thought content is paranoid. Cognition and memory are normal. She expresses impulsivity. She exhibits a depressed mood.    Review of Systems  Constitutional: Negative.    HENT: Negative.   Eyes: Negative.   Respiratory: Negative.   Cardiovascular: Negative.   Gastrointestinal: Negative.   Genitourinary: Negative.   Musculoskeletal: Negative.   Skin: Negative.   Neurological: Positive for seizures (Hx of).  Endo/Heme/Allergies: Negative.   Psychiatric/Behavioral: Positive for depression and hallucinations. Negative for suicidal ideas, memory loss and substance abuse. The patient is nervous/anxious and has insomnia.     Blood pressure 115/69, pulse 72, temperature 97.8 F (36.6 C), temperature source Oral, resp. rate 20, height '5\' 5"'  (1.651 m), weight 60.782 kg (134 lb), last menstrual period 03/17/2015.Body mass index is 22.3 kg/(m^2).  General Appearance: Casual  Eye Contact::  Fair  Speech:  Clear and Coherent  Volume:  Increased  Mood:  Anxious  Affect:  Restricted  Thought Process:  Circumstantial and Tangential  Orientation:  Full (Time, Place, and Person)  Thought Content:  Paranoid Ideation  Suicidal Thoughts:  No  Homicidal Thoughts:  No  Memory:  Immediate;   Fair Recent;   Fair Remote;   Fair  Judgement:  Impaired  Insight:  Fair  Psychomotor Activity:  Normal  Concentration:  Fair  Recall:  [employer] of Knowledge:Fair  Language: Fair  Akathisia:  No  Handed:  Right  AIMS (if indicated):     Assets:  Desire for Improvement  ADL's:  Intact  Cognition: Fairly normal  Sleep:  Number of Hours: 0.75   Treatment Plan/Recommendations: 1. Admit for crisis management and stabilization, estimated length of stay 3-5 days.  2. Medication management to reduce current symptoms to base line and improve the patient's overall level of functioning; Citalopram 20 mg for depression, Benztropine 0.5 mg for EPS, Depakote ER 500 mg  for mood stabilization/seizures, Hydroxyzine 25 mg for anxiety, Lorazepam 1 mg for anxiety, Seroquel 25 mg for agitation, Risperdal 6 mg for mood control   3. Treat health problems as indicated.  4. Develop treatment plan  to decrease risk of relapse upon discharge and the need for readmission.  5. Psycho-social education regarding relapse prevention and self care.  6. Health care follow up as needed for medical problems.  7. Review, reconcile, and reinstate any pertinent home medications for other health issues where appropriate. 8. Call for consults with hospitalist for any additional specialty patient care services as needed.  Observation Level/Precautions:  15 minute checks  Laboratory:  Per ED  Psychotherapy: Group sessions  Medications: Citalopram 20 mg for depression, Benztropine 0.5 mg for EPS, Depakote ER 500 mg for mood stabilization/seizures, Hydroxyzine 25 mg for anxiety, Lorazepam 1 mg for anxiety, Seroquel 25 mg for agitation, Risperdal 6 mg for mood control    Consultations: As needed    Discharge Concerns: Safety, mood stability  Estimated LOS: 5-7 days  Other:     I certify that inpatient services furnished can reasonably be expected to improve the patient's condition.   Encarnacion Slates, PMHNP, FNP-BC 1/27/201711:31 AM

## 2015-03-17 NOTE — Progress Notes (Signed)
D: Pt ambulatory in milieu with an unsteady gait. A & O X3. Pt denies SI, HI and AVh when assessed. Rated her pain 5/10 left leg. Verbally interactive with other and engaged in activities in milieu. Pt rated her depression 5/10, hopelessness 5/10 and anxiety 8/10 on self inventory sheet. Per pt "I'm okay right now, I guess". Pt encouraged to approach writer if /when she needs her pain medication and she was in agreement.  Stated her goal for today is to get started on her "right medications". Attended scheduled groups. Compliant with current medication regimen. Reported fair sleep, good appetite and concentration level. A: Verbal encouragement, support and availability offered to pt throughout this shift. All medications administered as per current MAR. EKG done and was tolerated well. Q 15 minutes checks maintained for safety as ordered without outburst or self injurious behavior to note at this time.  R: Pt receptive to care. Denies adverse drug reaction when assessed. POC continues.

## 2015-03-17 NOTE — [facility] Suicide Risk Assessment (Signed)
[facility] INPATIENT:  Family/Significant Other Suicide Prevention Education  Suicide Prevention Education:  Contact Attempts: Pam Drown (guardian/aunt) [phone] , (name of family member/significant other) has been identified by the patient as the family member/significant other with whom the patient will be residing, and identified as the person(s) who will aid the patient in the event of a mental health crisis.  With written consent from the patient, two attempts were made to provide suicide prevention education, prior to and/or following the patient's discharge.  We were unsuccessful in providing suicide prevention education.  A suicide education pamphlet was given to the patient to share with family/significant other.  Date and time of first attempt: 03/17/15 Called around 3pm. No answer.   Jonathon Jordan 03/17/2015, 4:25 PM

## 2015-03-17 NOTE — [facility] Group Notes (Signed)
[facility] LCSW Group Therapy   03/17/2015 1:56 PM  Type of Therapy: Group Therapy  Participation Level:  Active  Participation Quality:  Attentive  Affect:  Flat  Cognitive:  Oriented  Insight:  Limited  Engagement in Therapy:  Engaged  Modes of Intervention:  Discussion and Socialization  Summary of Progress/Problems: Chaplain was here to lead a group on themes of hope and/or courage.  "Hope is being able to walk another day. It's freedom." Pt went on to mention several family members and things that were seemingly unrelated. When asked to explain what she meant by being able to walk abother day, pt replied "Misery in [place]". Pt then walked out of group and came back towards the end of group. "I'm hopeful to get back to where I was and get some peace. I also have a roommate here full of hope."  Jonathon Jordan 03/17/2015 1:56 PM

## 2015-03-18 LAB — LIPID PANEL
CHOL/HDL RATIO: 3.4 ratio
CHOLESTEROL: 165 mg/dL (ref 0–200)
HDL: 48 mg/dL (ref >40)
LDL Cholesterol: 94 mg/dL (ref 0–99)
Triglycerides: 117 mg/dL (ref <150)
VLDL: 23 mg/dL (ref 0–40)

## 2015-03-18 LAB — TSH: TSH: 0.908 u[IU]/mL (ref 0.350–4.500)

## 2015-03-18 NOTE — Progress Notes (Signed)
Patient has been observed by writer up in the dayroom watching tv with minimal interaction with peers. She attended group and was compliant with scheduled medications. She c/o left leg pain and was medicated along with heat and cold packs. Support given and safety maintained on unit with 15 min checks.

## 2015-03-18 NOTE — [facility] Group Notes (Signed)
[facility] Group Notes: (Clinical Social Work)   03/18/2015      Type of Therapy:  Group Therapy   Participation Level:  Did Not Attend despite MHT prompting   Seda Kronberg Grossman-Orr, LCSW 03/18/2015, 12:22 PM     

## 2015-03-18 NOTE — Progress Notes (Signed)
Adult Psychoeducational Group Note  Date:  03/18/2015 Time:  9:03 PM  Group Topic/Focus:  Wrap-Up Group:   The focus of this group is to help patients review their daily goal of treatment and discuss progress on daily workbooks.  Participation Level:  Minimal  Participation Quality:  Appropriate  Affect:  Flat  Cognitive:  Confused  Insight: Lacking  Engagement in Group:  Limited  Modes of Intervention:  Socialization and Support  Additional Comments:  Patient attended and participated in group tonight. She reports that he day was a 5. She had mixed emotions, she felt weak today. She is hoping that tomorrow with be better.  Lita Mains [facility] 03/18/2015, 9:03 PM

## 2015-03-18 NOTE — Progress Notes (Addendum)
Patient ID: Sylvia [name], female   DOB: 1985/11/12, 30 y.o.   MRN: [id number]   D: Pt has been very flat and depressed today on the unit. Pt reported that she was having lots of withdrawal, however could not have withdrawal medication due to her blood pressure being low. Pt was in the bed most of the day. Due to patients blood pressure being low, this writer encouraged lot of fluids. Pt reported that her depression was a 7, her hopelessness was a 3, and her anxiety was a 0. Pt reported that her goal was to feel better. Pt reported that she would feel better if they stopped the Depakote, that Depakote lowers her blood pressure. Conrad NP made aware, no new orders noted. Pt reported being negative SI/HI, no AH/VH noted. A: 15 min checks continued for patient safety. R: Pt safety maintained.

## 2015-03-18 NOTE — Plan of Care (Signed)
Problem: Alteration in mood & ability to function due to Goal: STG-Patient will attend groups Outcome: Progressing Patient attended group tonight and participated.

## 2015-03-18 NOTE — Progress Notes (Signed)
Writer has observed patient up in the dayroom watching tv and interacting appropriately with peers. Writer spoke with her 1:1 and she reports that she plans to talk with Fayrene Fearing at [facility] about her ACT team. She reports that she feel that she is not receiving the right medications and they are just giving her medications that are not helping her. Writer informed her of her scheduled medications and she is refusing her depakote reporting that she can't take it because it makes her feel like she is dying.  She denies si/hi/a/v hallucinations. Support given and safety maintained on unit with 15 min checks.

## 2015-03-18 NOTE — Progress Notes (Signed)
[facility] MD Progress Note 03/18/2015  Sylvia [name]  MRN:  [id number] Subjective:  Pt seen and chart reviewed. Pt is alert/oriented x4, calm, cooperative, and appropriate to situation. Pt denies suicidal/homicidal ideation and psychosis and does not appear to be responding to internal stimuli. Pt is seen sleeping in the bed. She reports that she has felt overwhelmed lately with family dynamic strain. She is mildly intrusive toward the end of the conversation, stating "I told her I don't like Depakote."   Nursing staff report a BP of 70's/50's and HR 133 and pt is light-headed. Encouraged intake of 16-32oz fluid due to suspected severe dehydration. Pt's pulse lowered to 110 within 30 minutes and 90's/50's. Will continue to encourage fluids, caution pt about rapid position changes, and will hold librium until BP improves.   History of Present Illness: This is an admission assessment for this 30 year old Caucasian female. Admitted to [facility] with complaints of Schizoaffective disorder, bipolar-type, manic episodes. During this assessment, Sylvia [name] reports, "I went to the [facility] ED on the 25th of this month. I had a nervous break down on that day. What happened was, I saw my ex-boyfriend, he had my identification. I had previously given a friend of my mind my identification to hold for me because I don't want to be caught with two IDs. So, when I saw my ex with my ID, I got worried & became afraid, had a nervous break down. My ex is always stalking & harassing me. He told me that I will be locked-up in mental health institution. We had a baby together, but the baby is with my aunt who also is my payee. My ex is always threatening me. He has served time in prison. I live in a motel now. When I had the nervous break down, I went to a nearby EMS station & asked for help. They brought me to the ED. While at the ED, I went off on a cop who was talking very loud. That is why I was brought to this  hospital".  Objective: Sylvia [name] is seen, chart reviewed. She is alert, oriented x 3 & aware of situation. She does remember being in this hospital as teenager (30 years old), was at the adolescence unit at the time. During this assessment, Sylvia [name] appears to have a borderline intellectual functioning. Her stories seem circumstantial as well as tangential. She required frequent redirections. A review of the ED consult notes presented a different picture to what Sylvia [name] is currently presenting here. She admits having relapsed on cocaine recently. She reports hx of IV drug use. She reports medical hx of cerebral palsy, Hepatitis C & herpes. She also reports that she has hx of vaginal bleed, but none at this time. She currently denies any SIHI or AVH.   Principal Problem: Schizoaffective disorder, bipolar type (HCC) Diagnosis:   Patient Active Problem List   Diagnosis Date Noted  . 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 Cocaine use disorder, moderate, dependence (HCC) [F14.20] 03/17/2015    Priority: High  . Cannabis use disorder, mild, abuse [F12.10] 03/17/2015    Priority: High  . Alcohol use disorder, severe, dependence (HCC) [F10.20] 03/17/2015    Priority: High  . Schizoaffective disorder, bipolar type (HCC) [F25.0] 03/16/2015    Priority: High  . Seizure disorder (HCC) [G40.909] 09/17/2013  . Cellulitis and abscess of toe of right foot [L03.031, L02.611] 09/16/2013  . Hepatitis C [B19.20]   . CP (cerebral  palsy) (HCC) [G80.9]    Total Time spent with patient: 45 minutes  Past Psychiatric History: ETOH/Coc abuse, Schizoaffective  Past Medical History:  Past Medical History  Diagnosis Date  . CP (cerebral palsy) (HCC)   . Hepatitis C   . Herpes   . Stroke (HCC)   . Seizures (HCC)   . Schizoaffective schizophrenia (HCC) 09/17/2013    Per notes from [facility] as on 2014     Past Surgical History  Procedure Laterality Date  . Leg surgery Right     x7  .  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 Foot surgery      rt and left   Family History:  Family History  Problem Relation Age of Onset  . Bipolar disorder Mother   . Cancer Maternal Grandmother   . Diabetes Maternal Grandfather   . Hypertension Maternal Grandfather    Family Psychiatric  History: See H&P Social History:  History  Alcohol Use  . 1.8 oz/week  . 3 Cans of beer per week    Comment: Per patient 0.5 gallon to gallon when she can but since being in group home she can't do it as often      History  Drug Use  . Yes  . Special: "Crack" cocaine, Marijuana    Comment: Per patient used to use herion and crack but none in "a few years"     Social History   Social History  . Marital Status: Single    Spouse Name: N/A  . Number of Children: N/A  . Years of Education: N/A   Social History Main Topics  . Smoking status: Current Every Day Smoker -- 0.50 packs/day for 20 years    Types: Cigarettes  . Smokeless tobacco: Former Neurosurgeon  . Alcohol Use: 1.8 oz/week    3 Cans of beer per week     Comment: Per patient 0.5 gallon to gallon when she can but since being in group home she can't do it as often   . Drug Use: Yes    Special: "Crack" cocaine, Marijuana     Comment: Per patient used to use herion and crack but none in "a few years"   . Sexual Activity: Not Currently    Birth Control/ Protection: Implant   Other Topics Concern  . None   Social History Narrative   Additional Social History:    Pain Medications: none Prescriptions: see mar Over the Counter: see mar History of alcohol / drug use?: Yes Name of Substance 1: etoh 1 - Age of First Use: 10 1 - Frequency: daily 1 - Last Use / Amount: 2 days ago Name of Substance 2: herion 2 - Age of First Use: 19 2 - Last Use / Amount: 5 yr ago Name of Substance 3: cocaine 3  - Age of First Use: 17 3 - Last Use / Amount: 3 months ago              Sleep: Fair  Appetite:  Fair  Current Medications: Current Facility-Administered Medications  Medication Dose Route Frequency Provider Last Rate Last Dose  . acetaminophen (TYLENOL) tablet 650 mg  650 mg Oral Q6H PRN Worthy Flank, NP   650 mg at 03/17/15 1958  . alum & mag hydroxide-simeth (MAALOX/MYLANTA) 200-200-20 MG/5ML suspension 30 mL  30 mL Oral Q4H PRN Ijeoma E  Doran Stabler, NP      . benztropine (COGENTIN) tablet 0.5 mg  0.5 mg Oral Q2000 Sanjuana Kava, NP   0.5 mg at 03/17/15 1959  . chlordiazePOXIDE (LIBRIUM) capsule 25 mg  25 mg Oral Q6H PRN Jomarie Longs, MD      . chlordiazePOXIDE (LIBRIUM) capsule 25 mg  25 mg Oral QID Jomarie Longs, MD   25 mg at 03/17/15 2115   Followed by  . [START ON 03/19/2015] chlordiazePOXIDE (LIBRIUM) capsule 25 mg  25 mg Oral TID Jomarie Longs, MD       Followed by  . [START ON 03/20/2015] chlordiazePOXIDE (LIBRIUM) capsule 25 mg  25 mg Oral BH-qamhs Jomarie Longs, MD       Followed by  . [START ON 03/21/2015] chlordiazePOXIDE (LIBRIUM) capsule 25 mg  25 mg Oral Daily Saramma Eappen, MD      . citalopram (CELEXA) tablet 40 mg  40 mg Oral Daily Worthy Flank, NP   40 mg at 03/18/15 1153  . divalproex (DEPAKOTE ER) 24 hr tablet 500 mg  500 mg Oral QHS Worthy Flank, NP   500 mg at 03/17/15 2115  . gabapentin (NEURONTIN) capsule 100 mg  100 mg Oral [id number] Jomarie Longs, MD   100 mg at 03/18/15 1153  . hydrOXYzine (ATARAX/VISTARIL) tablet 25 mg  25 mg Oral Q6H PRN Jomarie Longs, MD      . loperamide (IMODIUM) capsule 2-4 mg  2-4 mg Oral PRN Jomarie Longs, MD      . magnesium hydroxide (MILK OF MAGNESIA) suspension 30 mL  30 mL Oral Daily PRN Worthy Flank, NP      . multivitamin with minerals tablet 1 tablet  1 tablet Oral Daily Jomarie Longs, MD   1 tablet at 03/17/15 1507  . nicotine (NICODERM CQ - dosed in mg/24 hours) patch 21 mg  21 mg Transdermal Daily  Jomarie Longs, MD   21 mg at 03/17/15 0859  . ondansetron (ZOFRAN-ODT) disintegrating tablet 4 mg  4 mg Oral Q6H PRN Saramma Eappen, MD      . polyethylene glycol (MIRALAX / GLYCOLAX) packet 17 g  17 g Oral Daily Sanjuana Kava, NP   17 g at 03/18/15 1153  . QUEtiapine (SEROQUEL) tablet 25 mg  25 mg Oral BID PRN Jomarie Longs, MD      . risperiDONE (RISPERDAL) tablet 6 mg  6 mg Oral QHS Worthy Flank, NP   6 mg at 03/17/15 2115  . thiamine (VITAMIN B-1) tablet 100 mg  100 mg Oral Daily Jomarie Longs, MD   100 mg at 03/17/15 1507    Lab Results:  Results for orders placed or performed during the hospital encounter of 03/17/15 (from the past 48 hour(s))  TSH     Status: None   Collection Time: 03/18/15  6:55 AM  Result Value Ref Range   TSH 0.908 0.350 - 4.500 uIU/mL    Comment: Performed at [facility]  Lipid panel     Status: None   Collection Time: 03/18/15  6:55 AM  Result Value Ref Range   Cholesterol 165 0 - 200 mg/dL   Triglycerides 161 <096 mg/dL   HDL 48 >04 mg/dL   Total CHOL/HDL Ratio 3.4 RATIO   VLDL 23 0 - 40 mg/dL   LDL Cholesterol 94 0 - 99 mg/dL    Comment:        Total Cholesterol/HDL:CHD Risk Coronary Heart Disease Risk Table  Men   Women  1/2 Average Risk   3.4   3.3  Average Risk       5.0   4.4  2 X Average Risk   9.6   7.1  3 X Average Risk  23.4   11.0        Use the calculated Patient Ratio above and the CHD Risk Table to determine the patient's CHD Risk.        ATP III CLASSIFICATION (LDL):  <100     mg/dL   Optimal  161-096  mg/dL   Near or Above                    Optimal  130-159  mg/dL   Borderline  045-409  mg/dL   High  >811     mg/dL   Very High Performed at [facility]     Physical Findings: AIMS: Facial and Oral Movements Muscles of Facial Expression: None, normal Lips and Perioral Area: None, normal Jaw: None, normal Tongue: None, normal,Extremity Movements Upper (arms, wrists,  hands, fingers): None, normal Lower (legs, knees, ankles, toes): None, normal, Trunk Movements Neck, shoulders, hips: None, normal, Overall Severity Severity of abnormal movements (highest score from questions above): None, normal Incapacitation due to abnormal movements: None, normal Patient's awareness of abnormal movements (rate only patient's report): No Awareness, Dental Status Current problems with teeth and/or dentures?: Yes ("rotting on the bottom") Does patient usually wear dentures?: No  CIWA:  CIWA-Ar Total: 0 COWS:     Musculoskeletal: Strength & Muscle Tone: within normal limits Gait & Station: normal Patient leans: N/A  Psychiatric Specialty Exam: Review of Systems  Psychiatric/Behavioral: Positive for depression and substance abuse. Negative for suicidal ideas and hallucinations. The patient is nervous/anxious and has insomnia.   All other systems reviewed and are negative.   Blood pressure 95/70, pulse 102, temperature 97.8 F (36.6 C), temperature source Oral, resp. rate 20, height  (1.651 m), weight 60.782 kg (134 lb), last menstrual period 03/17/2015.Body mass index is 22.3 kg/(m^2).  General Appearance: Casual and Fairly Groomed  Patent attorney::  Fair  Speech:  Clear and Coherent and Normal Rate  Volume:  Normal  Mood:  Anxious and Depressed  Affect:  Appropriate, Congruent and Depressed  Thought Process:  Circumstantial  Orientation:  Full (Time, Place, and Person)  Thought Content:  WDL  Suicidal Thoughts:  No  Homicidal Thoughts:  No  Memory:  Immediate;   Fair Recent;   Fair Remote;   Fair  Judgement:  Fair  Insight:  Fair  Psychomotor Activity:  Normal  Concentration:  Fair  Recall:  [employer] of Knowledge:Fair  Language: Fair  Akathisia:  No  Handed:    AIMS (if indicated):     Assets:  Communication Skills Desire for Improvement Resilience Social Support  ADL's:  Intact  Cognition: WNL  Sleep:  Number of Hours: 6.75   Treatment  Plan/Recommendations: 1. Admit for crisis management and stabilization, estimated length of stay 3-5 days.  2. Medication management to reduce current symptoms to base line and improve the patient's overall level of functioning;  -Cogentin 0.5mg  daily for EPS -Seroquel  bid prn anxiety -Risperidone  qhs for psychosis -Librium protocl with CIWA -Celexa  daily for MDD -Depakote  qhs -Vistaril  q6h prn anxiety  3. Treat health problems as indicated.  4. Develop treatment plan to decrease risk of relapse upon discharge and the need for readmission.  5. Psycho-social  education regarding relapse prevention and self care.  6. Health care follow up as needed for medical problems.  7. Review, reconcile, and reinstate any pertinent home medications for other health issues where appropriate. 8. Call for consults with hospitalist for any additional specialty patient care services as needed.  Beau Fanny, FNP-BC 03/18/2015, 9:35 AM I agree with assessment and plan Madie Reno A. Dub Mikes, M.D.

## 2015-03-19 NOTE — Progress Notes (Signed)
Pt accepted to bed 507-2 at [facility]. Rosey Bath, RN

## 2015-03-19 NOTE — [facility] Group Notes (Signed)
[facility] Group Notes:  (Clinical Social Work)  03/19/2015  [facility] Group Notes:  (Clinical Social Work)  03/19/2015  11:00AM-12:00PM  Summary of Progress/Problems:  The main focus of today's process group was to listen to a variety of genres of music and to identify that different types of music provoke different responses.  The patient then was able to identify personally what was soothing for them, as well as energizing.   The patient expressed understanding of concepts, as well as knowledge of how each type of music affected her and how this can be used at home as a wellness/recovery tool.  She stated that she did not mean to be insulting but we had not listened to music she would normally listen to, although CSW played something she requested and she commented on other songs that she said she enjoyed.    Type of Therapy:  Music Therapy   Participation Level:  Active  Participation Quality:  Attentive and Sharing  Affect:  Blunted  Cognitive:  Disorganized  Insight:  Improving  Engagement in Therapy:  Engaged  Modes of Intervention:   Activity, Exploration  Ambrose Mantle, LCSW 03/19/2015

## 2015-03-19 NOTE — Plan of Care (Signed)
Problem: Alteration in mood & ability to function due to Goal: LTG-Pt reports reduction in suicidal thoughts (Patient reports reduction in suicidal thoughts and is able to verbalize a safety plan for whenever patient is feeling suicidal)  Outcome: Progressing Patient currently denies suicidal ideations.     

## 2015-03-19 NOTE — Progress Notes (Signed)
Adult Psychoeducational Group Note  Date:  03/19/2015 Time:  8:59 PM  Group Topic/Focus:  Wrap-Up Group:   The focus of this group is to help patients review their daily goal of treatment and discuss progress on daily workbooks.  Participation Level:  Active  Participation Quality:  Appropriate  Affect:  Appropriate  Cognitive:  Appropriate  Insight: Appropriate  Engagement in Group:  Engaged  Modes of Intervention:  Discussion  Additional Comments:  The  patient expressed that good day and rates it a 7.The patient also said that her support system is her Aunt.  Octavio Manns 03/19/2015, 8:59 PM

## 2015-03-19 NOTE — Progress Notes (Signed)
Writer spoke with patient 1:1 who looks and reports that she feel better today. She is concerned about her discharge and her medications. She refused her depakote this evening but took her other scheduled medications. She reports that the depakote makes her feel really bad, She was cheerful tonight at the nursing station laughing and telling jokes. She denies si/hi/a/v hallucinations.

## 2015-03-19 NOTE — Progress Notes (Signed)
[facility] MD Progress Note 03/19/2015  Sylvia [name]  MRN:  [id number] Subjective: "I feel much better today but I don't want Depakote anymore"  Objective:  Pt seen and chart reviewed. Pt is alert/oriented x4, calm, cooperative, and appropriate to situation. Pt denies suicidal/homicidal ideation and psychosis and does not appear to be responding to internal stimuli. Pt reports that she wants her Neurontin increased and we discussed doing so if her BP is stable as it was very hypotensive yesterday. Pt does not want Depakote.   Principal Problem: Schizoaffective disorder, bipolar type (HCC) Diagnosis:   Patient Active Problem List   Diagnosis Date Noted  . 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 Cocaine use disorder, moderate, dependence (HCC) [F14.20] 03/17/2015    Priority: High  . Cannabis use disorder, mild, abuse [F12.10] 03/17/2015    Priority: High  . Alcohol use disorder, severe, dependence (HCC) [F10.20] 03/17/2015    Priority: High  . Schizoaffective disorder, bipolar type (HCC) [F25.0] 03/16/2015    Priority: High  . Seizure disorder (HCC) [G40.909] 09/17/2013  . Cellulitis and abscess of toe of right foot [L03.031, L02.611] 09/16/2013  . Hepatitis C [B19.20]   . CP (cerebral palsy) (HCC) [G80.9]    Total Time spent with patient: 15 minutes  Past Psychiatric History: ETOH/Coc abuse, Schizoaffective  Past Medical History:  Past Medical History  Diagnosis Date  . CP (cerebral palsy) (HCC)   . Hepatitis C   . Herpes   . Stroke (HCC)   . Seizures (HCC)   . Schizoaffective schizophrenia (HCC) 09/17/2013    Per notes from [facility] as on 2014     Past Surgical History  Procedure Laterality Date  . Leg surgery Right     x7  . 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 Foot surgery      rt and left   Family  History:  Family History  Problem Relation Age of Onset  . Bipolar disorder Mother   . Cancer Maternal Grandmother   . Diabetes Maternal Grandfather   . Hypertension Maternal Grandfather    Family Psychiatric  History: See H&P Social History:  History  Alcohol Use  . 1.8 oz/week  . 3 Cans of beer per week    Comment: Per patient 0.5 gallon to gallon when she can but since being in group home she can't do it as often      History  Drug Use  . Yes  . Special: "Crack" cocaine, Marijuana    Comment: Per patient used to use herion and crack but none in "a few years"     Social History   Social History  . Marital Status: Single    Spouse Name: N/A  . Number of Children: N/A  . Years of Education: N/A   Social History Main Topics  . Smoking status: Current Every Day Smoker -- 0.50 packs/day for 20 years    Types: Cigarettes  . Smokeless tobacco: Former Neurosurgeon  . Alcohol Use: 1.8 oz/week    3 Cans of beer per week     Comment: Per patient 0.5 gallon to gallon when she can but since being in group home she can't do it as often   . Drug Use: Yes    Special: "Crack" cocaine, Marijuana     Comment: Per patient used to use  herion and crack but none in "a few years"   . Sexual Activity: Not Currently    Birth Control/ Protection: Implant   Other Topics Concern  . None   Social History Narrative   Additional Social History:    Pain Medications: none Prescriptions: see mar Over the Counter: see mar History of alcohol / drug use?: Yes Name of Substance 1: etoh 1 - Age of First Use: 10 1 - Frequency: daily 1 - Last Use / Amount: 2 days ago Name of Substance 2: herion 2 - Age of First Use: 19 2 - Last Use / Amount: 5 yr ago Name of Substance 3: cocaine 3 - Age of First Use: 17 3 - Last Use / Amount: 3 months ago              Sleep: Fair  Appetite:  Fair  Current Medications: Current Facility-Administered Medications  Medication Dose Route Frequency Provider  Last Rate Last Dose  . acetaminophen (TYLENOL) tablet 650 mg  650 mg Oral Q6H PRN Worthy Flank, NP   650 mg at 03/17/15 1958  . alum & mag hydroxide-simeth (MAALOX/MYLANTA) 200-200-20 MG/5ML suspension 30 mL  30 mL Oral Q4H PRN Worthy Flank, NP      . benztropine (COGENTIN) tablet 0.5 mg  0.5 mg Oral Q2000 Sanjuana Kava, NP   0.5 mg at 03/18/15 1939  . chlordiazePOXIDE (LIBRIUM) capsule 25 mg  25 mg Oral Q6H PRN Jomarie Longs, MD      . chlordiazePOXIDE (LIBRIUM) capsule 25 mg  25 mg Oral TID Jomarie Longs, MD   25 mg at 03/19/15 0900   Followed by  . [START ON 03/20/2015] chlordiazePOXIDE (LIBRIUM) capsule 25 mg  25 mg Oral BH-qamhs Jomarie Longs, MD       Followed by  . [START ON 03/21/2015] chlordiazePOXIDE (LIBRIUM) capsule 25 mg  25 mg Oral Daily Saramma Eappen, MD      . citalopram (CELEXA) tablet 40 mg  40 mg Oral Daily Worthy Flank, NP   40 mg at 03/19/15 0727  . divalproex (DEPAKOTE ER) 24 hr tablet 500 mg  500 mg Oral QHS Worthy Flank, NP   500 mg at 03/17/15 2115  . gabapentin (NEURONTIN) capsule 100 mg  100 mg Oral [id number] Saramma Eappen, MD   100 mg at 03/19/15 1416  . hydrOXYzine (ATARAX/VISTARIL) tablet 25 mg  25 mg Oral Q6H PRN Jomarie Longs, MD   25 mg at 03/19/15 0727  . loperamide (IMODIUM) capsule 2-4 mg  2-4 mg Oral PRN Jomarie Longs, MD      . magnesium hydroxide (MILK OF MAGNESIA) suspension 30 mL  30 mL Oral Daily PRN Worthy Flank, NP      . multivitamin with minerals tablet 1 tablet  1 tablet Oral Daily Jomarie Longs, MD   1 tablet at 03/19/15 0727  . nicotine (NICODERM CQ - dosed in mg/24 hours) patch 21 mg  21 mg Transdermal Daily Jomarie Longs, MD   21 mg at 03/19/15 0727  . ondansetron (ZOFRAN-ODT) disintegrating tablet 4 mg  4 mg Oral Q6H PRN Saramma Eappen, MD      . polyethylene glycol (MIRALAX / GLYCOLAX) packet 17 g  17 g Oral Daily Sanjuana Kava, NP   17 g at 03/19/15 0744  . QUEtiapine (SEROQUEL) tablet 25 mg  25 mg Oral BID PRN Jomarie Longs, MD      . risperiDONE (RISPERDAL) tablet 6 mg  6 mg Oral QHS  Worthy Flank, NP   6 mg at 03/18/15 2103  . thiamine (VITAMIN B-1) tablet 100 mg  100 mg Oral Daily Jomarie Longs, MD   100 mg at 03/19/15 0454    Lab Results:  Results for orders placed or performed during the hospital encounter of 03/17/15 (from the past 48 hour(s))  TSH     Status: None   Collection Time: 03/18/15  6:55 AM  Result Value Ref Range   TSH 0.908 0.350 - 4.500 uIU/mL    Comment: Performed at [facility]  Lipid panel     Status: None   Collection Time: 03/18/15  6:55 AM  Result Value Ref Range   Cholesterol 165 0 - 200 mg/dL   Triglycerides 098 <119 mg/dL   HDL 48 >14 mg/dL   Total CHOL/HDL Ratio 3.4 RATIO   VLDL 23 0 - 40 mg/dL   LDL Cholesterol 94 0 - 99 mg/dL    Comment:        Total Cholesterol/HDL:CHD Risk Coronary Heart Disease Risk Table                     Men   Women  1/2 Average Risk   3.4   3.3  Average Risk       5.0   4.4  2 X Average Risk   9.6   7.1  3 X Average Risk  23.4   11.0        Use the calculated Patient Ratio above and the CHD Risk Table to determine the patient's CHD Risk.        ATP III CLASSIFICATION (LDL):  <100     mg/dL   Optimal  782-956  mg/dL   Near or Above                    Optimal  130-159  mg/dL   Borderline  213-086  mg/dL   High  >578     mg/dL   Very High Performed at [facility]     Physical Findings: AIMS: Facial and Oral Movements Muscles of Facial Expression: None, normal Lips and Perioral Area: None, normal Jaw: None, normal Tongue: None, normal,Extremity Movements Upper (arms, wrists, hands, fingers): None, normal Lower (legs, knees, ankles, toes): None, normal, Trunk Movements Neck, shoulders, hips: None, normal, Overall Severity Severity of abnormal movements (highest score from questions above): None, normal Incapacitation due to abnormal movements: None, normal Patient's awareness of abnormal  movements (rate only patient's report): No Awareness, Dental Status Current problems with teeth and/or dentures?: Yes ("rotting on the bottom") Does patient usually wear dentures?: No  CIWA:  CIWA-Ar Total: 0 COWS:     Musculoskeletal: Strength & Muscle Tone: within normal limits Gait & Station: normal Patient leans: N/A  Psychiatric Specialty Exam: Review of Systems  Psychiatric/Behavioral: Positive for depression and substance abuse. Negative for suicidal ideas and hallucinations. The patient is nervous/anxious and has insomnia.   All other systems reviewed and are negative.   Blood pressure 94/52, pulse 110, temperature 97.6 F (36.4 C), temperature source Oral, resp. rate 18, height  (1.651 m), weight 60.782 kg (134 lb), last menstrual period 03/17/2015.Body mass index is 22.3 kg/(m^2).  General Appearance: Casual and Fairly Groomed  Patent attorney::  Fair  Speech:  Clear and Coherent and Normal Rate  Volume:  Normal  Mood:  Anxious and Depressed  Affect:  Appropriate, Congruent and Depressed  Thought Process:  Circumstantial  Orientation:  Full (Time, Place, and Person)  Thought Content:  WDL  Suicidal Thoughts:  No  Homicidal Thoughts:  No  Memory:  Immediate;   Fair Recent;   Fair Remote;   Fair  Judgement:  Fair  Insight:  Fair  Psychomotor Activity:  Normal  Concentration:  Fair  Recall:  [employer] of Knowledge:Fair  Language: Fair  Akathisia:  No  Handed:    AIMS (if indicated):     Assets:  Communication Skills Desire for Improvement Resilience Social Support  ADL's:  Intact  Cognition: WNL  Sleep:  Number of Hours: 6.75   Today on 03/19/2015, pt is improving in regard to her mood instability, noting decreased anxiety/rumination. However, pt is slightly agitated about what other patients are doing on the unit.    Treatment Plan/Recommendations: 1. Admit for crisis management and stabilization, estimated length of stay 3-5 days.  2. Medication  management to reduce current symptoms to base line and improve the patient's overall level of functioning;  -Cogentin 0.5mg  daily for EPS -Seroquel  bid prn anxiety -Risperidone  qhs for psychosis -Librium protocl with CIWA -Celexa  daily for MDD -Increase Gabapentin to  tid if BP is stable. -Discontinue Depakote  qhs; pt is refusing -Vistaril  q6h prn anxiety  3. Treat health problems as indicated.  4. Develop treatment plan to decrease risk of relapse upon discharge and the need for readmission.  5. Psycho-social education regarding relapse prevention and self care.  6. Health care follow up as needed for medical problems.  7. Review, reconcile, and reinstate any pertinent home medications for other health issues where appropriate. 8. Call for consults with hospitalist for any additional specialty patient care services as needed.  Beau Fanny, FNP-BC 03/19/2015, 2:00 PM I agree with assessment and plan Madie Reno A. Dub Mikes, M.D.

## 2015-03-19 NOTE — Progress Notes (Signed)
Patient ID: Sylvia [name], female   DOB: Jun 08, 1985, 30 y.o.   MRN: [id number]   D: Pt has been very flat and depressed today on the unit. Pt reported that she did feel much better, and she felt that she was better because she was not getting the Depakote. Conrad NP made aware of patients concerns, he reported that he would look at medication. Pt reported that her depression was a 5, her hopelessness was a 2, and her anxiety was a 10. Pt reported that her goal was to feel better and get rid of leg pain and to start working on her discharge planning. Pt reported that she would feel better if they stopped the Depakote, that Depakote lowers her blood pressure. Conrad NP made aware, no new orders noted. Pt reported being negative SI/HI, no AH/VH noted. A: 15 min checks continued for patient safety. R: Pt safety maintained.

## 2015-03-20 LAB — URINALYSIS W MICROSCOPIC (NOT AT ARMC)
BILIRUBIN URINE: NEGATIVE
Glucose, UA: NEGATIVE mg/dL
HGB URINE DIPSTICK: NEGATIVE
KETONES UR: NEGATIVE mg/dL
LEUKOCYTES UA: NEGATIVE
NITRITE: NEGATIVE
PROTEIN: NEGATIVE mg/dL
RBC / HPF: NONE SEEN RBC/hpf (ref 0–5)
SPECIFIC GRAVITY, URINE: 1.005 (ref 1.005–1.030)
WBC UA: NONE SEEN WBC/hpf (ref 0–5)
pH: 6.5 (ref 5.0–8.0)

## 2015-03-20 LAB — HEMOGLOBIN A1C
HEMOGLOBIN A1C: 5.3 % (ref 4.8–5.6)
MEAN PLASMA GLUCOSE: 105 mg/dL

## 2015-03-20 LAB — PROLACTIN: Prolactin: 68.5 ng/mL — ABNORMAL HIGH (ref 4.8–23.3)

## 2015-03-20 MED ORDER — NICOTINE POLACRILEX 2 MG MT GUM
2.0000 mg | CHEWING_GUM | OROMUCOSAL | Status: DC | PRN
  Administered 2015-03-20 – 2015-03-24 (×17): 2 mg via ORAL
  Filled 2015-03-20 (×5): qty 1

## 2015-03-20 MED ORDER — IBUPROFEN 600 MG PO TABS
600.0000 mg | ORAL_TABLET | Freq: Four times a day (QID) | ORAL | Status: DC
  Administered 2015-03-20 – 2015-03-21 (×4): 600 mg via ORAL
  Filled 2015-03-20 (×9): qty 1

## 2015-03-20 MED ORDER — HYDROXYZINE HCL 25 MG PO TABS
25.0000 mg | ORAL_TABLET | Freq: Four times a day (QID) | ORAL | Status: DC | PRN
  Administered 2015-03-20: 25 mg via ORAL
  Filled 2015-03-20: qty 1

## 2015-03-20 MED ORDER — GABAPENTIN 300 MG PO CAPS
300.0000 mg | ORAL_CAPSULE | ORAL | Status: DC
  Administered 2015-03-20 – 2015-03-22 (×7): 300 mg via ORAL
  Filled 2015-03-20 (×12): qty 1

## 2015-03-20 NOTE — [facility] Group Notes (Signed)
[facility] LCSW Group Therapy  03/20/2015 4:46 PM   Type of Therapy:  Group Therapy  Participation Level:  Active  Participation Quality:  Attentive  Affect:  Appropriate  Cognitive:  Appropriate  Insight:  Improving  Engagement in Therapy:  Engaged  Modes of Intervention:  Clarification, Education, Exploration and Socialization  Summary of Progress/Problems: Today's group focused on relapse prevention.  We defined the term, and then brainstormed on ways to prevent relapse. Present for about half of group.  Engaged while there.  Talked about her struggles with 7 surgeries and the need to have to learn to walk again 4 times after surgeries.  While she downplayed it, others capitalized on her resilience and willing ness to push through no matter what.  She gave others positive feedback and encouragement.  Daryel Gerald B 03/20/2015 , 4:46 PM

## 2015-03-20 NOTE — Progress Notes (Signed)
Adult Psychoeducational Group Note  Date:  03/20/2015 Time:  9:39 PM  Group Topic/Focus:  Wrap-Up Group:   The focus of this group is to help patients review their daily goal of treatment and discuss progress on daily workbooks.  Participation Level:  Did Not Attend  Participation Quality:  Did not attend  Affect:  Did not attend  Cognitive:  Did not attend  Insight: None  Engagement in Group:  Did not attend  Modes of Intervention:  Did not attend  Additional Comments:  Patient did not attend wrap up group tonight.   Chalet Kerwin L Keyry Iracheta 03/20/2015, 9:39 PM

## 2015-03-20 NOTE — Progress Notes (Signed)
Sylvia [name] has been visible on the unit.  She is hyper verbal and hyperactive at times.  She stayed in her room much of the morning and didn't get up to take her medications until later.  She denies any SI/HI or A/V hallucinations.    She complained of constipation and milk of magnesia given prn with good relief.  She had to leave gym group early due to having loose stools.  She reports having leg pain and ibuprofen given with good relief.  She did complete her self inventory and reports that her depression is 3/10, hopelessness 2/10 and anxiety is 7/10.  She reports that her goal for today is "medication right and work on discharge plan" and she will accomplish this goal by "trying."  She had visit by Sylvia [name], worker at [employer], visit and discussed D/C plan.  Sylvia [name] would like to have [place] fax medication list upon discharge so they can notify their pharmacy about the changes.  Note left for Child psychotherapist.  Encouraged continued participation in group and unit activities.  Q 15 minute checks maintained for safety.  We will continue to monitor the progress towards her goals.  Sylvia [name] remains safe on the unit.

## 2015-03-20 NOTE — Progress Notes (Signed)
[facility] MD Progress Note 03/20/2015  Sylvia [name]  MRN:  [id number] Subjective: Pt today states that " I have pain in my left leg , its my right side that hurts usually. I can take motrin. But I do not want to take Depakote, seroquel, keppra, dilantin - none of that , I had bad reaction to depakote and I ended up in the ICU last time. I will take gabapentin , that's all I need for seizures. 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 Patient seen and chart reviewed.Discussed patient with treatment team. I have also reviewed previous notes in EHR per [facility] NP. Pt today initially appeared to withdrawn , anxious , later on was able to participate in evaluation. Pt reports that she continues to have aches and pains all over her body. Pt reports sleep as improved. Pt denies any AH/VH today. Pt reports concerns about being restarted on Depakote, seroquel - reports hx of ADRs to both. Discussed other anticonvulsant medications with patient - pt declines - states Keppra, dilantin, tegretol, lamictal - she does not tolerate any of them. Pt wants to be continues on Gabapentin only.CIWA/VS reviewed. Pt with BP improved than yesterday, however T is 100.2 F . Nursing to monitor this. She denies any sx at this time. Will provide symptomatic treatment at this time.     Principal Problem: Schizoaffective disorder, bipolar type (HCC) Diagnosis:   Patient Active Problem List   Diagnosis Date Noted  . 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 Cocaine use disorder, moderate, dependence (HCC) [F14.20] 03/17/2015  . Cannabis use disorder, mild, abuse [F12.10] 03/17/2015  . Alcohol use disorder, severe, dependence (HCC) [F10.20] 03/17/2015  . Schizoaffective disorder, bipolar type (HCC) [F25.0] 03/16/2015  .  Seizure disorder (HCC) [G40.909] 09/17/2013  . Cellulitis and abscess of toe of right foot [L03.031, L02.611] 09/16/2013  . Hepatitis C [B19.20]   . CP (cerebral palsy) (HCC) [G80.9]    Total Time spent with patient: 30 minutes  Past Psychiatric History: Hx of schizoaffective do, alcohol abuse severe, cocaine abuse, cannabis abuse , has ACTT - who reports pt also with Cluster B traits. Has had several hospitalizations in the past.  Past Medical History:  Past Medical History  Diagnosis Date  . CP (cerebral palsy) (HCC)   . Hepatitis C   . Herpes   . Stroke (HCC)   . Seizures (HCC)   . Schizoaffective schizophrenia (HCC) 09/17/2013    Per notes from [facility] as on 2014     Past Surgical History  Procedure Laterality Date  . Leg surgery Right     x7  . 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 Foot surgery      rt and left   Family History:  Family History  Problem Relation Age of Onset  . Bipolar disorder Mother   . Cancer Maternal Grandmother   . Diabetes Maternal Grandfather   . Hypertension Maternal Grandfather    Family Psychiatric  History: See H&P Social History: Is single , currently lives at Extended stay motel , has a payee , and an ACTT. History  Alcohol Use  . 1.8 oz/week  . 3 Cans of beer  per week    Comment: Per patient 0.5 gallon to gallon when she can but since being in group home she can't do it as often      History  Drug Use  . Yes  . Special: "Crack" cocaine, Marijuana    Comment: Per patient used to use herion and crack but none in "a few years"     Social History   Social History  . Marital Status: Single    Spouse Name: N/A  . Number of Children: N/A  . Years of Education: N/A   Social History Main Topics  . Smoking status: Current Every Day Smoker -- 0.50 packs/day for 20 years    Types: Cigarettes  .  Smokeless tobacco: Former Neurosurgeon  . Alcohol Use: 1.8 oz/week    3 Cans of beer per week     Comment: Per patient 0.5 gallon to gallon when she can but since being in group home she can't do it as often   . Drug Use: Yes    Special: "Crack" cocaine, Marijuana     Comment: Per patient used to use herion and crack but none in "a few years"   . Sexual Activity: Not Currently    Birth Control/ Protection: Implant   Other Topics Concern  . None   Social History Narrative   Additional Social History:    Pain Medications: none Prescriptions: see mar Over the Counter: see mar History of alcohol / drug use?: Yes Name of Substance 1: etoh 1 - Age of First Use: 10 1 - Frequency: daily 1 - Last Use / Amount: 2 days ago Name of Substance 2: herion 2 - Age of First Use: 19 2 - Last Use / Amount: 5 yr ago Name of Substance 3: cocaine 3 - Age of First Use: 17 3 - Last Use / Amount: 3 months ago              Sleep: Fair  Appetite:  Fair  Current Medications: Current Facility-Administered Medications  Medication Dose Route Frequency Provider Last Rate Last Dose  . alum & mag hydroxide-simeth (MAALOX/MYLANTA) 200-200-20 MG/5ML suspension 30 mL  30 mL Oral Q4H PRN Worthy Flank, NP      . benztropine (COGENTIN) tablet 0.5 mg  0.5 mg Oral Q2000 Sanjuana Kava, NP   0.5 mg at 03/19/15 2123  . chlordiazePOXIDE (LIBRIUM) capsule 25 mg  25 mg Oral Q6H PRN Jomarie Longs, MD      . chlordiazePOXIDE (LIBRIUM) capsule 25 mg  25 mg Oral BH-qamhs Jayvion Stefanski, MD   25 mg at 03/20/15 0949   Followed by  . [START ON 03/21/2015] chlordiazePOXIDE (LIBRIUM) capsule 25 mg  25 mg Oral Daily Manuelito Poage, MD      . citalopram (CELEXA) tablet 40 mg  40 mg Oral Daily Worthy Flank, NP   40 mg at 03/20/15 0948  . gabapentin (NEURONTIN) capsule 300 mg  300 mg Oral [id number] Muaad Boehning, MD      . hydrOXYzine (ATARAX/VISTARIL) tablet 25 mg  25 mg Oral Q6H PRN Jomarie Longs, MD   25 mg at 03/19/15  0727  . ibuprofen (ADVIL,MOTRIN) tablet 600 mg  600 mg Oral QID Shykeem Resurreccion, MD      . loperamide (IMODIUM) capsule 2-4 mg  2-4 mg Oral PRN Jacia Sickman, MD      . magnesium hydroxide (MILK OF MAGNESIA) suspension 30 mL  30 mL Oral Daily PRN Worthy Flank, NP      .  multivitamin with minerals tablet 1 tablet  1 tablet Oral Daily Jomarie Longs, MD   1 tablet at 03/20/15 0949  . nicotine polacrilex (NICORETTE) gum 2 mg  2 mg Oral PRN Jashua Knaak, MD      . ondansetron (ZOFRAN-ODT) disintegrating tablet 4 mg  4 mg Oral Q6H PRN Uri Turnbough, MD      . polyethylene glycol (MIRALAX / GLYCOLAX) packet 17 g  17 g Oral Daily Sanjuana Kava, NP   17 g at 03/20/15 0950  . risperiDONE (RISPERDAL) tablet 6 mg  6 mg Oral QHS Worthy Flank, NP   6 mg at 03/19/15 2123  . thiamine (VITAMIN B-1) tablet 100 mg  100 mg Oral Daily Jomarie Longs, MD   100 mg at 03/20/15 0950    Lab Results:  No results found for this or any previous visit (from the past 48 hour(s)).  Physical Findings: AIMS: Facial and Oral Movements Muscles of Facial Expression: None, normal Lips and Perioral Area: None, normal Jaw: None, normal Tongue: None, normal,Extremity Movements Upper (arms, wrists, hands, fingers): None, normal Lower (legs, knees, ankles, toes): None, normal, Trunk Movements Neck, shoulders, hips: None, normal, Overall Severity Severity of abnormal movements (highest score from questions above): None, normal Incapacitation due to abnormal movements: None, normal Patient's awareness of abnormal movements (rate only patient's report): No Awareness, Dental Status Current problems with teeth and/or dentures?: Yes ("rotting on the bottom") Does patient usually wear dentures?: No  CIWA:  CIWA-Ar Total: 0 COWS:     Musculoskeletal: Strength & Muscle Tone: within normal limits Gait & Station: normal Patient leans: N/A  Psychiatric Specialty Exam: Review of Systems  Psychiatric/Behavioral: Positive  for depression and substance abuse. Negative for suicidal ideas and hallucinations. The patient is nervous/anxious.   All other systems reviewed and are negative.   Blood pressure 101/66, pulse 79, temperature 100.2 F (37.9 C), temperature source Oral, resp. rate 18, height  (1.651 m), weight 60.782 kg (134 lb), last menstrual period 03/17/2015.Body mass index is 22.3 kg/(m^2).  General Appearance: Disheveled  Eye Solicitor::  Fair  Speech:  Normal Rate  Volume:  Normal  Mood:  Anxious and Depressed  Affect:  Anxious  Thought Process:  Circumstantial  Orientation:  Full (Time, Place, and Person)  Thought Content:  Paranoid Ideation and Rumination  Suicidal Thoughts:  No  Homicidal Thoughts:  No  Memory:  Immediate;   Fair Recent;   Fair Remote;   Fair  Judgement:  Fair  Insight:  Fair  Psychomotor Activity:  Restlessness  Concentration:  Fair  Recall:  [employer] of Knowledge:Fair  Language: Fair  Akathisia:  No  Handed:    AIMS (if indicated):     Assets:  Communication Skills Desire for Improvement Resilience Social Support  ADL's:  Intact  Cognition: WNL  Sleep:  Number of Hours: 6.75   Treatment Plan Summary:Patient is a 58 y old CF, with hx of schizoaffective do as well as polysubstance abuse , CP,seizure do, borderline intellectual functioning, who presents with labile mood and psychosis.. Pt will need inpatient stabilization Daily contact with patient to assess and evaluate symptoms and progress in treatment and Medication management   PLAN OF CARE:   Reviewed past medical records,treatment plan.  Will increase Gabapentin to 300 mg po tid for anxiety/pain/mood sx/seizure do. Continue CIWA/Librium prn for severe alcohol abuse. Will continue Risperidone 6 mg po qhs for psychosis. Will continue Cogentin 0.5 mg po qhs for EPS. Will make available  PRN medications as per agitation protocol. Add Motrin 600 mg po qid for pain/fever.  Will get UA/Ulcx -  reassess. Will continue to monitor vitals ,medication compliance and treatment side effects while patient is here.  Will monitor for medical issues as well as call consult as needed.  Reviewed labs TSH - wnl, PL - slightly elevates - pt however is allergic to a lot of medications - will discuss to follow on an outpt basis - will not change Risperidone. Hba1c- wnl. CSW will start working on disposition.  Patient to participate in therapeutic milieu .     Elmond Poehlman, MD 03/20/2015, 10:02 AM

## 2015-03-21 MED ORDER — HYDROXYZINE HCL 50 MG PO TABS
50.0000 mg | ORAL_TABLET | Freq: Three times a day (TID) | ORAL | Status: DC | PRN
  Administered 2015-03-21 – 2015-03-24 (×7): 50 mg via ORAL
  Filled 2015-03-21 (×7): qty 1

## 2015-03-21 MED ORDER — IBUPROFEN 600 MG PO TABS
600.0000 mg | ORAL_TABLET | Freq: Four times a day (QID) | ORAL | Status: DC | PRN
  Administered 2015-03-21 – 2015-03-24 (×4): 600 mg via ORAL
  Filled 2015-03-21 (×3): qty 1

## 2015-03-21 NOTE — Progress Notes (Signed)
Recreation Therapy Notes  Animal-Assisted Activity (AAA) Program Checklist/Progress Notes Patient Eligibility Criteria Checklist & Daily Group note for Rec Tx Intervention  Date: 01.31.2017 Time: 2:45pm Location: [address] [employer]    AAA/T Program Assumption of Risk Form signed by Patient/ or Parent Legal Guardian yes  Patient is free of allergies or sever asthma yes  Patient reports no fear of animals yes  Patient reports no history of cruelty to animals yes  Patient understands his/her participation is voluntary yes  Patient washes hands before animal contact yes  Patient washes hands after animal contact yes  Behavioral Response: Appropriate   Education: Hand Washing, Appropriate Animal Interaction   Education Outcome: Acknowledges education.   Clinical Observations/Feedback: LRT discussed patient appropriateness for pet therapy session, MD & LRT agree patient is appropriate. Patient appropriate during session, interacting with therapy dog and peers well. Discussion between peers touched on using micro-chips in dogs. Patient demonstrated some paranoia, stating detriments of using "chips to track people and dogs." Patient did not appear agitated by peers who were defending use of micro-chips. Patient remained in session for approximately 10 minutes, at which time she returned to [address].   [name], LRT/CTRS  [name] 03/21/2015 3:19 PM

## 2015-03-21 NOTE — Progress Notes (Signed)
[facility] MD Progress Note 03/21/2015  Sylvia [name]  MRN:  [id number] Subjective: Pt today states that " I am anxious , I usually take Vistaril 100 mg several times a day."    Objective:Patient is a 84 y old CF, with hx of schizoaffective do as well as polysubstance abuse , CP,seizure do, borderline intellectual functioning, who presents with labile mood and psychosis.  Patient seen and chart reviewed.Discussed patient with treatment team. I have also reviewed previous notes in EHR per [facility] NP. Pt today initially appeared to be in bed sleeping . Pt woke up after several attempts and was able to participate in evaluation. Pt reported feeling very anxious - reports that she takes a higher dose of Vistaril usually to keep herself stable. Discussed with pt the ADRs of being on higher doses of Vistaril and the cardiac risks associated with being on Vistaril >100 mg for a long term basis. Pt voiced understanding. Per staff - pt continues to be anxious , paranoid , withdrawn . Denied any disruptive issues on the unit.    Principal Problem: Schizoaffective disorder, bipolar type (HCC) Diagnosis:   Patient Active Problem List   Diagnosis Date Noted  . 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 Cocaine use disorder, moderate, dependence (HCC) [F14.20] 03/17/2015  . Cannabis use disorder, mild, abuse [F12.10] 03/17/2015  . Alcohol use disorder, severe, dependence (HCC) [F10.20] 03/17/2015  . Schizoaffective disorder, bipolar type (HCC) [F25.0] 03/16/2015  . Seizure disorder (HCC) [G40.909] 09/17/2013  . Cellulitis and abscess of toe of right foot [L03.031, L02.611] 09/16/2013  . Hepatitis C [B19.20]   . CP (cerebral palsy) (HCC) [G80.9]    Total Time spent with patient: 30 minutes  Past Psychiatric History: Hx of schizoaffective do, alcohol abuse severe, cocaine abuse, cannabis abuse , has ACTT - who reports pt also with Cluster B traits. Has had several hospitalizations in the  past.  Past Medical History:  Past Medical History  Diagnosis Date  . CP (cerebral palsy) (HCC)   . Hepatitis C   . Herpes   . Stroke (HCC)   . Seizures (HCC)   . Schizoaffective schizophrenia (HCC) 09/17/2013    Per notes from [facility] as on 2014     Past Surgical History  Procedure Laterality Date  . Leg surgery Right     x7  . 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 Foot surgery      rt and left   Family History:  Family History  Problem Relation Age of Onset  . Bipolar disorder Mother   . Cancer Maternal Grandmother   . Diabetes Maternal Grandfather   . Hypertension Maternal Grandfather    Family Psychiatric  History: See H&P Social History: Is single , currently lives at Extended stay motel , has a payee , and an ACTT. History  Alcohol Use  . 1.8 oz/week  . 3 Cans of beer per week    Comment: Per patient 0.5 gallon to gallon when she can but since being in group home she can't do it as often      History  Drug Use  . Yes  . Special: "Crack" cocaine, Marijuana    Comment: Per patient used to use herion and crack but none in "a few years"     Social History   Social History  . Marital Status: Single  Spouse Name: N/A  . Number of Children: N/A  . Years of Education: N/A   Social History Main Topics  . Smoking status: Current Every Day Smoker -- 0.50 packs/day for 20 years    Types: Cigarettes  . Smokeless tobacco: Former Neurosurgeon  . Alcohol Use: 1.8 oz/week    3 Cans of beer per week     Comment: Per patient 0.5 gallon to gallon when she can but since being in group home she can't do it as often   . Drug Use: Yes    Special: "Crack" cocaine, Marijuana     Comment: Per patient used to use herion and crack but none in "a few years"   . Sexual Activity: Not Currently    Birth Control/ Protection: Implant   Other Topics  Concern  . None   Social History Narrative   Additional Social History:    Pain Medications: none Prescriptions: see mar Over the Counter: see mar History of alcohol / drug use?: Yes Name of Substance 1: etoh 1 - Age of First Use: 10 1 - Frequency: daily 1 - Last Use / Amount: 2 days ago Name of Substance 2: herion 2 - Age of First Use: 19 2 - Last Use / Amount: 5 yr ago Name of Substance 3: cocaine 3 - Age of First Use: 17 3 - Last Use / Amount: 3 months ago              Sleep: Fair  Appetite:  Fair  Current Medications: Current Facility-Administered Medications  Medication Dose Route Frequency Provider Last Rate Last Dose  . alum & mag hydroxide-simeth (MAALOX/MYLANTA) 200-200-20 MG/5ML suspension 30 mL  30 mL Oral Q4H PRN Worthy Flank, NP      . benztropine (COGENTIN) tablet 0.5 mg  0.5 mg Oral Q2000 Sanjuana Kava, NP   0.5 mg at 03/20/15 2000  . citalopram (CELEXA) tablet 40 mg  40 mg Oral Daily Worthy Flank, NP   40 mg at 03/21/15 1033  . gabapentin (NEURONTIN) capsule 300 mg  300 mg Oral [id number] Kamren Heintzelman, MD   300 mg at 03/21/15 1032  . hydrOXYzine (ATARAX/VISTARIL) tablet 50 mg  50 mg Oral TID PRN Jomarie Longs, MD      . ibuprofen (ADVIL,MOTRIN) tablet 600 mg  600 mg Oral Q6H PRN Ruthy Forry, MD      . magnesium hydroxide (MILK OF MAGNESIA) suspension 30 mL  30 mL Oral Daily PRN Worthy Flank, NP   30 mL at 03/20/15 1052  . multivitamin with minerals tablet 1 tablet  1 tablet Oral Daily Jomarie Longs, MD   1 tablet at 03/21/15 1033  . nicotine polacrilex (NICORETTE) gum 2 mg  2 mg Oral PRN Jomarie Longs, MD   2 mg at 03/20/15 1902  . polyethylene glycol (MIRALAX / GLYCOLAX) packet 17 g  17 g Oral Daily Sanjuana Kava, NP   17 g at 03/20/15 0950  . risperiDONE (RISPERDAL) tablet 6 mg  6 mg Oral QHS Worthy Flank, NP   6 mg at 03/20/15 2125  . thiamine (VITAMIN B-1) tablet 100 mg  100 mg Oral Daily Jomarie Longs, MD   100 mg at 03/21/15  1033    Lab Results:  Results for orders placed or performed during the hospital encounter of 03/17/15 (from the past 48 hour(s))  Urinalysis with microscopic (not at [facility])     Status: Abnormal   Collection Time: 03/20/15  6:44 PM  Result Value Ref Range   Color, Urine YELLOW YELLOW   APPearance CLEAR CLEAR   Specific Gravity, Urine 1.005 1.005 - 1.030   pH 6.5 5.0 - 8.0   Glucose, UA NEGATIVE NEGATIVE mg/dL   Hgb urine dipstick NEGATIVE NEGATIVE   Bilirubin Urine NEGATIVE NEGATIVE   Ketones, ur NEGATIVE NEGATIVE mg/dL   Protein, ur NEGATIVE NEGATIVE mg/dL   Nitrite NEGATIVE NEGATIVE   Leukocytes, UA NEGATIVE NEGATIVE   WBC, UA NONE SEEN 0 - 5 WBC/hpf   RBC / HPF NONE SEEN 0 - 5 RBC/hpf   Bacteria, UA RARE (A) NONE SEEN   Squamous Epithelial / LPF 0-5 (A) NONE SEEN    Comment: Performed at [facility]  Urine culture     Status: None (Preliminary result)   Collection Time: 03/20/15  6:44 PM  Result Value Ref Range   Specimen Description      URINE, CLEAN CATCH Performed at [facility]    Special Requests      Normal Performed at [facility]    Culture      NO GROWTH < 24 HOURS Performed at [facility]    Report Status PENDING     Physical Findings: AIMS: Facial and Oral Movements Muscles of Facial Expression: None, normal Lips and Perioral Area: None, normal Jaw: None, normal Tongue: None, normal,Extremity Movements Upper (arms, wrists, hands, fingers): None, normal Lower (legs, knees, ankles, toes): None, normal, Trunk Movements Neck, shoulders, hips: None, normal, Overall Severity Severity of abnormal movements (highest score from questions above): None, normal Incapacitation due to abnormal movements: None, normal Patient's awareness of abnormal movements (rate only patient's report): No Awareness, Dental Status Current problems with teeth and/or dentures?: Yes ("rotting on the bottom") Does  patient usually wear dentures?: No  CIWA:  CIWA-Ar Total: 0 COWS:     Musculoskeletal: Strength & Muscle Tone: within normal limits Gait & Station: normal Patient leans: N/A  Psychiatric Specialty Exam: Review of Systems  Psychiatric/Behavioral: Positive for depression and substance abuse. Negative for suicidal ideas and hallucinations. The patient is nervous/anxious.   All other systems reviewed and are negative.   Blood pressure 99/66, pulse 91, temperature 98.4 F (36.9 C), temperature source Oral, resp. rate 18, height  (1.651 m), weight 60.782 kg (134 lb), last menstrual period 03/17/2015.Body mass index is 22.3 kg/(m^2).  General Appearance: Disheveled  Eye Solicitor::  Fair  Speech:  Normal Rate  Volume:  Normal  Mood:  Anxious and Depressed  Affect:  Anxious  Thought Process:  Circumstantial  Orientation:  Full (Time, Place, and Person)  Thought Content:  Paranoid Ideation and Rumination  Suicidal Thoughts:  No  Homicidal Thoughts:  No  Memory:  Immediate;   Fair Recent;   Fair Remote;   Fair  Judgement:  Fair  Insight:  Fair  Psychomotor Activity:  Restlessness  Concentration:  Fair  Recall:  [employer] of Knowledge:Fair  Language: Fair  Akathisia:  No  Handed:    AIMS (if indicated):     Assets:  Communication Skills Desire for Improvement Resilience Social Support  ADL's:  Intact  Cognition: WNL  Sleep:  Number of Hours: 6.75   Treatment Plan Summary:Patient is a 45 y old CF, with hx of schizoaffective do as well as polysubstance abuse , CP,seizure do, borderline intellectual functioning, who presents with labile mood and psychosis.. Pt will need inpatient stabilization Daily contact with patient to assess and evaluate symptoms and progress  in treatment and Medication management   PLAN OF CARE:   Reviewed past medical records,treatment plan.  Will continue Gabapentin 300 mg po tid for anxiety/pain/mood sx/seizure do. Continue CIWA/Librium prn for  severe alcohol abuse. Will add Vistaril 50 mg po tid prn for breakthrough anxiety sx. Will continue Risperidone 6 mg po qhs for psychosis. Will continue Cogentin 0.5 mg po qhs for EPS. Will make available PRN medications as per agitation protocol. Will get UA/Ulcx -UA - wnl , Uclx pending. Will continue to monitor vitals ,medication compliance and treatment side effects while patient is here.  Will monitor for medical issues as well as call consult as needed.  Reviewed labs TSH - wnl, PL - slightly elevates - pt however is allergic to a lot of medications - will discuss to follow on an outpt basis - will not change Risperidone. Hba1c- wnl. CSW will start working on disposition.  Patient to participate in therapeutic milieu .     Lionell Matuszak, MD 03/21/2015, 11:44 AM

## 2015-03-21 NOTE — Progress Notes (Signed)
D: Patient observed interacting with staff. Patient educated on medication and its uses.  A: Patient taking medication as ordered. Patient with Q 15 minute checks in progress and maintained. Support and encouragement offered R: Patient remains safe on unit and monitoring continues.Marland Kitchen

## 2015-03-21 NOTE — [facility] Group Notes (Signed)
[facility] Group Notes:  (Nursing/MHT/Case Management/Adjunct)  Date:  03/21/2015  Time: 9:30am  Type of Therapy:  Nurse Education  Participation Level: Did not attend  Participation Quality:    Affect:    Cognitive:    Insight:    Engagement in Group:    Modes of Intervention:  Discussion and Education  Summary of Progress/Problems:  Group topic was Recovery.  Discussed goal setting, sleep hygiene and coping skills.  She did not attend group after much encouragement.   Norm Parcel Margret Moat 03/21/2015, 11:35 AM

## 2015-03-21 NOTE — Progress Notes (Signed)
Patient ID: Sylvia [name], female   DOB: [birth year], 30 y.o.   MRN: [id number] PER STATE REGULATIONS 482.30  THIS CHART WAS REVIEWED FOR MEDICAL NECESSITY WITH RESPECT TO THE PATIENT'S ADMISSION/ DURATION OF STAY.  NEXT REVIEW DATE: 03/25/2015  Willa Rough, RN, BSN CASE MANAGER

## 2015-03-21 NOTE — [facility] Group Notes (Signed)
[facility] Group Notes:  (Counselor/Nursing/MHT/Case Management/Adjunct)  03/21/2015 1:15PM  Type of Therapy:  Group Therapy  Participation Level:  Active  Participation Quality:  Appropriate  Affect:  Flat  Cognitive:  Oriented  Insight:  Improving  Engagement in Group:  Limited  Engagement in Therapy:  Limited  Modes of Intervention:  Discussion, Exploration and Socialization  Summary of Progress/Problems: The topic for group was balance in life.  Pt participated in the discussion about when their life was in balance and out of balance and how this feels.  Pt discussed ways to get back in balance and short term goals they can work on to get where they want to be. Came for the last 10 minutes.  "I feel sluggish, like a turtle, but the turtle beat the hare, so I am fine.  Cited some incident from yesterday "that is behind [place], so we can all move on."  Tangential, with pressured speech.   Sylvia [name] 03/21/2015 1:19 PM

## 2015-03-21 NOTE — [facility] Group Notes (Signed)
Adult Psychoeducational Group Note  Date:  03/21/2015 Time:  8:56 PM  Group Topic/Focus:  Wrap-Up Group:   The focus of this group is to help patients review their daily goal of treatment and discuss progress on daily workbooks.  Participation Level:  Active  Participation Quality:  Appropriate  Affect:  Appropriate  Cognitive:  Alert  Insight: Limited  Engagement in Group:  Engaged  Modes of Intervention:  Discussion  Additional Comments:  Pt stated her day was a 10 and great.  Pt expressed she likes the medications she's on.  Pt is hoping to discharge tomorrow or the next day.  Pt expressed that she talked to her ACT team today.  Pt goal is to get check cashed, laundry done at the hotel she is staying at and get a new ID.    Caroll Rancher A 03/21/2015, 8:56 PM

## 2015-03-21 NOTE — Progress Notes (Signed)
DAR Note: Sylvia [name] stayed in the bed much of the morning.  She did get up later to take her AM medication. She denies SI/HI or A/V hallucination.  She states that she really wants to be discharged soon because she has an appointment at [facility] this week and it is important.  Encouraged her to talk with the doctor and social worker about her discharge plan.  She completed her self inventory and reports that her depression is 2/10, hopelessness is 0/10 and her anxiety is 8/10.  She states that her goal for today is "discharge plan by the second because of doctors appointment" and she will accomplish her goal by "pay attention more."  Encouraged participation in group and unit activities.  Q 15 minute checks maintained for safety.  We will continue to monitor the progress towards her goals.

## 2015-03-22 LAB — URINE CULTURE: SPECIAL REQUESTS: NORMAL

## 2015-03-22 MED ORDER — GABAPENTIN 400 MG PO CAPS
400.0000 mg | ORAL_CAPSULE | Freq: Every day | ORAL | Status: DC
  Administered 2015-03-22 – 2015-03-23 (×2): 400 mg via ORAL
  Filled 2015-03-22 (×4): qty 1

## 2015-03-22 MED ORDER — GABAPENTIN 300 MG PO CAPS
300.0000 mg | ORAL_CAPSULE | ORAL | Status: DC
  Administered 2015-03-23 – 2015-03-24 (×3): 300 mg via ORAL
  Filled 2015-03-22 (×7): qty 1

## 2015-03-22 NOTE — Progress Notes (Signed)
[facility] MD Progress Note 03/22/2015  Sylvia [name]  MRN:  [id number] Subjective: Pt today states that " I am fine. I do not think I have an alcohol problem. I have already taken care of that. I want to go home from here. I am not going to a residential program.'     Objective:Patient is a 51 y old CF, with hx of schizoaffective do as well as polysubstance abuse , CP,seizure do, borderline intellectual functioning, who presents with labile mood and psychosis.  Patient seen and chart reviewed.Discussed patient with treatment team . Pt today seen as restless, anxious , vaguely irritable . Pt continues to be loud on the unit - requiring redirection. Pt continues to lack insight in to her substance abuse , tries to minimize it. Per staff - pt continues to be anxious , labile and loud on and off . Will continue to need inpatient stabilization.     Principal Problem: Schizoaffective disorder, bipolar type (HCC) Diagnosis:   Patient Active Problem List   Diagnosis Date Noted  . 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 Cocaine use disorder, moderate, dependence (HCC) [F14.20] 03/17/2015  . Cannabis use disorder, mild, abuse [F12.10] 03/17/2015  . Alcohol use disorder, severe, dependence (HCC) [F10.20] 03/17/2015  . Schizoaffective disorder, bipolar type (HCC) [F25.0] 03/16/2015  . Seizure disorder (HCC) [G40.909] 09/17/2013  . Cellulitis and abscess of toe of right foot [L03.031, L02.611] 09/16/2013  . Hepatitis C [B19.20]   . CP (cerebral palsy) (HCC) [G80.9]    Total Time spent with patient: 30 minutes  Past Psychiatric History: Hx of schizoaffective do, alcohol abuse severe, cocaine abuse, cannabis abuse , has ACTT - who reports pt also with Cluster B traits. Has had several hospitalizations in the past.  Past Medical History:  Past Medical History  Diagnosis Date  . CP (cerebral palsy) (HCC)   . Hepatitis C   . Herpes   . Stroke (HCC)   . Seizures (HCC)   .  Schizoaffective schizophrenia (HCC) 09/17/2013    Per notes from [facility] as on 2014     Past Surgical History  Procedure Laterality Date  . Leg surgery Right     x7  . 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 Foot surgery      rt and left   Family History:  Family History  Problem Relation Age of Onset  . Bipolar disorder Mother   . Cancer Maternal Grandmother   . Diabetes Maternal Grandfather   . Hypertension Maternal Grandfather    Family Psychiatric  History: See H&P Social History: Is single , currently lives at [facility] stay motel , has a payee , and an ACTT. History  Alcohol Use  . 1.8 oz/week  . 3 Cans of beer per week    Comment: Per patient 0.5 gallon to gallon when she can but since being in group home she can't do it as often      History  Drug Use  . Yes  . Special: "Crack" cocaine, Marijuana    Comment: Per patient used to use herion and crack but none in "a few years"     Social History   Social History  . Marital Status: Single    Spouse Name: N/A  . Number of Children: N/A  . Years of Education: N/A   Social History Main Topics  .  Smoking status: Current Every Day Smoker -- 0.50 packs/day for 20 years    Types: Cigarettes  . Smokeless tobacco: Former Neurosurgeon  . Alcohol Use: 1.8 oz/week    3 Cans of beer per week     Comment: Per patient 0.5 gallon to gallon when she can but since being in group home she can't do it as often   . Drug Use: Yes    Special: "Crack" cocaine, Marijuana     Comment: Per patient used to use herion and crack but none in "a few years"   . Sexual Activity: Not Currently    Birth Control/ Protection: Implant   Other Topics Concern  . None   Social History Narrative   Additional Social History:    Pain Medications: none Prescriptions: see mar Over the Counter: see mar History of  alcohol / drug use?: Yes Name of Substance 1: etoh 1 - Age of First Use: 10 1 - Frequency: daily 1 - Last Use / Amount: 2 days ago Name of Substance 2: herion 2 - Age of First Use: 19 2 - Last Use / Amount: 5 yr ago Name of Substance 3: cocaine 3 - Age of First Use: 17 3 - Last Use / Amount: 3 months ago              Sleep: Fair  Appetite:  Fair  Current Medications: Current Facility-Administered Medications  Medication Dose Route Frequency Provider Last Rate Last Dose  . alum & mag hydroxide-simeth (MAALOX/MYLANTA) 200-200-20 MG/5ML suspension 30 mL  30 mL Oral Q4H PRN Worthy Flank, NP      . benztropine (COGENTIN) tablet 0.5 mg  0.5 mg Oral Q2000 Sanjuana Kava, NP   0.5 mg at 03/21/15 2043  . citalopram (CELEXA) tablet 40 mg  40 mg Oral Daily Worthy Flank, NP   40 mg at 03/22/15 4098  . [START ON 03/23/2015] gabapentin (NEURONTIN) capsule 300 mg  300 mg Oral [id number] Sylvia Jarecki, MD      . gabapentin (NEURONTIN) capsule 400 mg  400 mg Oral QHS Sylvia Alkhatib, MD      . hydrOXYzine (ATARAX/VISTARIL) tablet 50 mg  50 mg Oral TID PRN Jomarie Longs, MD   50 mg at 03/22/15 1418  . ibuprofen (ADVIL,MOTRIN) tablet 600 mg  600 mg Oral Q6H PRN Jomarie Longs, MD   600 mg at 03/22/15 1107  . magnesium hydroxide (MILK OF MAGNESIA) suspension 30 mL  30 mL Oral Daily PRN Worthy Flank, NP   30 mL at 03/22/15 1311  . multivitamin with minerals tablet 1 tablet  1 tablet Oral Daily Jomarie Longs, MD   1 tablet at 03/22/15 1191  . nicotine polacrilex (NICORETTE) gum 2 mg  2 mg Oral PRN Jomarie Longs, MD   2 mg at 03/22/15 1419  . polyethylene glycol (MIRALAX / GLYCOLAX) packet 17 g  17 g Oral Daily Sanjuana Kava, NP   17 g at 03/22/15 4782  . risperiDONE (RISPERDAL) tablet 6 mg  6 mg Oral QHS Worthy Flank, NP   6 mg at 03/21/15 2126  . thiamine (VITAMIN B-1) tablet 100 mg  100 mg Oral Daily Jomarie Longs, MD   100 mg at 03/22/15 9562    Lab Results:  Results for orders  placed or performed during the hospital encounter of 03/17/15 (from the past 48 hour(s))  Urinalysis with microscopic (not at [facility])     Status: Abnormal   Collection Time: 03/20/15  6:44  PM  Result Value Ref Range   Color, Urine YELLOW YELLOW   APPearance CLEAR CLEAR   Specific Gravity, Urine 1.005 1.005 - 1.030   pH 6.5 5.0 - 8.0   Glucose, UA NEGATIVE NEGATIVE mg/dL   Hgb urine dipstick NEGATIVE NEGATIVE   Bilirubin Urine NEGATIVE NEGATIVE   Ketones, ur NEGATIVE NEGATIVE mg/dL   Protein, ur NEGATIVE NEGATIVE mg/dL   Nitrite NEGATIVE NEGATIVE   Leukocytes, UA NEGATIVE NEGATIVE   WBC, UA NONE SEEN 0 - 5 WBC/hpf   RBC / HPF NONE SEEN 0 - 5 RBC/hpf   Bacteria, UA RARE (A) NONE SEEN   Squamous Epithelial / LPF 0-5 (A) NONE SEEN    Comment: Performed at [facility]  Urine culture     Status: None   Collection Time: 03/20/15  6:44 PM  Result Value Ref Range   Specimen Description      URINE, CLEAN CATCH Performed at [facility]    Special Requests      Normal Performed at [facility]    Culture      MULTIPLE SPECIES PRESENT, SUGGEST RECOLLECTION Performed at [facility]    Report Status 03/22/2015 FINAL     Physical Findings: AIMS: Facial and Oral Movements Muscles of Facial Expression: None, normal Lips and Perioral Area: None, normal Jaw: None, normal Tongue: None, normal,Extremity Movements Upper (arms, wrists, hands, fingers): None, normal Lower (legs, knees, ankles, toes): None, normal, Trunk Movements Neck, shoulders, hips: None, normal, Overall Severity Severity of abnormal movements (highest score from questions above): None, normal Incapacitation due to abnormal movements: None, normal Patient's awareness of abnormal movements (rate only patient's report): No Awareness, Dental Status Current problems with teeth and/or dentures?: Yes Does patient usually wear dentures?: No  CIWA:  CIWA-Ar Total:  2 COWS:     Musculoskeletal: Strength & Muscle Tone: within normal limits Gait & Station: normal Patient leans: N/A  Psychiatric Specialty Exam: Review of Systems  Psychiatric/Behavioral: Positive for depression and substance abuse. Negative for suicidal ideas and hallucinations. The patient is nervous/anxious.   All other systems reviewed and are negative.   Blood pressure 106/71, pulse 92, temperature 98.2 F (36.8 C), temperature source Oral, resp. rate 16, height  (1.651 m), weight 60.782 kg (134 lb), last menstrual period 03/17/2015.Body mass index is 22.3 kg/(m^2).  General Appearance: Fairly Groomed  Patent attorney::  Fair  Speech:  Normal Rate  Volume:  Increased  Mood:  Anxious and Irritable  Affect:  Anxious  Thought Process:  Circumstantial  Orientation:  Full (Time, Place, and Person)  Thought Content:  Paranoid Ideation and Rumination  Suicidal Thoughts:  No  Homicidal Thoughts:  No  Memory:  Immediate;   Fair Recent;   Fair Remote;   Fair  Judgement:  Fair  Insight:  Fair  Psychomotor Activity:  Restlessness  Concentration:  Fair  Recall:  [employer] of Knowledge:Fair  Language: Fair  Akathisia:  No  Handed:    AIMS (if indicated):     Assets:  Communication Skills Desire for Improvement Resilience Social Support  ADL's:  Intact  Cognition: WNL  Sleep:  Number of Hours: 6   Treatment Plan Summary:Patient is a 1 y old CF, with hx of schizoaffective do as well as polysubstance abuse , CP,seizure do, borderline intellectual functioning, who presents with labile mood and psychosis.. Pt continues to progress, continues to have vague irritability. Daily contact with patient to assess and evaluate symptoms  and progress in treatment and Medication management   PLAN OF CARE:   Reviewed past medical records,treatment plan.  Will increase Gabapentin to 300 mg po bid and 400 mg po qhs for mood sx, seizure do as well as pain. Continue CIWA/Librium prn for  severe alcohol abuse. Will continue Vistaril 50 mg po tid prn for breakthrough anxiety sx. Will continue Risperidone 6 mg po qhs for psychosis. Will continue Cogentin 0.5 mg po qhs for EPS. Will make available PRN medications as per agitation protocol. Will get UA/Ulcx -UA - wnl. Will continue to monitor vitals ,medication compliance and treatment side effects while patient is here.  Will monitor for medical issues as well as call consult as needed.  Reviewed labs TSH - wnl, PL - slightly elevates - pt however is allergic to a lot of medications - will discuss to follow on an outpt basis - will not change Risperidone. Hba1c- wnl. CSW will start working on disposition.  Patient to participate in therapeutic milieu .     Tamikka Pilger, MD 03/22/2015, 2:37 PM

## 2015-03-22 NOTE — Plan of Care (Signed)
Problem: Alteration in mood & ability to function due to Goal: STG-Patient will comply with prescribed medication regimen (Patient will comply with prescribed medication regimen)  Outcome: Progressing Pt compliant with medication regime     

## 2015-03-22 NOTE — Progress Notes (Signed)
Patient ID: Sylvia [name], female   DOB: 1985/09/03, 30 y.o.   MRN: [id number] D: "it's was a busy day but I ready to go to bed". Pt mood and affect appeared depressed and anxious. Pt reports tolerating medication well. Pt denies SI/HI/AVH and pain. Pt attended and participated in evening wrap up group. Cooperative with assessment. No acute distressed noted at this time.   A: Met with pt 1:1. Medications administered as prescribed. Support and encouragement provided to attend groups and engage in milieu. Pt encouraged to discuss feelings and come to staff with any question or concerns.   R: Patient remains safe and complaint with medications.

## 2015-03-22 NOTE — Progress Notes (Signed)
Adult Psychoeducational Group Note  Date:  03/22/2015 Time:  9:42 PM  Group Topic/Focus:  Wrap-Up Group:   The focus of this group is to help patients review their daily goal of treatment and discuss progress on daily workbooks.  Participation Level:  Active  Participation Quality:  Appropriate  Affect:  Appropriate  Cognitive:  Alert  Insight: Appropriate  Engagement in Group:  Engaged  Modes of Intervention:  Discussion  Additional Comments:  Patient goal for today was to work on her discharge plan. Patient stated she was going home on Friday. On a scale between 1-10, (1=worse, 10=best) patient rated her day as a 7 because "I had great conversations with my aunt and friends".  Chord Takahashi L Ragina Fenter 03/22/2015, 9:42 PM

## 2015-03-22 NOTE — [facility] Group Notes (Signed)
[facility] LCSW Group Therapy  03/22/2015 2:02 PM  Type of Therapy: Group Therapy  Participation Level: Active  Participation Quality: Attentive  Affect: Flat  Cognitive: Oriented  Insight: Limited  Engagement in Therapy: Engaged  Modes of Intervention: Discussion and Socialization  Summary of Progress/Problems: Onalee Hua from [employer] was here to tell his story of recovery and play his guitar. Pt was pleasant and alert. Pt's comments were appropriate.   Vito Backers. Beverely Pace 03/22/2015 2:02 PM

## 2015-03-22 NOTE — [facility] Group Notes (Signed)
[facility] LCSW Aftercare Discharge Planning Group Note   03/22/2015 9:18 AM  Participation Quality:  Engaged  Mood/Affect:  Defensive  Depression Rating:    Anxiety Rating:    Thoughts of Suicide:  No Will you contract for safety?   NA  Current AVH:  No  Plan for Discharge/Comments:  Unable to identify sobriety plan.  Says it is already under control.  States she is ready to go home, follow up with ACT team.  Transportation Means:   Supports:  Sylvia [name] B

## 2015-03-22 NOTE — Progress Notes (Signed)
D: Patient observed on unit interacting with staff and peers. Patient states her day was a "10 and that she is hoping to get discharge tomorrow. Patient also states she has been in touch with her ACT team and setting things up to get her medications and care organized when she leaves." A: Support and encouragement offered. Q 15 minute checks in progress and maintained for safety. R: Patient remains safe on unit and monitoring continues.

## 2015-03-22 NOTE — Progress Notes (Signed)
DAR NOTE: Pt present with flat affect and depressed mood in the unit. Pt has been observed in the dayroom interacting with peers and staff, pt could get loud at some point but was easily redirected. Pt denies physical pain, took all her meds as scheduled. As per self inventory, pt had a good night sleep, good appetite, normal energy, and good concentration. Pt rate depression at 2, hopeless ness at 0, and anxiety at 5. Pt's safety ensured with 15 minute and environmental checks. Pt currently denies SI/HI and A/V hallucinations. Pt verbally agrees to seek staff if SI/HI or A/VH occurs and to consult with staff before acting on these thoughts. Will continue POC.

## 2015-03-23 MED ORDER — CITALOPRAM HYDROBROMIDE 20 MG PO TABS
20.0000 mg | ORAL_TABLET | Freq: Every day | ORAL | Status: DC
  Administered 2015-03-24: 20 mg via ORAL
  Filled 2015-03-23 (×3): qty 1

## 2015-03-23 NOTE — Progress Notes (Signed)
Patient has been engaged appropriately in unit activities.  Patient did not talk with writer this shift.  Patient denies SI, HI and AVH.    Assess patient for safety, offer medications as prescribed, engage patient in 1:1 staff talks   Patient able to contract for safety

## 2015-03-23 NOTE — Progress Notes (Signed)
[facility] MD Progress Note 03/23/2015  Sylvia [name]  MRN:  [id number] Subjective: Pt today states that " I had this bad dream last night. I urinated in my sleep. I am fine with the gabapentin dose being increased.'      Objective:Patient is a 89 y old CF, with hx of schizoaffective do as well as polysubstance abuse , CP,seizure do, borderline intellectual functioning, who presents with labile mood and psychosis.  Patient seen and chart reviewed.Discussed patient with treatment team . Pt today seen as less restless, less anxious ,is more calm than the past few days. Pt denies any new concerns and wants to stay on the higher dose of Gabapentin , states it helps her. Pt continues to lack insight in to her substance abuse , tries to minimize it. Per staff - pt continues to need encouragement and support.  Will continue to need inpatient stabilization.     Principal Problem: Schizoaffective disorder, bipolar type (HCC) Diagnosis:   Patient Active Problem List   Diagnosis Date Noted  . 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 Cocaine use disorder, moderate, dependence (HCC) [F14.20] 03/17/2015  . Cannabis use disorder, mild, abuse [F12.10] 03/17/2015  . Alcohol use disorder, severe, dependence (HCC) [F10.20] 03/17/2015  . Schizoaffective disorder, bipolar type (HCC) [F25.0] 03/16/2015  . Seizure disorder (HCC) [G40.909] 09/17/2013  . Cellulitis and abscess of toe of right foot [L03.031, L02.611] 09/16/2013  . Hepatitis C [B19.20]   . CP (cerebral palsy) (HCC) [G80.9]    Total Time spent with patient: 30 minutes  Past Psychiatric History: Hx of schizoaffective do, alcohol abuse severe, cocaine abuse, cannabis abuse , has ACTT - who reports pt also with Cluster B traits. Has had several hospitalizations in the past.  Past Medical History:  Past Medical History  Diagnosis Date  . CP (cerebral palsy) (HCC)   . Hepatitis C   . Herpes   . Stroke (HCC)   . Seizures (HCC)    . Schizoaffective schizophrenia (HCC) 09/17/2013    Per notes from [facility] as on 2014     Past Surgical History  Procedure Laterality Date  . Leg surgery Right     x7  . 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 Foot surgery      rt and left   Family History:  Family History  Problem Relation Age of Onset  . Bipolar disorder Mother   . Cancer Maternal Grandmother   . Diabetes Maternal Grandfather   . Hypertension Maternal Grandfather    Family Psychiatric  History: See H&P Social History: Is single , currently lives at Extended stay motel , has a payee , and an ACTT. History  Alcohol Use  . 1.8 oz/week  . 3 Cans of beer per week    Comment: Per patient 0.5 gallon to gallon when she can but since being in group home she can't do it as often      History  Drug Use  . Yes  . Special: "Crack" cocaine, Marijuana    Comment: Per patient used to use herion and crack but none in "a few years"     Social History   Social History  . Marital Status: Single    Spouse Name: N/A  . Number of Children: N/A  . Years of Education: N/A   Social History Main Topics  . Smoking  status: Current Every Day Smoker -- 0.50 packs/day for 20 years    Types: Cigarettes  . Smokeless tobacco: Former Neurosurgeon  . Alcohol Use: 1.8 oz/week    3 Cans of beer per week     Comment: Per patient 0.5 gallon to gallon when she can but since being in group home she can't do it as often   . Drug Use: Yes    Special: "Crack" cocaine, Marijuana     Comment: Per patient used to use herion and crack but none in "a few years"   . Sexual Activity: Not Currently    Birth Control/ Protection: Implant   Other Topics Concern  . None   Social History Narrative   Additional Social History:    Pain Medications: none Prescriptions: see mar Over the Counter: see mar History of  alcohol / drug use?: Yes Name of Substance 1: etoh 1 - Age of First Use: 10 1 - Frequency: daily 1 - Last Use / Amount: 2 days ago Name of Substance 2: herion 2 - Age of First Use: 19 2 - Last Use / Amount: 5 yr ago Name of Substance 3: cocaine 3 - Age of First Use: 17 3 - Last Use / Amount: 3 months ago              Sleep: Fair  Appetite:  Fair  Current Medications: Current Facility-Administered Medications  Medication Dose Route Frequency Provider Last Rate Last Dose  . alum & mag hydroxide-simeth (MAALOX/MYLANTA) 200-200-20 MG/5ML suspension 30 mL  30 mL Oral Q4H PRN Worthy Flank, NP      . benztropine (COGENTIN) tablet 0.5 mg  0.5 mg Oral Q2000 Sanjuana Kava, NP   0.5 mg at 03/22/15 1958  . [START ON 03/24/2015] citalopram (CELEXA) tablet 20 mg  20 mg Oral Daily Kathern Lobosco, MD      . gabapentin (NEURONTIN) capsule 300 mg  300 mg Oral [id number] Jomarie Longs, MD   300 mg at 03/23/15 0825  . gabapentin (NEURONTIN) capsule 400 mg  400 mg Oral QHS Jaiel Saraceno, MD   400 mg at 03/22/15 2117  . hydrOXYzine (ATARAX/VISTARIL) tablet 50 mg  50 mg Oral TID PRN Jomarie Longs, MD   50 mg at 03/23/15 0827  . ibuprofen (ADVIL,MOTRIN) tablet 600 mg  600 mg Oral Q6H PRN Jomarie Longs, MD   600 mg at 03/22/15 1107  . magnesium hydroxide (MILK OF MAGNESIA) suspension 30 mL  30 mL Oral Daily PRN Worthy Flank, NP   30 mL at 03/22/15 1311  . multivitamin with minerals tablet 1 tablet  1 tablet Oral Daily Jomarie Longs, MD   1 tablet at 03/23/15 0825  . nicotine polacrilex (NICORETTE) gum 2 mg  2 mg Oral PRN Jomarie Longs, MD   2 mg at 03/23/15 1029  . polyethylene glycol (MIRALAX / GLYCOLAX) packet 17 g  17 g Oral Daily Sanjuana Kava, NP   17 g at 03/22/15 0981  . risperiDONE (RISPERDAL) tablet 6 mg  6 mg Oral QHS Worthy Flank, NP   6 mg at 03/22/15 2117  . thiamine (VITAMIN B-1) tablet 100 mg  100 mg Oral Daily Jomarie Longs, MD   100 mg at 03/23/15 0825    Lab Results:  No  results found for this or any previous visit (from the past 48 hour(s)).  Physical Findings: AIMS: Facial and Oral Movements Muscles of Facial Expression: None, normal Lips and Perioral Area: None, normal Jaw: None,  normal Tongue: None, normal,Extremity Movements Upper (arms, wrists, hands, fingers): None, normal Lower (legs, knees, ankles, toes): None, normal, Trunk Movements Neck, shoulders, hips: None, normal, Overall Severity Severity of abnormal movements (highest score from questions above): None, normal Incapacitation due to abnormal movements: None, normal Patient's awareness of abnormal movements (rate only patient's report): No Awareness, Dental Status Current problems with teeth and/or dentures?: Yes Does patient usually wear dentures?: No  CIWA:  CIWA-Ar Total: 0 COWS:     Musculoskeletal: Strength & Muscle Tone: within normal limits Gait & Station: normal Patient leans: N/A  Psychiatric Specialty Exam: Review of Systems  Psychiatric/Behavioral: Positive for depression and substance abuse. Negative for suicidal ideas and hallucinations. The patient is nervous/anxious.   All other systems reviewed and are negative.   Blood pressure 107/60, pulse 91, temperature 97.6 F (36.4 C), temperature source Oral, resp. rate 16, height  (1.651 m), weight 60.782 kg (134 lb), last menstrual period 03/17/2015.Body mass index is 22.3 kg/(m^2).  General Appearance: Fairly Groomed  Patent attorney::  Fair  Speech:  Normal Rate  Volume:  Increased  Mood:  Anxious and Irritable improving  Affect:  Anxious  Thought Process:  Circumstantial  Orientation:  Full (Time, Place, and Person)  Thought Content:  Paranoid Ideation and Rumination improving  Suicidal Thoughts:  No  Homicidal Thoughts:  No  Memory:  Immediate;   Fair Recent;   Fair Remote;   Fair  Judgement:  Fair  Insight:  Fair  Psychomotor Activity:  Restlessness  Concentration:  Fair  Recall:  [employer] of  Knowledge:Fair  Language: Fair  Akathisia:  No  Handed:    AIMS (if indicated):     Assets:  Communication Skills Desire for Improvement Resilience Social Support  ADL's:  Intact  Cognition: WNL  Sleep:  Number of Hours: 6.75   Treatment Plan Summary:Patient is a 47 y old CF, with hx of schizoaffective do as well as polysubstance abuse , CP,seizure do, borderline intellectual functioning, who presents with labile mood and psychosis.. Pt continues to progress, continues to have vague irritability. Daily contact with patient to assess and evaluate symptoms and progress in treatment and Medication management   PLAN OF CARE:   Reviewed past medical records,treatment plan.  Will continue Gabapentin  300 mg po bid and 400 mg po qhs for mood sx, seizure do as well as pain. Reduce Celexa to 20 mg po daily - for affective sx since it may be activating. Will continue Vistaril 50 mg po tid prn for breakthrough anxiety sx. Will continue Risperidone 6 mg po qhs for psychosis. Will continue Cogentin 0.5 mg po qhs for EPS. Will make available PRN medications as per agitation protocol. Will continue to monitor vitals ,medication compliance and treatment side effects while patient is here.  Will monitor for medical issues as well as call consult as needed.  Reviewed labs TSH - wnl, PL - slightly elevates - pt however is allergic to a lot of medications - will discuss to follow on an outpt basis - will not change Risperidone. Hba1c- wnl. CSW will start working on disposition.  Patient to participate in therapeutic milieu .     Jyla Hopf, MD 03/23/2015, 2:03 PM

## 2015-03-23 NOTE — [facility] Group Notes (Signed)
Pt did not attend karaoke group.  Dorthy Magnussen, MHT  

## 2015-03-23 NOTE — [facility] Group Notes (Signed)
[facility] LCSW Group Therapy  03/23/2015 1:15 pm  Type of Therapy: Process Group Therapy  Participation Level:  Active  Participation Quality:  Appropriate  Affect:  Flat  Cognitive:  Oriented  Insight:  Improving  Engagement in Group:  Limited  Engagement in Therapy:  Limited  Modes of Intervention:  Activity, Clarification, Education, Problem-solving and Support  Summary of Progress/Problems: Today's group addressed the issue of overcoming obstacles.  Patients were asked to identify their biggest obstacle post d/c that stands in the way of their on-going success, and then problem solve as to how to manage this. Gave supportive and challenging feedback to another patient who was having difficulty hearing it.  Would not give up, but finally left when she became frustrated. This is a patient she had lived with in a group home, and disputed a lot of what the other patient was saying while also being supportive. She eventually returned, and gave others feedback as well.  Never answered the group question for self.  Continues to need redirection, which is challenging.  Daryel Gerald B 03/23/2015   1:46 PM

## 2015-03-23 NOTE — Tx Team (Signed)
Interdisciplinary Treatment Plan Update (Adult)  Date:  03/23/2015 Time Reviewed:  10:18 AM  Progress in Treatment: Attending groups: Yes. Participating in groups: Yes. Taking medication as prescribed:  Yes. Tolerating medication:  Yes. Family/Significant othe contact made:  Yes, individual(s) contacted:  Renne Crigler (Candelaria Arenas) [phone] Patient understands diagnosis:  Yes, as evidenced by seeking help with anxiety and medication. Discussing patient identified problems/goals with staff:  Yes, see initial care plan. Medical problems stabilized or resolved:  Yes Denies suicidal/homicidal ideation: Yes. Issues/concerns per patient self-inventory: No. Other:  New problem(s) identified:   Discharge Plan or Barriers: See below   Reason for Continuation of Hospitalization:   Comments: Caucasian female, 30 years old was evaluated this morning after she came to the ER asking to be allowed in the evidence box. On arrival to the ER it was documented that she had white substance under and in her left Nostril. Patient admitted that she relapsed on Cocaine 6 months ago and has been using off and on. She stated that she is tired of Police harrassment because she always goes to the wrong place. She reports a diagnosis of Bipolar disorder and states that she has not been getting her medications from her ACT team. Patient is also angry that she has not received her $40 aweek fund from [employer].for 2 weeks. She stated that she "flipped out" because she has not gotten her money. Patient has a guardian but lives alone. She has been accepted for admission and we will be seeking placement at any facility with available bed. Celexa, Depakote, Neurontin, Risperdal trial   03/23/15: Will increase Gabapentin to 300 mg po bid and 400 mg po qhs for mood sx, seizure do as well as pain. Will continue Vistaril 50 mg po tid prn for breakthrough anxiety sx. Will continue Risperidone 6 mg po qhs for  psychosis.  Estimated length of stay: Likley d/c tomorrow  New goal(s):  Review of initial/current patient goals per problem list:  1. Goal(s): Patient will participate in aftercare plan  Met:Yes   Target date: at discharge  As evidenced by: Patient will participate within aftercare plan AEB aftercare provider and housing plan at discharge being identified.  03/17/15: Pt will return to extended stay hotel and follow-up outpt with her ACT team.  3. Goal(s): Patient will demonstrate decreased signs and symptoms of anxiety.  Met:Yes  Target date: at discharge  As evidenced by: Patient will utilize self rating of anxiety at 3 or below and demonstrated decreased signs of anxiety, or be deemed stable for discharge by MD  03/17/15: Pt rates anxiety at 5/10 today. 03/23/15:  Rates anxiety a 5.  "I'm a normally anxious person.  I'm fine."  4. Goal(s): Patient will demonstrate decreased signs of psychosis.  Met: Yes  Target date:at discharge  As evidenced by: Patient will demonstrate decreased signs of psychosis as evidenced by a reduction in AVH, paranoia, and/or delusions.   03/17/15: Pt denies AVH. However, pt seems to be responding to internal stimuli as evidenced by thought blocking and disorganized thinking. 03/23/15:  No signs nor symptoms of psychosis today   6. Goal (s): Patient will demonstrate decreased signs of mania  * Met: Yes  * Target date: 3-5 days post admission date  * As evidenced by: Patient demonstrate decreased signs of mania AEB decreased mood instability and demonstration of stable mood   03/17/15: Pt is still exhibiting pressured speech and labile mood. 03/23/15:  Mood is stable.    Attendees: Patient:  03/23/2015 10:18 AM  Family:   03/23/2015 10:18 AM  Physician:  Dr. Ursula Alert, MD 03/23/2015 10:18 AM  Nursing:  Hedy Jacob, RN  03/23/2015 10:18 AM  Case Manager:  Roque Lias, LCSW 03/23/2015 10:18 AM  Counselor:  Matthew Saras, MSW  Intern 03/23/2015 10:18 AM  Other:   03/23/2015 10:18 AM  Other:   03/23/2015 10:18 AM  Other:   03/23/2015 10:18 AM  Other:  03/23/2015 10:18 AM  Other:    Other:    Other:    Other:    Other:    Other:      Scribe for Treatment Team:   Georga Kaufmann, MSW Intern 03/23/2015 10:18 AM

## 2015-03-24 MED ORDER — CITALOPRAM HYDROBROMIDE 20 MG PO TABS: 20.0000 mg | ORAL_TABLET | Freq: Every day | ORAL | Status: DC

## 2015-03-24 MED ORDER — GABAPENTIN 300 MG PO CAPS: 300.0000 mg | ORAL_CAPSULE | ORAL | Status: DC

## 2015-03-24 MED ORDER — BENZTROPINE MESYLATE 0.5 MG PO TABS: 0.5000 mg | ORAL_TABLET | Freq: Every day | ORAL | Status: DC

## 2015-03-24 MED ORDER — NICOTINE POLACRILEX 2 MG MT GUM: 2.0000 mg | CHEWING_GUM | OROMUCOSAL | Status: DC | PRN

## 2015-03-24 MED ORDER — RISPERIDONE 3 MG PO TABS: 6.0000 mg | ORAL_TABLET | Freq: Every day | ORAL | Status: DC

## 2015-03-24 MED ORDER — HYDROXYZINE HCL 50 MG PO TABS: 50.0000 mg | ORAL_TABLET | Freq: Three times a day (TID) | ORAL | Status: DC | PRN

## 2015-03-24 MED ORDER — GABAPENTIN 400 MG PO CAPS: 400.0000 mg | ORAL_CAPSULE | Freq: Every day | ORAL | Status: DC

## 2015-03-24 NOTE — Progress Notes (Signed)
Patient discharged home with prescriptions. Patient was stable and appreciative at that time. All papers and prescriptions were given and valuables returned. Verbal understanding expressed. Denies SI/HI and A/VH. Patient given opportunity to express concerns and ask questions.  
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
## 2015-03-24 NOTE — Progress Notes (Signed)
[name] is seen out in the milieu this morning. She took her scheduled meds as planned without diff. SHe completed her daily assessment and on it she wrote she denied SI today and she rated her depression, hopelessness and anxiety " 0/0/5", respectively.    A Her mood is upbeat, her speech is slightly fast and pressured, her thoughts are a little hypomanic, and she is VERY excited about being discharged home today. . D DC planning: ACT team to pick her up here at 1300.

## 2015-03-24 NOTE — Progress Notes (Signed)
  [facility] Adult Case Management Discharge Plan :  Will you be returning to the same living situation after discharge:  Yes,  home At discharge, do you have transportation home?: Yes,  ACT team Do you have the ability to pay for your medications: Yes,  MCD  Release of information consent forms completed and in the chart;  Patient's signature needed at discharge.  Patient to Follow up at: Follow-up Information    Follow up with [facility].   Why:  They will pick you up the day of d/c   Contact information:   [address]  [place] [[phone]      Next level of care provider has access to [facility] Link:no  Safety Planning and Suicide Prevention discussed: Yes,  yes  Have you used any form of tobacco in the last 30 days? (Cigarettes, Smokeless Tobacco, Cigars, and/or Pipes): Yes  Has patient been referred to the Quitline?: Patient refused referral  Patient has been referred for addiction treatment: Yes  Daryel Gerald B 03/24/2015, 10:50 AM

## 2015-03-24 NOTE — Plan of Care (Signed)
Problem: Ineffective individual coping Goal: STG: Patient will remain free from self harm Outcome: Progressing Pt safe at this time      

## 2015-03-24 NOTE — Progress Notes (Signed)
D: Pt denies SI/HI/AVH. Pt is pleasant and cooperative. Pt forwards little, but pt came to med window to get medications.   A: Pt was offered support and encouragement. Pt was given scheduled medications. Pt was encourage to attend groups. Q 15 minute checks were done for safety.   R:Pt attends groups and interacts well with peers and staff. Pt is taking medication. Pt has no complaints at this time .Pt receptive to treatment and safety maintained on unit.

## 2015-03-24 NOTE — [facility] Suicide Risk Assessment (Signed)
[facility] Discharge Suicide Risk Assessment   Principal Problem: Schizoaffective disorder, bipolar type [facility]) Discharge Diagnoses:  Patient Active Problem List   Diagnosis Date Noted  . 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 Cocaine use disorder, moderate, dependence (HCC) [F14.20] 03/17/2015  . Cannabis use disorder, mild, abuse [F12.10] 03/17/2015  . Alcohol use disorder, severe, dependence (HCC) [F10.20] 03/17/2015  . Schizoaffective disorder, bipolar type (HCC) [F25.0] 03/16/2015  . Seizure disorder (HCC) [G40.909] 09/17/2013  . Cellulitis and abscess of toe of right foot [L03.031, L02.611] 09/16/2013  . Hepatitis C [B19.20]   . CP (cerebral palsy) (HCC) [G80.9]     Total Time spent with patient: 30 minutes  Musculoskeletal: Strength & Muscle Tone: within normal limits Gait & Station: normal Patient leans: N/A  Psychiatric Specialty Exam: Review of Systems  Psychiatric/Behavioral: Positive for substance abuse (chronic). Negative for depression and suicidal ideas.  All other systems reviewed and are negative.   Blood pressure 109/73, pulse 108, temperature 97.9 F (36.6 C), temperature source Oral, resp. rate 18, height  (1.651 m), weight 60.782 kg (134 lb), last menstrual period 03/17/2015.Body mass index is 22.3 kg/(m^2).  General Appearance: Casual  Eye Contact::  Fair  Speech:  Clear and Coherent409  Volume:  Normal  Mood:  Euthymic  Affect:  Appropriate  Thought Process:  Goal Directed  Orientation:  Full (Time, Place, and Person)  Thought Content:  WDL  Suicidal Thoughts:  No  Homicidal Thoughts:  No  Memory:  Immediate;   Fair Recent;   Fair Remote;   Fair  Judgement:  Fair  Insight:  Fair  Psychomotor Activity:  Normal  Concentration:  Fair  Recall:  [employer] of Knowledge:Fair  Language: Fair  Akathisia:  No  Handed:  Right  AIMS (if indicated):     Assets:  Desire for Improvement  Sleep:  Number of Hours: 6.5  Cognition: WNL   ADL's:  Intact   Mental Status Per Nursing Assessment::   On Admission:  NA  Demographic Factors:  Caucasian  Loss Factors: NA  Historical Factors: Impulsivity  Risk Reduction Factors:   Positive social support  Continued Clinical Symptoms:  Alcohol/Substance Abuse/Dependencies Previous Psychiatric Diagnoses and Treatments  Cognitive Features That Contribute To Risk:  Polarized thinking    Suicide Risk:  Minimal: No identifiable suicidal ideation.  Patients presenting with no risk factors but with morbid ruminations; may be classified as minimal risk based on the severity of the depressive symptoms    Plan Of Care/Follow-up recommendations:  Activity:  no restrictions Diet:  regular Tests:  Follow up prolactin level on an out patient basis Other:  none  Marlo Arriola, MD 03/24/2015, 9:38 AM

## 2015-07-05 ENCOUNTER — Encounter (HOSPITAL_COMMUNITY): Payer: Self-pay | Admitting: Emergency Medicine

## 2015-07-05 ENCOUNTER — Emergency Department (HOSPITAL_COMMUNITY)
Admission: EM | Admit: 2015-07-05 | Discharge: 2015-07-05 | Disposition: A | Discharge status: Home / Self Care | Payer: Medicare Other | Attending: Emergency Medicine | Admitting: Emergency Medicine

## 2015-07-05 DIAGNOSIS — T7421XA Adult sexual abuse, confirmed, initial encounter: Secondary | ICD-10-CM | POA: Diagnosis present

## 2015-07-05 DIAGNOSIS — G809 Cerebral palsy, unspecified: Secondary | ICD-10-CM | POA: Diagnosis not present

## 2015-07-05 DIAGNOSIS — B1920 Unspecified viral hepatitis C without hepatic coma: Secondary | ICD-10-CM | POA: Insufficient documentation

## 2015-07-05 DIAGNOSIS — F17210 Nicotine dependence, cigarettes, uncomplicated: Secondary | ICD-10-CM | POA: Diagnosis not present

## 2015-07-05 DIAGNOSIS — F259 Schizoaffective disorder, unspecified: Secondary | ICD-10-CM | POA: Diagnosis not present

## 2015-07-05 DIAGNOSIS — Z8673 Personal history of transient ischemic attack (TIA), and cerebral infarction without residual deficits: Secondary | ICD-10-CM | POA: Insufficient documentation

## 2015-07-05 DIAGNOSIS — Z79899 Other long term (current) drug therapy: Secondary | ICD-10-CM | POA: Diagnosis not present

## 2015-07-05 LAB — I-STAT BETA HCG BLOOD, ED (MC, WL, AP ONLY): I-stat hCG, quantitative: <5 m[IU]/mL (ref <5)

## 2015-07-05 MED ORDER — DOLUTEGRAVIR SODIUM 50 MG PO TABS
50.0000 mg | ORAL_TABLET | Freq: Every day | ORAL | Status: DC
Start: 2015-07-05 — End: 2017-02-04

## 2015-07-05 MED ORDER — CEFTRIAXONE SODIUM 250 MG IJ SOLR
250.0000 mg | Freq: Once | INTRAMUSCULAR | Status: AC
  Administered 2015-07-05: 250 mg via INTRAMUSCULAR
  Filled 2015-07-05: qty 250

## 2015-07-05 MED ORDER — ULIPRISTAL ACETATE 30 MG PO TABS
30.0000 mg | ORAL_TABLET | Freq: Once | ORAL | Status: AC
  Administered 2015-07-05: 30 mg via ORAL

## 2015-07-05 MED ORDER — AZITHROMYCIN 250 MG PO TABS
500.0000 mg | ORAL_TABLET | Freq: Once | ORAL | Status: AC
  Administered 2015-07-05: 500 mg via ORAL
  Filled 2015-07-05: qty 2

## 2015-07-05 MED ORDER — METRONIDAZOLE 500 MG PO TABS
2000.0000 mg | ORAL_TABLET | Freq: Once | ORAL | Status: AC
  Administered 2015-07-05: 2000 mg via ORAL

## 2015-07-05 MED ORDER — EMTRICITABINE-TENOFOVIR DF 200-300 MG PO TABS: 1.0000 | ORAL_TABLET | Freq: Every day | ORAL | Status: DC

## 2015-07-05 MED ORDER — LIDOCAINE HCL 2 % IJ SOLN
INTRAMUSCULAR | Status: AC
  Administered 2015-07-05: 400 mg
  Filled 2015-07-05: qty 20

## 2015-07-05 MED ORDER — AZITHROMYCIN 1 G PO PACK
1.0000 g | PACK | Freq: Once | ORAL | Status: DC
  Filled 2015-07-05: qty 1

## 2015-07-05 NOTE — SANE Note (Signed)
Updated Dr. Preston FleetingGlick and Jeanice LimHolly, RN and responding officers. Patient is sleeping in room.
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
## 2015-07-05 NOTE — ED Notes (Signed)
Pt ambulated to bathroom with one staff assist. Pt given water to drink and placed back in the bed.

## 2015-07-05 NOTE — Progress Notes (Signed)
Entered in d/c instructions Jeanice LimDeborah Anne Haubert, PA-C Go on 07/19/2015 You have been scheduled to follow up with Debbie to continue your evalulation, treatment and further 3 & 6 month follow labs need related to your recent exposure [address] [place] [id number] [phone]

## 2015-07-05 NOTE — Progress Notes (Signed)
[phone] with fax confirmation received

## 2015-07-05 NOTE — ED Notes (Signed)
Pt provided with Zithromax drink; pt informed that drink contained her antibiotic and that she needed to drink it; pt verbalized understanding but did not take any drinks from cup; this nurse reiterated that drink contained antibiotic and that pt requested; pt did not respond to this nurse and went back to sleep

## 2015-07-05 NOTE — ED Notes (Signed)
SANE nurse at bedside.
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
## 2015-07-05 NOTE — ED Notes (Signed)
Current SANE nurse states that limited information can be obtained due to pt's intoxication; SANE nurse recommends day shift SANE nurse follow-up when pt is more sober
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
## 2015-07-05 NOTE — ED Notes (Signed)
BP improved after ambulation.

## 2015-07-05 NOTE — ED Notes (Addendum)
Pt having several vocal outbursts, including profanity. Pt appears to speaking to someone not present

## 2015-07-06 ENCOUNTER — Emergency Department (HOSPITAL_COMMUNITY)
Admission: EM | Admit: 2015-07-06 | Discharge: 2015-07-07 | Disposition: A | Discharge status: Home / Self Care | Payer: Medicare Other | Attending: Emergency Medicine | Admitting: Emergency Medicine

## 2015-07-06 ENCOUNTER — Encounter (HOSPITAL_COMMUNITY): Payer: Self-pay | Admitting: *Deleted

## 2015-07-06 DIAGNOSIS — M79606 Pain in leg, unspecified: Secondary | ICD-10-CM | POA: Diagnosis not present

## 2015-07-06 DIAGNOSIS — F259 Schizoaffective disorder, unspecified: Secondary | ICD-10-CM | POA: Diagnosis not present

## 2015-07-06 DIAGNOSIS — F17210 Nicotine dependence, cigarettes, uncomplicated: Secondary | ICD-10-CM | POA: Insufficient documentation

## 2015-07-06 DIAGNOSIS — Z8673 Personal history of transient ischemic attack (TIA), and cerebral infarction without residual deficits: Secondary | ICD-10-CM | POA: Diagnosis not present

## 2015-07-06 DIAGNOSIS — M542 Cervicalgia: Secondary | ICD-10-CM | POA: Insufficient documentation

## 2015-07-06 DIAGNOSIS — T7421XA Adult sexual abuse, confirmed, initial encounter: Secondary | ICD-10-CM | POA: Diagnosis present

## 2015-07-06 DIAGNOSIS — IMO0002 Reserved for concepts with insufficient information to code with codable children: Secondary | ICD-10-CM

## 2015-07-06 DIAGNOSIS — G809 Cerebral palsy, unspecified: Secondary | ICD-10-CM | POA: Insufficient documentation

## 2015-07-06 LAB — HIV ANTIBODY (ROUTINE TESTING W REFLEX): HIV SCREEN 4TH GENERATION: NONREACTIVE

## 2015-07-06 LAB — RPR: RPR: NONREACTIVE

## 2015-07-06 MED ORDER — IBUPROFEN 400 MG PO TABS: 400.0000 mg | ORAL_TABLET | Freq: Three times a day (TID) | ORAL | Status: DC

## 2015-07-06 MED ORDER — METHOCARBAMOL 500 MG PO TABS: 500.0000 mg | ORAL_TABLET | Freq: Two times a day (BID) | ORAL | Status: DC | PRN

## 2015-07-06 MED ORDER — KETOROLAC TROMETHAMINE 30 MG/ML IJ SOLN
30.0000 mg | Freq: Once | INTRAMUSCULAR | Status: AC
  Administered 2015-07-06: 30 mg via INTRAVENOUS
  Filled 2015-07-06: qty 1

## 2015-07-06 MED ORDER — ONDANSETRON HCL 4 MG/2ML IJ SOLN
4.0000 mg | Freq: Once | INTRAMUSCULAR | Status: AC
  Administered 2015-07-06: 4 mg via INTRAVENOUS
  Filled 2015-07-06: qty 2

## 2015-07-06 MED ORDER — SODIUM CHLORIDE 0.9 % IV BOLUS (SEPSIS)
1000.0000 mL | Freq: Once | INTRAVENOUS | Status: AC
  Administered 2015-07-06: 1000 mL via INTRAVENOUS

## 2015-07-06 MED ORDER — LORAZEPAM 2 MG/ML IJ SOLN
1.0000 mg | Freq: Once | INTRAMUSCULAR | Status: AC
  Administered 2015-07-06: 1 mg via INTRAVENOUS
  Filled 2015-07-06: qty 1

## 2015-07-06 NOTE — ED Notes (Signed)
PA at bedside.
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
## 2015-07-06 NOTE — Progress Notes (Addendum)
Pt seen by ED AM CM on 07/05/15 and provided f/u appt and instructions for rx and future testing  No further CM interventions needed  This is pt's 2nd [facility] visit in 2 days, third ED visit in 6 months with [facility]
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
## 2015-07-07 ENCOUNTER — Ambulatory Visit (HOSPITAL_COMMUNITY)
Admission: EM | Admit: 2015-07-07 | Discharge: 2015-07-07 | Disposition: A | Discharge status: Home / Self Care | Payer: No Typology Code available for payment source | Attending: Emergency Medicine | Admitting: Emergency Medicine

## 2015-07-07 DIAGNOSIS — Z0441 Encounter for examination and observation following alleged adult rape: Secondary | ICD-10-CM | POA: Insufficient documentation

## 2015-07-07 MED ORDER — ONDANSETRON 4 MG PO TBDP: 4.0000 mg | ORAL_TABLET | Freq: Three times a day (TID) | ORAL | Status: DC | PRN
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
## 2015-09-09 ENCOUNTER — Emergency Department (HOSPITAL_COMMUNITY)
Admission: EM | Admit: 2015-09-09 | Discharge: 2015-09-09 | Disposition: A | Discharge status: Home / Self Care | Payer: Medicare Other | Attending: Emergency Medicine | Admitting: Emergency Medicine

## 2015-09-09 ENCOUNTER — Encounter (HOSPITAL_COMMUNITY): Payer: Self-pay | Admitting: Emergency Medicine

## 2015-09-09 DIAGNOSIS — F1290 Cannabis use, unspecified, uncomplicated: Secondary | ICD-10-CM | POA: Diagnosis not present

## 2015-09-09 DIAGNOSIS — Z79899 Other long term (current) drug therapy: Secondary | ICD-10-CM | POA: Insufficient documentation

## 2015-09-09 DIAGNOSIS — F1490 Cocaine use, unspecified, uncomplicated: Secondary | ICD-10-CM | POA: Diagnosis not present

## 2015-09-09 DIAGNOSIS — G40909 Epilepsy, unspecified, not intractable, without status epilepticus: Secondary | ICD-10-CM | POA: Diagnosis not present

## 2015-09-09 DIAGNOSIS — F17210 Nicotine dependence, cigarettes, uncomplicated: Secondary | ICD-10-CM | POA: Diagnosis not present

## 2015-09-09 DIAGNOSIS — Z791 Long term (current) use of non-steroidal anti-inflammatories (NSAID): Secondary | ICD-10-CM | POA: Insufficient documentation

## 2015-09-09 DIAGNOSIS — Z8673 Personal history of transient ischemic attack (TIA), and cerebral infarction without residual deficits: Secondary | ICD-10-CM | POA: Insufficient documentation

## 2015-09-09 DIAGNOSIS — F259 Schizoaffective disorder, unspecified: Secondary | ICD-10-CM | POA: Diagnosis not present

## 2015-09-09 DIAGNOSIS — R569 Unspecified convulsions: Secondary | ICD-10-CM

## 2015-09-09 LAB — URINALYSIS, ROUTINE W REFLEX MICROSCOPIC
BILIRUBIN URINE: NEGATIVE
Glucose, UA: NEGATIVE mg/dL
HGB URINE DIPSTICK: NEGATIVE
Ketones, ur: NEGATIVE mg/dL
Leukocytes, UA: NEGATIVE
Nitrite: NEGATIVE
PH: 6 (ref 5.0–8.0)
Protein, ur: NEGATIVE mg/dL
SPECIFIC GRAVITY, URINE: 1.005 (ref 1.005–1.030)

## 2015-09-09 LAB — CBC WITH DIFFERENTIAL/PLATELET
Basophils Absolute: 0 10*3/uL (ref 0.0–0.1)
Basophils Relative: 0 %
EOS ABS: 0 10*3/uL (ref 0.0–0.7)
Eosinophils Relative: 0 %
HCT: 34.2 % — ABNORMAL LOW (ref 36.0–46.0)
HEMOGLOBIN: 11.7 g/dL — AB (ref 12.0–15.0)
LYMPHS ABS: 1 10*3/uL (ref 0.7–4.0)
Lymphocytes Relative: 17 %
MCH: 33.3 pg (ref 26.0–34.0)
MCHC: 34.2 g/dL (ref 30.0–36.0)
MCV: 97.4 fL (ref 78.0–100.0)
MONO ABS: 0.3 10*3/uL (ref 0.1–1.0)
MONOS PCT: 6 %
Neutro Abs: 4.3 10*3/uL (ref 1.7–7.7)
Neutrophils Relative %: 77 %
Platelets: 188 10*3/uL (ref 150–400)
RBC: 3.51 MIL/uL — ABNORMAL LOW (ref 3.87–5.11)
RDW: 13.2 % (ref 11.5–15.5)
WBC: 5.6 10*3/uL (ref 4.0–10.5)

## 2015-09-09 LAB — RAPID URINE DRUG SCREEN, HOSP PERFORMED
AMPHETAMINES: NOT DETECTED
BENZODIAZEPINES: NOT DETECTED
Barbiturates: NOT DETECTED
COCAINE: NOT DETECTED
OPIATES: NOT DETECTED
TETRAHYDROCANNABINOL: NOT DETECTED

## 2015-09-09 LAB — CBG MONITORING, ED: Glucose-Capillary: 62 mg/dL — ABNORMAL LOW (ref 65–99)

## 2015-09-09 LAB — BASIC METABOLIC PANEL
Anion gap: 14 (ref 5–15)
BUN: 8 mg/dL (ref 6–20)
CHLORIDE: 101 mmol/L (ref 101–111)
CO2: 18 mmol/L — AB (ref 22–32)
CREATININE: 0.65 mg/dL (ref 0.44–1.00)
Calcium: 8.9 mg/dL (ref 8.9–10.3)
GFR calc Af Amer: >60 mL/min (ref >60)
GFR calc non Af Amer: >60 mL/min (ref >60)
Glucose, Bld: 75 mg/dL (ref 65–99)
Potassium: 4.4 mmol/L (ref 3.5–5.1)
SODIUM: 133 mmol/L — AB (ref 135–145)

## 2015-09-09 LAB — ETHANOL: ALCOHOL ETHYL (B): 37 mg/dL — AB (ref <5)

## 2015-09-09 LAB — I-STAT BETA HCG BLOOD, ED (MC, WL, AP ONLY)

## 2015-09-09 MED ORDER — LORAZEPAM 2 MG/ML IJ SOLN
1.0000 mg | Freq: Once | INTRAMUSCULAR | Status: AC
  Administered 2015-09-09: 1 mg via INTRAVENOUS
  Filled 2015-09-09: qty 1

## 2015-09-09 MED ORDER — SODIUM CHLORIDE 0.9 % IV SOLN
Freq: Once | INTRAVENOUS | Status: AC
  Administered 2015-09-09: 20:00:00 via INTRAVENOUS
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
## 2015-09-09 NOTE — ED Notes (Signed)
Bed: WA06 Expected date: 09/09/15 Expected time: 6:56 PM Means of arrival: Ambulance Comments: Seizure

## 2015-09-09 NOTE — ED Provider Notes (Signed)
CSN: [id number]     Arrival date & time 09/09/15  1857 History   First MD Initiated Contact with Patient 09/09/15 1913     Chief Complaint  Patient presents with  . Seizures    (Consider location/radiation/quality/duration/timing/severity/associated sxs/prior Treatment) Patient is a 30 y.o. female presenting with seizures. The history is provided by the patient and medical records. No language interpreter was used.  Seizures    Sylvia [name] is a 30 y.o. female  with a PMH of seizures, hep C, CP who presents to the Emergency Department via EMS after likely seizure just prior to arrival. Patient states she had an "alcoholic seizure" -unsure of how long seizure lasted. Last seizure approximately 2 months ago which was a similar presentation. She drank 5 beers today, denies drug use. Typically drinks 10-12 beers daily. She states she was walking around outside when she "fell out" and the next thing she remembers is EMS helping her. Denies known witness to seizure activity. Denies incontinence. Patient states that she takes gabapentin for her seizures and believes that she has taken them as directed, however unsure of what dosage and when she takes her medication. She states she was placed on Depakote a few years ago, but they took her off of that when she was in a detox facility because of "what it did to my blood pressure." Admits to sore tongue 2/2 biting it during seizure. Denies incontinence, fever, recent illness, chest pain, difficulty breathing, abdominal pain, back pain.   Past Medical History  Diagnosis Date  . CP (cerebral palsy) (HCC)   . Hepatitis C   . Herpes   . Stroke (HCC)   . Seizures (HCC)   . Schizoaffective schizophrenia (HCC) 09/17/2013    Per notes from [facility] as on 2014    Past Surgical History  Procedure Laterality Date  . Leg surgery Right     x7  . Amputation Right 09/17/2013    Procedure: PARTIAL AMPUTATION 2ND TOE RIGHT FOOT;  Surgeon: Dallas Schimke, DPM;   Location: [facility];  Service: Podiatry;  Laterality: Right;  . Foot surgery      rt and left   Family History  Problem Relation Age of Onset  . Bipolar disorder Mother   . Cancer Maternal Grandmother   . Diabetes Maternal Grandfather   . Hypertension Maternal Grandfather    Social History  Substance Use Topics  . Smoking status: Current Every Day Smoker -- 0.50 packs/day for 20 years    Types: Cigarettes  . Smokeless tobacco: Former Neurosurgeon  . Alcohol Use: 1.8 oz/week    3 Cans of beer per week     Comment: Per patient 0.5 gallon to gallon when she can but since being in group home she can't do it as often    OB History    Gravida Para Term Preterm AB TAB SAB Ectopic Multiple Living   2 1 1  1  1   1      Review of Systems  Constitutional: Negative for fever and fatigue.  HENT: Negative for congestion.   Eyes: Negative for pain.  Respiratory: Negative for cough and shortness of breath.   Cardiovascular: Negative.   Gastrointestinal: Negative for nausea, vomiting and abdominal pain.  Genitourinary: Negative for dysuria.  Musculoskeletal: Negative for myalgias and back pain.  Skin: Positive for wound (Bite of tongue).  Neurological: Positive for seizures. Negative for speech difficulty, weakness and headaches.      Allergies  Diphenhydramine hcl; Fluoxetine; Depakote;  and Quetiapine  Home Medications   Prior to Admission medications   Medication Sig Start Date End Date Taking? Authorizing Provider  benztropine (COGENTIN) 0.5 MG tablet Take 0.5 mg by mouth at bedtime.   Yes Historical Provider, MD  etonogestrel (NEXPLANON) 68 MG IMPL implant 1 each by Subdermal route once.   Yes Historical Provider, MD  gabapentin (NEURONTIN) 300 MG capsule Take 300 mg by mouth 2 (two) times daily.   Yes Historical Provider, MD  gabapentin (NEURONTIN) 400 MG capsule Take 400 mg by mouth at bedtime.   Yes Historical Provider, MD  hydrOXYzine (ATARAX/VISTARIL) 50 MG tablet Take 50 mg by mouth  3 (three) times daily.   Yes Historical Provider, MD  ondansetron (ZOFRAN ODT) 4 MG disintegrating tablet Take 1 tablet (4 mg total) by mouth every 8 (eight) hours as needed for nausea or vomiting. 07/07/15  Yes Earley Favor, NP  promethazine (PHENERGAN) 25 MG tablet Take 25 mg by mouth every 6 (six) hours as needed for nausea or vomiting.   Yes Historical Provider, MD  dolutegravir (TIVICAY) 50 MG tablet Take 1 tablet (50 mg total) by mouth daily. Patient not taking: Reported on 07/06/2015 07/05/15   Dione Booze, MD  emtricitabine-tenofovir (TRUVADA) 200-300 MG tablet Take 1 tablet by mouth daily. Patient not taking: Reported on 07/06/2015 07/05/15   Dione Booze, MD  ibuprofen (ADVIL,MOTRIN) 400 MG tablet Take 1 tablet (400 mg total) by mouth 3 (three) times daily. Patient not taking: Reported on 09/09/2015 07/06/15   Danelle Berry, PA-C  methocarbamol (ROBAXIN) 500 MG tablet Take 1 tablet (500 mg total) by mouth 2 (two) times daily as needed for muscle spasms. Patient not taking: Reported on 09/09/2015 07/06/15   Danelle Berry, PA-C  nicotine polacrilex (NICORETTE) 2 MG gum Take 1 each (2 mg total) by mouth as needed for smoking cessation. Patient not taking: Reported on 07/06/2015 03/24/15   [name], NP  risperiDONE (RISPERDAL) 3 MG tablet Take 2 tablets (6 mg total) by mouth at bedtime. Patient not taking: Reported on 09/09/2015 03/24/15   [name], NP   BP 110/83 mmHg  Pulse 87  Temp(Src) 98.9 F (37.2 C) (Oral)  Resp 19  Ht 5\' 4"  (1.626 m)  Wt 60.782 kg  BMI 22.99 kg/m2  SpO2 99% Physical Exam  Constitutional: She is oriented to person, place, and time. She appears well-developed and well-nourished. No distress.  HENT:  Head: Normocephalic and atraumatic.  Outer aspects of tongue with small abrasions 2/2 tongue biting.   Neck:  No midline or paraspinal tenderness. Full range of motion without pain.  Cardiovascular: Normal rate, regular rhythm, normal heart sounds and intact distal  pulses.  Exam reveals no gallop and no friction rub.   No murmur heard. Pulmonary/Chest: Effort normal and breath sounds normal. No respiratory distress. She has no wheezes. She has no rales. She exhibits no tenderness.  Abdominal: Soft. Bowel sounds are normal. She exhibits no distension. There is no tenderness.  Musculoskeletal: She exhibits no edema.  Neurological: She is alert and oriented to person, place, and time.  Alert, oriented, thought content appropriate, able to follow commands.  Cranial Nerves:  II:  Peripheral visual fields grossly normal, pupils equal, round, reactive to light III, IV, VI: EOM intact bilaterally, ptosis not present V,VII: smile symmetric, eyes kept closed tightly against resistance, facial light touch sensation equal VIII: hearing grossly normal IX, X: symmetric soft palate movement, uvula elevates symmetrically  XI: bilateral shoulder shrug symmetric and strong XII: midline  tongue extension 5/5 muscle strength in upper and lower extremities bilaterally including strong and equal grip strength and dorsiflexion/plantar flexion Sensory to light touch normal in all four extremities.  Normal finger-to-nose and rapid alternating movements.   Skin: Skin is warm and dry.  Nursing note and vitals reviewed.   ED Course  Procedures (including critical care time) Labs Review Labs Reviewed  ETHANOL - Abnormal; Notable for the following:    Alcohol, Ethyl (B) 37 (*)    All other components within normal limits  CBC WITH DIFFERENTIAL/PLATELET - Abnormal; Notable for the following:    RBC 3.51 (*)    Hemoglobin 11.7 (*)    HCT 34.2 (*)    All other components within normal limits  BASIC METABOLIC PANEL - Abnormal; Notable for the following:    Sodium 133 (*)    CO2 18 (*)    All other components within normal limits  CBG MONITORING, ED - Abnormal; Notable for the following:    Glucose-Capillary 62 (*)    All other components within normal limits  URINALYSIS,  ROUTINE W REFLEX MICROSCOPIC (NOT AT [facility])  URINE RAPID DRUG SCREEN, HOSP PERFORMED  I-STAT BETA HCG BLOOD, ED ([facility], [facility], [facility] ONLY)    Imaging Review No results found. I have personally reviewed and evaluated these images and lab results as part of my medical decision-making.   EKG Interpretation None      MDM   Final diagnoses:  Seizures (HCC)   Sylvia [name] is a 30 y.o. female with hx of seizure disorder who presents to ED after seizure just prior to arrival. She states that she takes gabapentin for her seizures. She endorses taking medication regularly, however when asked for specifics, she is unable to give dosage and when/how she takes these medications. On exam, she is alert and oriented x 3. GCS of 15 with no focal neuro deficits. Ativan given upon arrival. All labs reviewed. Patient with no further seizure activity while in ED. Ambulated with steady gait with no difficulty. Negative romberg and no pronator drift at re-evaluation. Patient requesting discharge to home. Evaluation does not show pathology that would require ongoing emergent intervention or inpatient treatment. Patient is hemodynamically stable and mentating appropriately. A significant amount of time was taken to discuss the importance of medication compliance as well as following up with her PCP at the next available appointment. PCP info included on discharge summary. Reasons to return to ED were discussed and all questions answered.   Patient discussed with Dr. Criss Alvine who agrees with treatment plan.   [facility] Geofrey Silliman, PA-C 09/09/15 2320   Pricilla Loveless, MD 09/14/15 [phone]

## 2015-09-09 NOTE — ED Notes (Addendum)
Pt. Ambulated to bathroom without assistance. Pt. Gait steady on her feet.

## 2015-09-09 NOTE — Discharge Instructions (Signed)
Please call your primary care physician first thing in the morning to schedule a follow up appointment at their next available opening. Bring your papers from today's visit to your appointment. Let your primary care physician know that you had a seizure and discuss today's events. I would like him/her to go over your seizure medications with you as well. Your primary doctor may want you to see the neurologist. Return to ER for new or worsening symptoms, any additional concerns.

## 2016-02-03 ENCOUNTER — Emergency Department
Admission: EM | Admit: 2016-02-03 | Discharge: 2016-02-03 | Disposition: A | Discharge status: Home / Self Care | Payer: Medicare Other | Attending: Emergency Medicine | Admitting: Emergency Medicine

## 2016-02-03 ENCOUNTER — Encounter: Payer: Self-pay | Admitting: Emergency Medicine

## 2016-02-03 DIAGNOSIS — F250 Schizoaffective disorder, bipolar type: Secondary | ICD-10-CM | POA: Diagnosis not present

## 2016-02-03 DIAGNOSIS — F1490 Cocaine use, unspecified, uncomplicated: Secondary | ICD-10-CM | POA: Diagnosis not present

## 2016-02-03 DIAGNOSIS — F1290 Cannabis use, unspecified, uncomplicated: Secondary | ICD-10-CM | POA: Insufficient documentation

## 2016-02-03 DIAGNOSIS — G809 Cerebral palsy, unspecified: Secondary | ICD-10-CM | POA: Insufficient documentation

## 2016-02-03 DIAGNOSIS — F10120 Alcohol abuse with intoxication, uncomplicated: Secondary | ICD-10-CM | POA: Diagnosis not present

## 2016-02-03 DIAGNOSIS — F10920 Alcohol use, unspecified with intoxication, uncomplicated: Secondary | ICD-10-CM

## 2016-02-03 DIAGNOSIS — F17210 Nicotine dependence, cigarettes, uncomplicated: Secondary | ICD-10-CM | POA: Diagnosis not present

## 2016-02-03 DIAGNOSIS — Z79899 Other long term (current) drug therapy: Secondary | ICD-10-CM | POA: Insufficient documentation

## 2016-02-03 DIAGNOSIS — Z046 Encounter for general psychiatric examination, requested by authority: Secondary | ICD-10-CM | POA: Diagnosis present

## 2016-02-03 LAB — COMPREHENSIVE METABOLIC PANEL
ALBUMIN: 4.5 g/dL (ref 3.5–5.0)
ALK PHOS: 80 U/L (ref 38–126)
ALT: 68 U/L — AB (ref 14–54)
ANION GAP: 11 (ref 5–15)
AST: 76 U/L — ABNORMAL HIGH (ref 15–41)
BILIRUBIN TOTAL: 0.9 mg/dL (ref 0.3–1.2)
BUN: 6 mg/dL (ref 6–20)
CALCIUM: 9.1 mg/dL (ref 8.9–10.3)
CO2: 19 mmol/L — AB (ref 22–32)
CREATININE: 0.67 mg/dL (ref 0.44–1.00)
Chloride: 103 mmol/L (ref 101–111)
GFR calc non Af Amer: >60 mL/min (ref >60)
GLUCOSE: 99 mg/dL (ref 65–99)
Potassium: 3.7 mmol/L (ref 3.5–5.1)
Sodium: 133 mmol/L — ABNORMAL LOW (ref 135–145)
TOTAL PROTEIN: 8.5 g/dL — AB (ref 6.5–8.1)

## 2016-02-03 LAB — CBC
HEMATOCRIT: 36.6 % (ref 35.0–47.0)
HEMOGLOBIN: 13 g/dL (ref 12.0–16.0)
MCH: 33.9 pg (ref 26.0–34.0)
MCHC: 35.6 g/dL (ref 32.0–36.0)
MCV: 95.4 fL (ref 80.0–100.0)
Platelets: 219 10*3/uL (ref 150–440)
RBC: 3.84 MIL/uL (ref 3.80–5.20)
RDW: 12.5 % (ref 11.5–14.5)
WBC: 7.5 10*3/uL (ref 3.6–11.0)

## 2016-02-03 LAB — SALICYLATE LEVEL: Salicylate Lvl: <7 mg/dL (ref 2.8–30.0)

## 2016-02-03 LAB — ETHANOL: Alcohol, Ethyl (B): 267 mg/dL — ABNORMAL HIGH (ref <5)

## 2016-02-03 LAB — ACETAMINOPHEN LEVEL

## 2016-02-03 MED ORDER — ZIPRASIDONE MESYLATE 20 MG IM SOLR
INTRAMUSCULAR | Status: AC
  Administered 2016-02-03: 10 mg via INTRAMUSCULAR
  Filled 2016-02-03: qty 20

## 2016-02-03 MED ORDER — ZIPRASIDONE MESYLATE 20 MG IM SOLR
10.0000 mg | Freq: Once | INTRAMUSCULAR | Status: AC
  Administered 2016-02-03: 10 mg via INTRAMUSCULAR

## 2016-02-03 NOTE — ED Provider Notes (Signed)
[facility] Emergency Department Provider Note   ____________________________________________   First MD Initiated Contact with Patient 02/03/16 0602     (approximate)  I have reviewed the triage vital signs and the nursing notes.   HISTORY  Chief Complaint Manic Behavior and Medical Clearance  Limited by intoxication  HPI Sylvia [name] is a 30 y.o. female brought to the ED by police under IVC for mania and threats of self-harm. Reportedly patient was drinking alcohol, smoking marijuana and using ecstasy. Neighbor called police for patient threatening to harm herself. Arrives quite agitated, screaming with pressured speech. Rest of history is unobtainable secondary to behavioral disturbance.   Past Medical History:  Diagnosis Date  . CP (cerebral palsy) (HCC)   . Hepatitis C   . Herpes   . Schizoaffective schizophrenia (HCC) 09/17/2013   Per notes from [facility] as on 2014   . Seizures (HCC)   . Stroke [facility])     Patient Active Problem List   Diagnosis Date Noted  . 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 Cocaine use disorder, moderate, dependence (HCC) 03/17/2015  . Cannabis use disorder, mild, abuse 03/17/2015  . Alcohol use disorder, severe, dependence (HCC) 03/17/2015  . Schizoaffective disorder, bipolar type (HCC) 03/16/2015  . Seizure disorder (HCC) 09/17/2013  . Cellulitis and abscess of toe of right foot 09/16/2013  . Hepatitis C   . CP (cerebral palsy) (HCC)     Past Surgical History:  Procedure Laterality Date  . AMPUTATION Right 09/17/2013   Procedure: PARTIAL AMPUTATION 2ND TOE RIGHT FOOT;  Surgeon: Dallas Schimke, DPM;  Location: [facility];  Service: Podiatry;  Laterality: Right;  . FOOT SURGERY     rt and left  . LEG SURGERY Right    x7    Prior to Admission medications   Medication Sig Start Date End Date Taking? Authorizing Provider  benztropine (COGENTIN) 0.5 MG tablet Take 0.5 mg by mouth at bedtime.     Historical Provider, MD  dolutegravir (TIVICAY) 50 MG tablet Take 1 tablet (50 mg total) by mouth daily. Patient not taking: Reported on 07/06/2015 07/05/15   Dione Booze, MD  emtricitabine-tenofovir (TRUVADA) 200-300 MG tablet Take 1 tablet by mouth daily. Patient not taking: Reported on 07/06/2015 07/05/15   Dione Booze, MD  etonogestrel (NEXPLANON) 68 MG IMPL implant 1 each by Subdermal route once.    Historical Provider, MD  gabapentin (NEURONTIN) 300 MG capsule Take 300 mg by mouth 2 (two) times daily.    Historical Provider, MD  gabapentin (NEURONTIN) 400 MG capsule Take 400 mg by mouth at bedtime.    Historical Provider, MD  hydrOXYzine (ATARAX/VISTARIL) 50 MG tablet Take 50 mg by mouth 3 (three) times daily.    Historical Provider, MD  ibuprofen (ADVIL,MOTRIN) 400 MG tablet Take 1 tablet (400 mg total) by mouth 3 (three) times daily. Patient not taking: Reported on 09/09/2015 07/06/15   Danelle Berry, PA-C  methocarbamol (ROBAXIN) 500 MG tablet Take 1 tablet (500 mg total) by mouth 2 (two) times daily as needed for muscle spasms. Patient not taking: Reported on 09/09/2015 07/06/15   Danelle Berry, PA-C  nicotine polacrilex (NICORETTE) 2 MG gum Take 1 each (2 mg total) by mouth as needed for smoking cessation. Patient not taking: Reported on 07/06/2015 03/24/15   [name], NP  ondansetron (ZOFRAN ODT) 4 MG disintegrating tablet Take 1 tablet (4 mg total) by mouth every 8 (eight) hours as needed for nausea or vomiting. 07/07/15   Dondra Spry  Manus RuddSchulz, NP  promethazine (PHENERGAN) 25 MG tablet Take 25 mg by mouth every 6 (six) hours as needed for nausea or vomiting.    Historical Provider, MD  risperiDONE (RISPERDAL) 3 MG tablet Take 2 tablets (6 mg total) by mouth at bedtime. Patient not taking: Reported on 09/09/2015 03/24/15   Adonis BrookSheila Agustin, NP    Allergies Diphenhydramine hcl; Fluoxetine; Depakote [divalproex sodium]; and Quetiapine  Family History  Problem Relation Age of Onset  . Bipolar disorder  Mother   . Cancer Maternal Grandmother   . Diabetes Maternal Grandfather   . Hypertension Maternal Grandfather     Social History Social History  Substance Use Topics  . Smoking status: Current Every Day Smoker    Packs/day: 0.50    Years: 20.00    Types: Cigarettes  . Smokeless tobacco: Former NeurosurgeonUser  . Alcohol use 1.8 oz/week    3 Cans of beer per week     Comment: Per patient 0.5 gallon to gallon when she can but since being in group home she can't do it as often     Review of Systems  Constitutional: No fever/chills. Eyes: No visual changes. ENT: No sore throat. Cardiovascular: Denies chest pain. Respiratory: Denies shortness of breath. Gastrointestinal: No abdominal pain.  No nausea, no vomiting.  No diarrhea.  No constipation. Genitourinary: Negative for dysuria. Musculoskeletal: Negative for back pain. Skin: Negative for rash. Neurological: Negative for headaches, focal weakness or numbness. Psychiatric:Positive for mania and SI.  Limited by intoxication; otherwise 10-point ROS otherwise negative.  ____________________________________________   PHYSICAL EXAM:  VITAL SIGNS: ED Triage Vitals  Enc Vitals Group     BP 02/03/16 0457 92/65     Pulse Rate 02/03/16 0457 (!) 127     Resp 02/03/16 0457 (!) 22     Temp 02/03/16 0457 97.7 F (36.5 C)     Temp Source 02/03/16 0457 Oral     SpO2 02/03/16 0457 97 %     Weight 02/03/16 0458 146 lb (66.2 kg)     Height 02/03/16 0458 5\' 4"  (1.626 m)     Head Circumference --      Peak Flow --      Pain Score 02/03/16 0458 0     Pain Loc --      Pain Edu? --      Excl. in GC? --     Constitutional: Alert and oriented. Disheveled appearing and in moderate acute distress. Screaming, laughing inappropriately at times. Eyes: Conjunctivae are normal. PERRL. EOMI. Head: Atraumatic. Nose: No congestion/rhinnorhea. Mouth/Throat: Mucous membranes are moist.  Oropharynx non-erythematous. Neck: No stridor.  No cervical spine  tenderness to palpation. Cardiovascular: Tachycardic rate, regular rhythm. Grossly normal heart sounds.  Good peripheral circulation. Respiratory: Normal respiratory effort.  No retractions. Lungs CTAB. Gastrointestinal: Soft and nontender. No distention. No abdominal bruits. No CVA tenderness. Musculoskeletal: No lower extremity tenderness nor edema.  No joint effusions. Neurologic:  Normal speech and language. No gross focal neurologic deficits are appreciated. MAEx4.  Skin:  Skin is warm, dry and intact. No rash noted. Psychiatric: Mood and affect are manic. Speech and behavior are loud and boisterous  ____________________________________________   LABS (all labs ordered are listed, but only abnormal results are displayed)  Labs Reviewed  COMPREHENSIVE METABOLIC PANEL - Abnormal; Notable for the following:       Result Value   Sodium 133 (*)    CO2 19 (*)    Total Protein 8.5 (*)    AST 76 (*)  ALT 68 (*)    All other components within normal limits  ETHANOL - Abnormal; Notable for the following:    Alcohol, Ethyl (B) 267 (*)    All other components within normal limits  ACETAMINOPHEN LEVEL - Abnormal; Notable for the following:    Acetaminophen (Tylenol), Serum <10 (*)    All other components within normal limits  CBC  SALICYLATE LEVEL  URINE DRUG SCREEN, QUALITATIVE ([facility] ONLY)   ____________________________________________  EKG  None ____________________________________________  RADIOLOGY  None ____________________________________________   PROCEDURES  Procedure(s) performed: None  Procedures  Critical Care performed: No  ____________________________________________   INITIAL IMPRESSION / ASSESSMENT AND PLAN / ED COURSE  Pertinent labs & imaging results that were available during my care of the patient were reviewed by me and considered in my medical decision making (see chart for details).  30 year old female with a history of bipolar type  schizoaffective disorder and polysubstance abuse presents under IVC for threats of self-harm. She is screaming, manic and poses a threat to herself as well as staff. IM calming medicine given. Will obtain screening lab work and maintain patient under IVC pending TTS and psychiatry consults.  Clinical Course as of Feb 03 612  Sat Feb 03, 2016  [id number] Patient is sleeping in no acute distress. [facility] once patient is awake and able to participate in an interview.  [JS]    Clinical Course User Index [JS] Irean HongJade J Tavoris Brisk, MD     ____________________________________________   FINAL CLINICAL IMPRESSION(S) / ED DIAGNOSES  Final diagnoses:  Alcoholic intoxication without complication (HCC)  Schizoaffective disorder, bipolar type (HCC)      NEW MEDICATIONS STARTED DURING THIS VISIT:  New Prescriptions   No medications on file     Note:  This document was prepared using Dragon voice recognition software and may include unintentional dictation errors.    Irean HongJade J Nithin Demeo, MD 02/03/16 [phone]

## 2016-02-03 NOTE — ED Notes (Signed)
BEHAVIORAL HEALTH ROUNDING Patient sleeping: No. Patient alert and oriented: yes Behavior appropriate: Yes.  ; If no, describe:  Nutrition and fluids offered: Yes  Toileting and hygiene offered: Yes  Sitter present: q 15 min checks Law enforcement present: Yes   TTS in room to assess patient at this time.  Patient also given meal tray.

## 2016-02-03 NOTE — ED Notes (Signed)
BEHAVIORAL HEALTH ROUNDING Patient sleeping: Yes.   Patient alert and oriented: sleeping Behavior appropriate: Yes.  ; If no, describe:  Nutrition and fluids offered: Yes  Toileting and hygiene offered: Yes  Sitter present: q 15 min checks Law enforcement present: Yes  

## 2016-02-03 NOTE — BH Assessment (Signed)
Writer was unable to complete TTS Consult at this time. Patient was sleep and difficult to arouse.

## 2016-02-03 NOTE — ED Notes (Signed)
BEHAVIORAL HEALTH ROUNDING Patient sleeping: No. Patient alert and oriented: yes Behavior appropriate: Yes.  ; If no, describe:  Nutrition and fluids offered: Yes  Toileting and hygiene offered: Yes  Sitter present: q 15 min checks Law enforcement present: Yes   [facility].

## 2016-02-03 NOTE — ED Triage Notes (Signed)
Pt brought to ED by [employer] officer Margo AyeHall and Karna DupesGrogan with IVC papers.Pt has hx of manic depressive disorder. Pt talking very loudly at times and is rambling in her conversation.
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
## 2016-02-03 NOTE — Discharge Instructions (Signed)
Please follow up with PCP.  Follow up with ACTT.  Return for any worsening symptoms, concerns or questions.

## 2016-02-03 NOTE — ED Notes (Signed)
Pt. Verbalizes understanding of d/c instructions and follow-up. VS stable and pain controlled per pt.  Pt. In NAD at time of d/c and denies further concerns regarding this visit. Pt. Stable at the time of departure from the unit, departing unit by the safest and most appropriate manner per that pt condition and limitations. Pt advised to return to the ED at any time for emergent concerns, or for new/worsening symptoms. Pt. States she is just ready to go

## 2016-02-03 NOTE — ED Provider Notes (Signed)
[facility]  Has been observed in the ER overnight and demonstrating intact mental status with no SI or HI. Patient well-known to ACT treatment team who also feel patient is stable for discharge home. Patient encouraged to return to the ER should she have any worsening symptoms.   Willy EddyPatrick Aiyla Baucom, MD 02/03/16 [phone]

## 2016-06-26 ENCOUNTER — Ambulatory Visit (INDEPENDENT_AMBULATORY_CARE_PROVIDER_SITE_OTHER): Payer: Medicare Other | Admitting: Obstetrics & Gynecology

## 2016-06-26 VITALS — BP 133/85 | HR 100 | Ht 64.0 in | Wt 153.6 lb

## 2016-06-26 DIAGNOSIS — Z0000 Encounter for general adult medical examination without abnormal findings: Secondary | ICD-10-CM

## 2016-06-26 DIAGNOSIS — Z01419 Encounter for gynecological examination (general) (routine) without abnormal findings: Secondary | ICD-10-CM | POA: Diagnosis present

## 2016-06-26 MED ORDER — VALACYCLOVIR HCL 1 G PO TABS: 1000.0000 mg | ORAL_TABLET | Freq: Every day | ORAL | 6 refills | Status: DC

## 2016-06-26 NOTE — Progress Notes (Signed)
   Subjective:    Patient ID: Sylvia [name] [place], female    DOB: 11-02-1985, 31 y.o.   MRN: [id number]  HPI  30yo S W 67P1 (5 yo daughter) here today to get a refill of valtrex. She has not taken it for 6 years.   Review of Systems She has been abstinent since 10/17. She had Nexplanon placed about a year ago. She does not have periods. This is her second Nexplanon.  [place] was last year and was [place]   She has Hep C and can't get treated until she stops drinking. She is not interested in going to AA. Objective:   Physical Exam [id number] Odd affect (however, she reports that her only calories today have been from wine and beer) Abd- benign    Assessment & Plan:  h/o hsv- I have refilled her valtrex. She said that she would like her LFTs and kidney function tested. This request is from her ACT team. Referral to primary care

## 2016-06-26 NOTE — Progress Notes (Signed)
Pt states that she [place] pap last [place]. States that she is here today for some labs and herpes treatment.

## 2016-06-26 NOTE — Addendum Note (Signed)
Addended by: Sherre LainASH, AMANDA A on: 06/26/2016 03:24 PM   Modules accepted: Orders

## 2016-06-27 LAB — CBC
Hematocrit: 35.9 % (ref 34.0–46.6)
Hemoglobin: 12.2 g/dL (ref 11.1–15.9)
MCH: 32.6 pg (ref 26.6–33.0)
MCHC: 34 g/dL (ref 31.5–35.7)
MCV: 96 fL (ref 79–97)
PLATELETS: 165 10*3/uL (ref 150–379)
RBC: 3.74 x10E6/uL — ABNORMAL LOW (ref 3.77–5.28)
RDW: 13.6 % (ref 12.3–15.4)
WBC: 3.8 10*3/uL (ref 3.4–10.8)

## 2016-06-27 LAB — COMPREHENSIVE METABOLIC PANEL
A/G RATIO: 1.5 (ref 1.2–2.2)
ALT: 82 IU/L — AB (ref 0–32)
AST: 74 IU/L — AB (ref 0–40)
Albumin: 4.4 g/dL (ref 3.5–5.5)
Alkaline Phosphatase: 90 IU/L (ref 39–117)
BILIRUBIN TOTAL: 0.8 mg/dL (ref 0.0–1.2)
BUN/Creatinine Ratio: 11 (ref 9–23)
BUN: 9 mg/dL (ref 6–20)
CHLORIDE: 91 mmol/L — AB (ref 96–106)
CO2: 17 mmol/L — ABNORMAL LOW (ref 18–29)
Calcium: 9.4 mg/dL (ref 8.7–10.2)
Creatinine, Ser: 0.85 mg/dL (ref 0.57–1.00)
GFR calc non Af Amer: 91 mL/min/{1.73_m2} (ref >59)
GFR, EST AFRICAN AMERICAN: 105 mL/min/{1.73_m2} (ref >59)
Globulin, Total: 3 g/dL (ref 1.5–4.5)
Glucose: 125 mg/dL — ABNORMAL HIGH (ref 65–99)
POTASSIUM: 3.7 mmol/L (ref 3.5–5.2)
SODIUM: 134 mmol/L (ref 134–144)
TOTAL PROTEIN: 7.4 g/dL (ref 6.0–8.5)

## 2016-06-27 LAB — TSH: TSH: 0.475 u[IU]/mL (ref 0.450–4.500)

## 2016-07-24 ENCOUNTER — Emergency Department
Admission: EM | Admit: 2016-07-24 | Discharge: 2016-07-24 | Disposition: A | Discharge status: Home / Self Care | Payer: Medicare Other | Attending: Emergency Medicine | Admitting: Emergency Medicine

## 2016-07-24 ENCOUNTER — Emergency Department: Payer: Medicare Other

## 2016-07-24 ENCOUNTER — Encounter: Payer: Self-pay | Admitting: Emergency Medicine

## 2016-07-24 DIAGNOSIS — F1420 Cocaine dependence, uncomplicated: Secondary | ICD-10-CM | POA: Diagnosis not present

## 2016-07-24 DIAGNOSIS — G40909 Epilepsy, unspecified, not intractable, without status epilepticus: Secondary | ICD-10-CM

## 2016-07-24 DIAGNOSIS — F10120 Alcohol abuse with intoxication, uncomplicated: Secondary | ICD-10-CM | POA: Insufficient documentation

## 2016-07-24 DIAGNOSIS — F17210 Nicotine dependence, cigarettes, uncomplicated: Secondary | ICD-10-CM | POA: Diagnosis not present

## 2016-07-24 DIAGNOSIS — Z791 Long term (current) use of non-steroidal anti-inflammatories (NSAID): Secondary | ICD-10-CM | POA: Diagnosis not present

## 2016-07-24 DIAGNOSIS — Z79899 Other long term (current) drug therapy: Secondary | ICD-10-CM | POA: Insufficient documentation

## 2016-07-24 DIAGNOSIS — F919 Conduct disorder, unspecified: Secondary | ICD-10-CM | POA: Diagnosis present

## 2016-07-24 DIAGNOSIS — F1020 Alcohol dependence, uncomplicated: Secondary | ICD-10-CM | POA: Diagnosis present

## 2016-07-24 DIAGNOSIS — F250 Schizoaffective disorder, bipolar type: Secondary | ICD-10-CM | POA: Insufficient documentation

## 2016-07-24 DIAGNOSIS — F1994 Other psychoactive substance use, unspecified with psychoactive substance-induced mood disorder: Secondary | ICD-10-CM

## 2016-07-24 DIAGNOSIS — G809 Cerebral palsy, unspecified: Secondary | ICD-10-CM | POA: Diagnosis present

## 2016-07-24 DIAGNOSIS — F10220 Alcohol dependence with intoxication, uncomplicated: Secondary | ICD-10-CM

## 2016-07-24 LAB — URINALYSIS, COMPLETE (UACMP) WITH MICROSCOPIC
BILIRUBIN URINE: NEGATIVE
Bacteria, UA: NONE SEEN
Glucose, UA: 50 mg/dL — AB
Hgb urine dipstick: NEGATIVE
Ketones, ur: NEGATIVE mg/dL
Leukocytes, UA: NEGATIVE
Nitrite: NEGATIVE
PH: 6 (ref 5.0–8.0)
Protein, ur: NEGATIVE mg/dL
RBC / HPF: NONE SEEN RBC/hpf (ref 0–5)
SPECIFIC GRAVITY, URINE: 1.002 — AB (ref 1.005–1.030)
WBC UA: NONE SEEN WBC/hpf (ref 0–5)

## 2016-07-24 LAB — CBC
HCT: 40.9 % (ref 35.0–47.0)
Hemoglobin: 13.9 g/dL (ref 12.0–16.0)
MCH: 33.4 pg (ref 26.0–34.0)
MCHC: 34.1 g/dL (ref 32.0–36.0)
MCV: 97.9 fL (ref 80.0–100.0)
Platelets: 170 10*3/uL (ref 150–440)
RBC: 4.18 MIL/uL (ref 3.80–5.20)
RDW: 13.9 % (ref 11.5–14.5)
WBC: 5.1 10*3/uL (ref 3.6–11.0)

## 2016-07-24 LAB — SALICYLATE LEVEL: Salicylate Lvl: <7 mg/dL (ref 2.8–30.0)

## 2016-07-24 LAB — COMPREHENSIVE METABOLIC PANEL
ALBUMIN: 3.9 g/dL (ref 3.5–5.0)
ALK PHOS: 78 U/L (ref 38–126)
ALT: 55 U/L — ABNORMAL HIGH (ref 14–54)
AST: 82 U/L — ABNORMAL HIGH (ref 15–41)
Anion gap: 10 (ref 5–15)
BUN: <5 mg/dL — ABNORMAL LOW (ref 6–20)
CALCIUM: 8.7 mg/dL — AB (ref 8.9–10.3)
CHLORIDE: 110 mmol/L (ref 101–111)
CO2: 21 mmol/L — AB (ref 22–32)
Creatinine, Ser: 0.66 mg/dL (ref 0.44–1.00)
GFR calc Af Amer: >60 mL/min (ref >60)
GFR calc non Af Amer: >60 mL/min (ref >60)
GLUCOSE: 92 mg/dL (ref 65–99)
Potassium: 3.4 mmol/L — ABNORMAL LOW (ref 3.5–5.1)
SODIUM: 141 mmol/L (ref 135–145)
Total Bilirubin: 0.5 mg/dL (ref 0.3–1.2)
Total Protein: 7.5 g/dL (ref 6.5–8.1)

## 2016-07-24 LAB — URINE DRUG SCREEN, QUALITATIVE (ARMC ONLY)
Amphetamines, Ur Screen: NOT DETECTED
BARBITURATES, UR SCREEN: NOT DETECTED
Benzodiazepine, Ur Scrn: NOT DETECTED
CANNABINOID 50 NG, UR ~~LOC~~: NOT DETECTED
COCAINE METABOLITE, UR ~~LOC~~: NOT DETECTED
MDMA (Ecstasy)Ur Screen: NOT DETECTED
Methadone Scn, Ur: NOT DETECTED
Opiate, Ur Screen: NOT DETECTED
PHENCYCLIDINE (PCP) UR S: NOT DETECTED
TRICYCLIC, UR SCREEN: NOT DETECTED

## 2016-07-24 LAB — RAPID HIV SCREEN (HIV 1/2 AB+AG)
HIV 1/2 Antibodies: NONREACTIVE
HIV-1 P24 ANTIGEN - HIV24: NONREACTIVE

## 2016-07-24 LAB — ETHANOL: Alcohol, Ethyl (B): 311 mg/dL (ref <5)

## 2016-07-24 LAB — PREGNANCY, URINE: PREG TEST UR: NEGATIVE

## 2016-07-24 LAB — ACETAMINOPHEN LEVEL: Acetaminophen (Tylenol), Serum: <10 ug/mL — ABNORMAL LOW (ref 10–30)

## 2016-07-24 MED ORDER — SODIUM CHLORIDE 0.9 % IV SOLN
250.0000 mg | Freq: Once | INTRAVENOUS | Status: AC
  Administered 2016-07-24: 250 mg via INTRAVENOUS
  Filled 2016-07-24: qty 2.5

## 2016-07-24 MED ORDER — ZIPRASIDONE MESYLATE 20 MG IM SOLR
10.0000 mg | Freq: Once | INTRAMUSCULAR | Status: AC
  Administered 2016-07-24: 10 mg via INTRAMUSCULAR

## 2016-07-24 MED ORDER — HALOPERIDOL LACTATE 5 MG/ML IJ SOLN
5.0000 mg | Freq: Once | INTRAMUSCULAR | Status: AC
  Administered 2016-07-24: 5 mg via INTRAMUSCULAR

## 2016-07-24 MED ORDER — LORAZEPAM 2 MG/ML IJ SOLN
INTRAMUSCULAR | Status: AC
  Filled 2016-07-24: qty 1

## 2016-07-24 MED ORDER — DIPHENHYDRAMINE HCL 50 MG/ML IJ SOLN
INTRAMUSCULAR | Status: AC
  Filled 2016-07-24: qty 1

## 2016-07-24 MED ORDER — LORAZEPAM 2 MG/ML IJ SOLN
2.0000 mg | Freq: Once | INTRAMUSCULAR | Status: AC
  Administered 2016-07-24: 2 mg via INTRAVENOUS

## 2016-07-24 MED ORDER — HALOPERIDOL LACTATE 5 MG/ML IJ SOLN
INTRAMUSCULAR | Status: AC
  Filled 2016-07-24: qty 1

## 2016-07-24 MED ORDER — THIAMINE HCL 100 MG/ML IJ SOLN
Freq: Once | INTRAVENOUS | Status: AC
  Administered 2016-07-24: 03:00:00 via INTRAVENOUS
  Filled 2016-07-24: qty 1000

## 2016-07-24 MED ORDER — SODIUM CHLORIDE 0.9 % IV BOLUS (SEPSIS)
1000.0000 mL | Freq: Once | INTRAVENOUS | Status: AC
  Administered 2016-07-24: 1000 mL via INTRAVENOUS

## 2016-07-24 NOTE — ED Triage Notes (Signed)
Pt arrived to ED per [place] police[place], laughing out loud and screaming. Per officer, pt was talking to herself and self conversation was nonsensical. Pt was aggressive towards officers. Upon arrival to ED pt was aggressive, combative, using slurred speech, pt unable to speak in complete sentences. Pt communicating threats to staff. MD at bedside. See MAR for interventions.

## 2016-07-24 NOTE — ED Notes (Signed)
Patient sleeping at this time.

## 2016-07-24 NOTE — Consult Note (Signed)
[place] [facility] Consult   Reason for Consult:  Consult for 31 year old woman with a history of substance abuse and chronic mental health issues brought to the hospital last night agitated and intoxicated Referring Physician:  Darl Householder Patient Identification: Sylvia [name] MRN:  [id number] Principal Diagnosis: Substance induced mood disorder ([place]) Diagnosis:   Patient Active Problem List   Diagnosis Date Noted  . Substance induced mood disorder ([place]) [F19.94] 07/24/2016  . 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 Cocaine use disorder, moderate, dependence ([place]) [F14.20] 03/17/2015  . Cannabis use disorder, mild, abuse [F12.10] 03/17/2015  . Alcohol use disorder, severe, dependence ([place]) [F10.20] 03/17/2015  . Schizoaffective disorder, bipolar type ([place]) [F25.0] 03/16/2015  . Seizure disorder ([place]) [X32.355] 09/17/2013  . Cellulitis and abscess of toe of right foot [L03.031, L02.611] 09/16/2013  . Hepatitis C [B19.20]   . CP (cerebral palsy) ([place]) [G80.9]     Total Time spent with patient: 1 hour  Subjective:   Sylvia [name] is a 31 y.o. female patient admitted with "I'm an alcoholic".  HPI:  Patient interviewed chart reviewed. Patient was brought in by police officers last night at that time was very agitated and belligerent. She was described as spitting at staff refusing to cooperate with staff. Patient's blood alcohol level was obtained and was over 300. She tells me today that what happened was that she had gone to [facility] while drunk yesterday. She admits that she had been drinking heavily and that when she was out in the parking lot after leaving the store she fell down. The notes from the police officers describe that she was actually singing to herself and acting bizarrely. When they went up to confront her she says that she "faked a seizure" so that she could come to the hospital rather than jail. She has slept overnight and today says she would like to go  home. Patient denies being depressed. Denies any recent mood changes. Totally denies any suicidal or homicidal thoughts. Denies any auditory or visual hallucinations. She says that she is at least partially compliant with her medicines although the fact that she takes so many of them means that she probably doesn't take them all quite as much as she should. She says that she drinks heavily on a pretty much daily basis. Uses marijuana frequently but denies any other active drug abuse. She follows up with an active team.  Social history: Patient lives independently. She has an apartment through the housing authority and has inspection this Friday. She hasn't act team to see her regularly. She has family but doesn't stay in close contact with any of them.  Medical history: Cerebral palsy with some chronic unsteadiness in her gait and weakness on the right side. History of seizure disorder possibly related to alcohol abuse. History of hepatitis C and damage of her liver.  Substance abuse history: Long-standing alcohol abuse. Longest sobriety was entirely while she was incarcerated. Patient tells me she has no interest in stopping drinking.  Past Psychiatric History: Patient has had psychiatric evaluation and treatment in the past. In our notes it looks like she was seen at behavioral health [facility] last year but the ultimate disposition is unclear. Patient denies any past suicidal behavior. She carries a diagnosis of schizoaffective disorder in her old chart and was on antipsychotics. It's not clear to me how different that is been from her substance abuse.  Risk to Self: Is patient at risk for suicide?: No Risk to Others:   Prior  Inpatient Therapy:   Prior Outpatient Therapy:    Past Medical History:  Past Medical History:  Diagnosis Date  . CP (cerebral palsy) ([place])   . Hepatitis C   . Herpes   . Schizoaffective schizophrenia ([place]) 09/17/2013   Per notes from [facility] as on 2014   . Seizures ([place])    . Stroke [facility])     Past Surgical History:  Procedure Laterality Date  . 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 FOOT SURGERY     rt and left  . LEG SURGERY Right    x7   Family History:  Family History  Problem Relation Age of Onset  . Bipolar disorder Mother   . Cancer Maternal Grandmother   . Diabetes Maternal Grandfather   . Hypertension Maternal Grandfather    Family Psychiatric  History: Does not know of any family history Social History:  History  Alcohol Use  . 1.8 oz/week  . 3 Cans of beer per week    Comment: Per patient 0.5 gallon to gallon when she can but since being in group home she can't do it as often      History  Drug Use  . Types: "Crack" cocaine, Marijuana    Comment: Per patient used to use herion and crack but none in "a few years"     Social History   Social History  . Marital status: Single    Spouse name: N/A  . Number of children: N/A  . Years of education: N/A   Social History Main Topics  . Smoking status: Current Every Day Smoker    Packs/day: 0.50    Years: 20.00    Types: Cigarettes  . Smokeless tobacco: Former Systems developer  . Alcohol use 1.8 oz/week    3 Cans of beer per week     Comment: Per patient 0.5 gallon to gallon when she can but since being in group home she can't do it as often   . Drug use: Yes    Types: "Crack" cocaine, Marijuana     Comment: Per patient used to use herion and crack but none in "a few years"   . Sexual activity: Not Currently    Birth control/ protection: Implant   Other Topics Concern  . None   Social History Narrative  . None   Additional Social History:    Allergies:   Allergies  Allergen Reactions  . Diphenhydramine Hcl Rash    Reaction:  Lowers Dilantin level   . Fluoxetine Other (See Comments)    Reaction:  Altered mental status   . Depakote  [Divalproex Sodium] Other (See Comments)    Reaction:  Seizures and drops pts BP  . Quetiapine Other (See Comments)    Reaction:  Drops pts BP    Labs:  Results for orders placed or performed during the hospital encounter of 07/24/16 (from the past 48 hour(s))  Comprehensive metabolic panel     Status: Abnormal   Collection Time: 07/24/16  2:03 AM  Result Value Ref Range   Sodium 141 135 - 145 mmol/L   Potassium 3.4 (L) 3.5 - 5.1 mmol/L   Chloride 110 101 - 111 mmol/L   CO2 21 (L) 22 - 32 mmol/L   Glucose, Bld 92 65 - 99 mg/dL   BUN <5 (L) 6 - 20 mg/dL   Creatinine, Ser 0.66  0.44 - 1.00 mg/dL   Calcium 8.7 (L) 8.9 - 10.3 mg/dL   Total Protein 7.5 6.5 - 8.1 g/dL   Albumin 3.9 3.5 - 5.0 g/dL   AST 82 (H) 15 - 41 U/L   ALT 55 (H) 14 - 54 U/L   Alkaline Phosphatase 78 38 - 126 U/L   Total Bilirubin 0.5 0.3 - 1.2 mg/dL   GFR calc non Af Amer >60 >60 mL/min   GFR calc Af Amer >60 >60 mL/min    Comment: (NOTE) The eGFR has been calculated using the CKD EPI equation. This calculation has not been validated in all clinical situations. eGFR's persistently <60 mL/min signify possible Chronic Kidney Disease.    Anion gap 10 5 - 15  Ethanol     Status: Abnormal   Collection Time: 07/24/16  2:03 AM  Result Value Ref Range   Alcohol, Ethyl (B) 311 (HH) <5 mg/dL    Comment: CRITICAL RESULT CALLED TO, READ BACK BY AND VERIFIED WITH LEA FURGURSON RN AT 1007 07/24/16 MSS.        LOWEST DETECTABLE LIMIT FOR SERUM ALCOHOL IS 5 mg/dL FOR MEDICAL PURPOSES ONLY   Salicylate level     Status: None   Collection Time: 07/24/16  2:03 AM  Result Value Ref Range   Salicylate Lvl <1.2 2.8 - 30.0 mg/dL  Acetaminophen level     Status: Abnormal   Collection Time: 07/24/16  2:03 AM  Result Value Ref Range   Acetaminophen (Tylenol), Serum <10 (L) 10 - 30 ug/mL    Comment:        THERAPEUTIC CONCENTRATIONS VARY SIGNIFICANTLY. A RANGE OF 10-30 ug/mL MAY BE AN EFFECTIVE CONCENTRATION FOR MANY  PATIENTS. HOWEVER, SOME ARE BEST TREATED AT CONCENTRATIONS OUTSIDE THIS RANGE. ACETAMINOPHEN CONCENTRATIONS >150 ug/mL AT 4 HOURS AFTER INGESTION AND >50 ug/mL AT 12 HOURS AFTER INGESTION ARE OFTEN ASSOCIATED WITH TOXIC REACTIONS.   cbc     Status: None   Collection Time: 07/24/16  2:03 AM  Result Value Ref Range   WBC 5.1 3.6 - 11.0 K/uL   RBC 4.18 3.80 - 5.20 MIL/uL   Hemoglobin 13.9 12.0 - 16.0 g/dL   HCT 40.9 35.0 - 47.0 %   MCV 97.9 80.0 - 100.0 fL   MCH 33.4 26.0 - 34.0 pg   MCHC 34.1 32.0 - 36.0 g/dL   RDW 13.9 11.5 - 14.5 %   Platelets 170 150 - 440 K/uL  Urine Drug Screen, Qualitative     Status: None   Collection Time: 07/24/16  2:03 AM  Result Value Ref Range   Tricyclic, Ur Screen NONE DETECTED NONE DETECTED   Amphetamines, Ur Screen NONE DETECTED NONE DETECTED   MDMA (Ecstasy)Ur Screen NONE DETECTED NONE DETECTED   Cocaine Metabolite,Ur [place] NONE DETECTED NONE DETECTED   Opiate, Ur Screen NONE DETECTED NONE DETECTED   Phencyclidine (PCP) Ur S NONE DETECTED NONE DETECTED   Cannabinoid 50 Ng, Ur [place] NONE DETECTED NONE DETECTED   Barbiturates, Ur Screen NONE DETECTED NONE DETECTED   Benzodiazepine, Ur Scrn NONE DETECTED NONE DETECTED   Methadone Scn, Ur NONE DETECTED NONE DETECTED    Comment: (NOTE) 197  Tricyclics, urine               Cutoff 1000 ng/mL 200  Amphetamines, urine             Cutoff 1000 ng/mL 300  MDMA (Ecstasy), urine           Cutoff 500 ng/mL  400  Cocaine Metabolite, urine       Cutoff 300 ng/mL 500  Opiate, urine                   Cutoff 300 ng/mL 600  Phencyclidine (PCP), urine      Cutoff 25 ng/mL 700  Cannabinoid, urine              Cutoff 50 ng/mL 800  Barbiturates, urine             Cutoff 200 ng/mL 900  Benzodiazepine, urine           Cutoff 200 ng/mL 1000 Methadone, urine                Cutoff 300 ng/mL 1100 1200 The urine drug screen provides only a preliminary, unconfirmed 1300 analytical test result and should not be used for  non-medical 1400 purposes. Clinical consideration and professional judgment should 1500 be applied to any positive drug screen result due to possible 1600 interfering substances. A more specific alternate chemical method 1700 must be used in order to obtain a confirmed analytical result.  1800 Gas chromato graphy / mass spectrometry (GC/MS) is the preferred 1900 confirmatory method.   Urinalysis, Complete w Microscopic     Status: Abnormal   Collection Time: 07/24/16  2:03 AM  Result Value Ref Range   Color, Urine STRAW (A) YELLOW   APPearance CLEAR (A) CLEAR   Specific Gravity, Urine 1.002 (L) 1.005 - 1.030   pH 6.0 5.0 - 8.0   Glucose, UA 50 (A) NEGATIVE mg/dL   Hgb urine dipstick NEGATIVE NEGATIVE   Bilirubin Urine NEGATIVE NEGATIVE   Ketones, ur NEGATIVE NEGATIVE mg/dL   Protein, ur NEGATIVE NEGATIVE mg/dL   Nitrite NEGATIVE NEGATIVE   Leukocytes, UA NEGATIVE NEGATIVE   RBC / HPF NONE SEEN 0 - 5 RBC/hpf   WBC, UA NONE SEEN 0 - 5 WBC/hpf   Bacteria, UA NONE SEEN NONE SEEN   Squamous Epithelial / LPF 0-5 (A) NONE SEEN  Pregnancy, urine     Status: None   Collection Time: 07/24/16  2:03 AM  Result Value Ref Range   Preg Test, Ur NEGATIVE NEGATIVE  Rapid HIV screen (HIV 1/2 Ab+Ag)     Status: None   Collection Time: 07/24/16  2:03 AM  Result Value Ref Range   HIV-1 P24 Antigen - HIV24 NON REACTIVE NON REACTIVE   HIV 1/2 Antibodies NON REACTIVE NON REACTIVE   Interpretation (HIV Ag Ab)      A non reactive test result means that HIV 1 or HIV 2 antibodies and HIV 1 p24 antigen were not detected in the specimen.    No current facility-administered medications for this encounter.    Current Outpatient Prescriptions  Medication Sig Dispense Refill  . baclofen (LIORESAL) 10 MG tablet Take 10 mg by mouth 3 (three) times daily.    . benztropine (COGENTIN) 0.5 MG tablet Take 0.5 mg by mouth at bedtime.    . citalopram (CELEXA) 20 MG tablet Take 20 mg by mouth daily.    Marland Kitchen  gabapentin (NEURONTIN) 300 MG capsule Take 300 mg by mouth 2 (two) times daily.    Marland Kitchen gabapentin (NEURONTIN) 300 MG capsule Take 300 mg by mouth 2 (two) times daily.    Marland Kitchen gabapentin (NEURONTIN) 400 MG capsule Take 400 mg by mouth at bedtime.    . hydrOXYzine (ATARAX/VISTARIL) 50 MG tablet Take 50 mg by mouth 3 (three) times daily.    Marland Kitchen  levETIRAcetam (KEPPRA) 250 MG tablet Take 250 mg by mouth at bedtime.    . ondansetron (ZOFRAN ODT) 4 MG disintegrating tablet Take 1 tablet (4 mg total) by mouth every 8 (eight) hours as needed for nausea or vomiting. 20 tablet 0  . risperiDONE (RISPERDAL) 3 MG tablet Take 2 tablets (6 mg total) by mouth at bedtime. 60 tablet 0  . valACYclovir (VALTREX) 1000 MG tablet Take 1 tablet (1,000 mg total) by mouth daily. 5 tablet 6  . Vitamin D, Ergocalciferol, (DRISDOL) 50000 units CAPS capsule Take 50,000 Units by mouth every 7 (seven) days.    Marland Kitchen dolutegravir (TIVICAY) 50 MG tablet Take 1 tablet (50 mg total) by mouth daily. (Patient not taking: Reported on 07/06/2015) 28 tablet 0  . emtricitabine-tenofovir (TRUVADA) 200-300 MG tablet Take 1 tablet by mouth daily. (Patient not taking: Reported on 07/06/2015) 28 tablet 0  . ibuprofen (ADVIL,MOTRIN) 400 MG tablet Take 1 tablet (400 mg total) by mouth 3 (three) times daily. (Patient not taking: Reported on 09/09/2015) 30 tablet 0  . methocarbamol (ROBAXIN) 500 MG tablet Take 1 tablet (500 mg total) by mouth 2 (two) times daily as needed for muscle spasms. (Patient not taking: Reported on 09/09/2015) 20 tablet 0  . nicotine polacrilex (NICORETTE) 2 MG gum Take 1 each (2 mg total) by mouth as needed for smoking cessation. (Patient not taking: Reported on 07/06/2015) 100 tablet 0    Musculoskeletal: Strength & Muscle Tone: decreased Gait & Station: unsteady Patient leans: N/A  Psychiatric Specialty Exam: Physical Exam  Nursing note and vitals reviewed. Constitutional: She appears well-developed and well-nourished.  HENT:   Head: Normocephalic and atraumatic.  Eyes: Conjunctivae are normal. Pupils are equal, round, and reactive to light.  Neck: Normal range of motion.  Cardiovascular: Regular rhythm and normal heart sounds.   Respiratory: Effort normal. No respiratory distress.  GI: Soft.  Musculoskeletal: Normal range of motion.  Neurological: She is alert.  Skin: Skin is warm and dry.  Psychiatric: Her affect is blunt. Her speech is delayed. She is slowed. Thought content is not paranoid and not delusional. She expresses impulsivity. She expresses no homicidal and no suicidal ideation. She exhibits abnormal recent memory.    Review of Systems  Constitutional: Negative.   HENT: Negative.   Eyes: Negative.   Respiratory: Negative.   Cardiovascular: Negative.   Gastrointestinal: Negative.   Musculoskeletal: Positive for back pain.  Skin: Negative.   Neurological: Negative.   Psychiatric/Behavioral: Positive for memory loss and substance abuse. Negative for depression, hallucinations and suicidal ideas. The patient is nervous/anxious. The patient does not have insomnia.     Blood pressure 109/77, pulse 86, temperature 97.3 F (36.3 C), resp. rate 18, SpO2 98 %.There is no height or weight on file to calculate BMI.  General Appearance: Disheveled  Eye Contact:  Minimal  Speech:  Slow  Volume:  Decreased  Mood:  Dysphoric  Affect:  Congruent  Thought Process:  Goal Directed  Orientation:  Full (Time, Place, and Person)  Thought Content:  Rumination  Suicidal Thoughts:  No  Homicidal Thoughts:  No  Memory:  Immediate;   Fair Recent;   Poor Remote;   Good  Judgement:  Impaired  Insight:  Shallow  Psychomotor Activity:  Decreased  Concentration:  Concentration: Fair  Recall:  [employer] of Knowledge:  Fair  Language:  Fair  Akathisia:  No  Handed:  Right  AIMS (if indicated):     Assets:  Housing Social Support  ADL's:  Intact  Cognition:  Impaired,  Mild  Sleep:        Treatment Plan  Summary: Plan 31 year old woman was drunk last night but today is sober. She is calm and not aggressive and denies any suicidal thoughts. Patient articulates an appropriate plan for going back home and trying to make sure her apartment passes inspection. She has no interest in drinking although she understands that she has a problem with alcohol abuse and understands the risks that it imposes of seizures and liver disease. Patient was taken off involuntary commitment. Encouraged obviously to stop drinking and follow-up with her act team. Case reviewed with emergency room physician and TTS.  Disposition: Patient does not meet criteria for psychiatric inpatient admission. Supportive therapy provided about ongoing stressors.  Alethia Berthold, MD 07/24/2016 2:20 PM

## 2016-07-24 NOTE — ED Notes (Signed)
ED  Is the patient under IVC or is there intent for IVC: Yes.   Is the patient medically cleared: Yes.   Is there vacancy in the ED BHU: Yes.   Is the population mix appropriate for patient: Yes.   Is the patient awaiting placement in inpatient or outpatient setting:   Has the patient had a psychiatric consult: consult pending  Survey of unit performed for contraband, proper placement and condition of furniture, tampering with fixtures in bathroom, shower, and each patient room: Yes.  ; Findings:  APPEARANCE/BEHAVIOR Calm and cooperative NEURO ASSESSMENT Orientation: oriented x3  Denies pain Hallucinations: No.None noted (Hallucinations) Speech: Normal Gait: normal RESPIRATORY ASSESSMENT Even  Unlabored respirations  CARDIOVASCULAR ASSESSMENT Pulses equal   regular rate  Skin warm and dry   GASTROINTESTINAL ASSESSMENT no GI complaint EXTREMITIES Full ROM  PLAN OF CARE Provide calm/safe environment. Vital signs assessed twice daily. ED BHU Assessment once each 12-hour shift. Collaborate with TTS daily or as condition indicates. Assure the ED provider has rounded once each shift. Provide and encourage hygiene. Provide redirection as needed. Assess for escalating behavior; address immediately and inform ED provider.  Assess family dynamic and appropriateness for visitation as needed: Yes.  ; If necessary, describe findings:  Educate the patient/family about BHU procedures/visitation: Yes.  ; If necessary, describe findings:   

## 2016-07-24 NOTE — ED Notes (Signed)
BEHAVIORAL HEALTH ROUNDING Patient sleeping: No. Patient alert and oriented: yes Behavior appropriate: Yes.  ; If no, describe:  Nutrition and fluids offered: yes Toileting and hygiene offered: Yes  Sitter present: q15 minute observations and security  monitoring Law enforcement present: Yes  ODS  
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
## 2016-07-24 NOTE — Discharge Instructions (Signed)
Stop drinking alcohol.   Take your medicine as prescribed by your doctor.   Follow up with your doctor  Return to ER if you have seizure, thoughts of harming yourself or others, hallucinations.

## 2016-07-24 NOTE — ED Notes (Signed)

## 2016-07-24 NOTE — ED Notes (Signed)
Attempt to ask questions for complete assessment, pt is non cooperative at this time. Will try to reassess when pts ETOH is lower and pt is calm and cooperative. MD made aware.

## 2016-07-24 NOTE — ED Notes (Signed)
Patient observed lying in bed with eyes closed  Even, unlabored respirations observed   NAD pt appears to be sleeping  I will continue to monitor along with every 15 minute visual observations and ongoing security monitoring    

## 2016-07-24 NOTE — ED Notes (Signed)
Forensic restraints removed per [employer] officer. Pt cooperative at this time. Sitter at bedside while IV medications are administered. See MAR for medications.

## 2016-07-24 NOTE — ED Notes (Signed)
Pt brought in per [employer] in forensic restraints. Pt spitting/biting at staff and [employer]. Mask in place on pt. Pt cursing and communicating threats towards staff. [employer] at bedside with pt. MD/RN in room. Pt refusing to cooperate with commands and refusing to answer questions asked per staff. See MAR for intervention.

## 2016-07-24 NOTE — ED Notes (Signed)
Patient in hallway yelling I need my meds no have given me anything.

## 2016-07-24 NOTE — ED Notes (Signed)
Plan of care discussed - no am meds ordered at this time  - psych consult pending    Continue to monitor

## 2016-07-24 NOTE — ED Provider Notes (Signed)
  Physical Exam  BP 109/77   Pulse 86   Temp 97.3 F (36.3 C)   Resp 18   SpO2 98%   Physical Exam  ED Course  Procedures  MDM Patient care assumed at 7 am. Patient [facility] Patient was found to have ETOH of 300 on arrival. Dr. Toni Amendlapacs saw patient and rescinded the IVC. Patient is not interested in detox. Will discharge patient.      Charlynne PanderYao, [name] Hsienta, MD 07/24/16 1356

## 2016-07-24 NOTE — ED Notes (Signed)
Pt taken down to Ct with officer and EDT.

## 2016-09-13 ENCOUNTER — Emergency Department
Admission: EM | Admit: 2016-09-13 | Discharge: 2016-09-14 | Disposition: A | Discharge status: Home / Self Care | Payer: Medicare Other | Attending: Emergency Medicine | Admitting: Emergency Medicine

## 2016-09-13 ENCOUNTER — Encounter: Payer: Self-pay | Admitting: Emergency Medicine

## 2016-09-13 DIAGNOSIS — R569 Unspecified convulsions: Secondary | ICD-10-CM | POA: Diagnosis not present

## 2016-09-13 DIAGNOSIS — F17210 Nicotine dependence, cigarettes, uncomplicated: Secondary | ICD-10-CM | POA: Insufficient documentation

## 2016-09-13 DIAGNOSIS — Z79899 Other long term (current) drug therapy: Secondary | ICD-10-CM | POA: Insufficient documentation

## 2016-09-13 DIAGNOSIS — F10929 Alcohol use, unspecified with intoxication, unspecified: Secondary | ICD-10-CM

## 2016-09-13 LAB — COMPREHENSIVE METABOLIC PANEL
ALBUMIN: 4.4 g/dL (ref 3.5–5.0)
ALK PHOS: 66 U/L (ref 38–126)
ALT: 77 U/L — AB (ref 14–54)
ANION GAP: 10 (ref 5–15)
AST: 101 U/L — ABNORMAL HIGH (ref 15–41)
BILIRUBIN TOTAL: 0.5 mg/dL (ref 0.3–1.2)
CALCIUM: 9.7 mg/dL (ref 8.9–10.3)
CO2: 22 mmol/L (ref 22–32)
CREATININE: 0.57 mg/dL (ref 0.44–1.00)
Chloride: 105 mmol/L (ref 101–111)
GFR calc Af Amer: >60 mL/min (ref >60)
GFR calc non Af Amer: >60 mL/min (ref >60)
GLUCOSE: 87 mg/dL (ref 65–99)
Potassium: 3.9 mmol/L (ref 3.5–5.1)
SODIUM: 137 mmol/L (ref 135–145)
TOTAL PROTEIN: 7.7 g/dL (ref 6.5–8.1)

## 2016-09-13 LAB — ACETAMINOPHEN LEVEL

## 2016-09-13 LAB — CBC WITH DIFFERENTIAL/PLATELET
BASOS PCT: 1 %
Basophils Absolute: 0 10*3/uL (ref 0–0.1)
EOS ABS: 0.1 10*3/uL (ref 0–0.7)
EOS PCT: 2 %
HCT: 37.7 % (ref 35.0–47.0)
HEMOGLOBIN: 12.8 g/dL (ref 12.0–16.0)
LYMPHS ABS: 1.5 10*3/uL (ref 1.0–3.6)
Lymphocytes Relative: 36 %
MCH: 34.6 pg — AB (ref 26.0–34.0)
MCHC: 34 g/dL (ref 32.0–36.0)
MCV: 101.8 fL — ABNORMAL HIGH (ref 80.0–100.0)
MONO ABS: 0.4 10*3/uL (ref 0.2–0.9)
MONOS PCT: 11 %
Neutro Abs: 2.1 10*3/uL (ref 1.4–6.5)
Neutrophils Relative %: 50 %
Platelets: 103 10*3/uL — ABNORMAL LOW (ref 150–440)
RBC: 3.7 MIL/uL — ABNORMAL LOW (ref 3.80–5.20)
RDW: 13.9 % (ref 11.5–14.5)
WBC: 4.1 10*3/uL (ref 3.6–11.0)

## 2016-09-13 LAB — URINE DRUG SCREEN, QUALITATIVE (ARMC ONLY)
AMPHETAMINES, UR SCREEN: NOT DETECTED
Barbiturates, Ur Screen: NOT DETECTED
Benzodiazepine, Ur Scrn: NOT DETECTED
CANNABINOID 50 NG, UR ~~LOC~~: NOT DETECTED
COCAINE METABOLITE, UR ~~LOC~~: NOT DETECTED
MDMA (ECSTASY) UR SCREEN: NOT DETECTED
Methadone Scn, Ur: NOT DETECTED
Opiate, Ur Screen: NOT DETECTED
Phencyclidine (PCP) Ur S: NOT DETECTED
TRICYCLIC, UR SCREEN: NOT DETECTED

## 2016-09-13 LAB — SALICYLATE LEVEL

## 2016-09-13 LAB — PREGNANCY, URINE: Preg Test, Ur: NEGATIVE

## 2016-09-13 LAB — ETHANOL: Alcohol, Ethyl (B): 111 mg/dL — ABNORMAL HIGH (ref <5)

## 2016-09-13 MED ORDER — LORAZEPAM 2 MG/ML IJ SOLN: 1.0000 mg | Freq: Once | INTRAMUSCULAR | Status: DC

## 2016-09-13 MED ORDER — SODIUM CHLORIDE 0.9 % IV BOLUS (SEPSIS)
1000.0000 mL | Freq: Once | INTRAVENOUS | Status: AC
  Administered 2016-09-13: 1000 mL via INTRAVENOUS

## 2016-09-13 MED ORDER — THIAMINE HCL 100 MG/ML IJ SOLN
Freq: Once | INTRAVENOUS | Status: AC
  Administered 2016-09-13: 21:00:00 via INTRAVENOUS
  Filled 2016-09-13: qty 1000

## 2016-09-13 MED ORDER — LORAZEPAM 2 MG/ML IJ SOLN
2.0000 mg | Freq: Once | INTRAMUSCULAR | Status: AC
  Administered 2016-09-13: 2 mg via INTRAVENOUS
  Filled 2016-09-13: qty 1

## 2016-09-13 NOTE — Discharge Instructions (Addendum)
Please continue to take all of your medications. Please return here for any further problems. Please follow-up with either RTS or Alcoholics Anonymous. They might help you with your drinking.
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
## 2016-09-13 NOTE — ED Notes (Signed)
TTS, [place], with pt att
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
## 2016-09-14 DIAGNOSIS — R569 Unspecified convulsions: Secondary | ICD-10-CM | POA: Diagnosis not present

## 2016-09-14 MED ORDER — LORAZEPAM 1 MG PO TABS: ORAL_TABLET | ORAL | 0 refills | Status: AC

## 2016-09-14 NOTE — ED Notes (Signed)
[facility]

## 2016-09-14 NOTE — ED Provider Notes (Signed)
Signout from Dr. Juliette AlcideMelinda to reassess the patient for sobriety. Possible withdrawal seizure versus pseudoseizure however the patient is on Keppra.  Physical Exam  BP 110/81 (BP Location: Right Arm)   Pulse 84   Temp 98.5 F (36.9 C) (Oral)   Resp 18   Ht 5\' 4"  (1.626 m)   Wt 70.3 kg (155 lb)   SpO2 98%   BMI 26.61 kg/m  ----------------------------------------- 2:48 AM on 09/14/2016 -----------------------------------------   Physical Exam Patient is awake and alert at this time. No tremor. Clinically sober with normal speech and insight. ED Course  Procedures  MDM Patient says that she was treating her withdrawal symptoms with Ativan over the past several days. Requesting Ativan upon discharge. I believe this is appropriate given the patient's degree of drinking in the past. She also has a warrant and will be discharged with police.       Myrna BlazerSchaevitz, [name] Matthew, MD 09/14/16 [phone]

## 2016-09-25 ENCOUNTER — Telehealth: Payer: Self-pay | Admitting: Emergency Medicine

## 2016-09-25 NOTE — Telephone Encounter (Signed)
[facility] called says pt insurance company prefers no more than 10 mg lorazepam per day.  Per dr Don Perkingveronese can add this to rx instructions.

## 2016-12-25 ENCOUNTER — Emergency Department
Admission: EM | Admit: 2016-12-25 | Discharge: 2016-12-27 | Disposition: A | Discharge status: Home / Self Care | Payer: Medicare Other | Attending: Emergency Medicine | Admitting: Emergency Medicine

## 2016-12-25 ENCOUNTER — Other Ambulatory Visit: Payer: Self-pay

## 2016-12-25 DIAGNOSIS — B20 Human immunodeficiency virus [HIV] disease: Secondary | ICD-10-CM | POA: Diagnosis not present

## 2016-12-25 DIAGNOSIS — F17210 Nicotine dependence, cigarettes, uncomplicated: Secondary | ICD-10-CM | POA: Insufficient documentation

## 2016-12-25 DIAGNOSIS — Z79899 Other long term (current) drug therapy: Secondary | ICD-10-CM | POA: Diagnosis not present

## 2016-12-25 DIAGNOSIS — Z0189 Encounter for other specified special examinations: Secondary | ICD-10-CM | POA: Diagnosis present

## 2016-12-25 DIAGNOSIS — F10920 Alcohol use, unspecified with intoxication, uncomplicated: Secondary | ICD-10-CM | POA: Diagnosis not present

## 2016-12-25 DIAGNOSIS — F250 Schizoaffective disorder, bipolar type: Secondary | ICD-10-CM | POA: Diagnosis present

## 2016-12-25 DIAGNOSIS — F1020 Alcohol dependence, uncomplicated: Secondary | ICD-10-CM | POA: Diagnosis present

## 2016-12-25 DIAGNOSIS — Z8673 Personal history of transient ischemic attack (TIA), and cerebral infarction without residual deficits: Secondary | ICD-10-CM | POA: Insufficient documentation

## 2016-12-25 DIAGNOSIS — F1994 Other psychoactive substance use, unspecified with psychoactive substance-induced mood disorder: Secondary | ICD-10-CM | POA: Diagnosis present

## 2016-12-25 DIAGNOSIS — G809 Cerebral palsy, unspecified: Secondary | ICD-10-CM | POA: Diagnosis present

## 2016-12-25 DIAGNOSIS — F1910 Other psychoactive substance abuse, uncomplicated: Secondary | ICD-10-CM | POA: Insufficient documentation

## 2016-12-25 DIAGNOSIS — R569 Unspecified convulsions: Secondary | ICD-10-CM | POA: Diagnosis not present

## 2016-12-25 DIAGNOSIS — F1420 Cocaine dependence, uncomplicated: Secondary | ICD-10-CM | POA: Diagnosis present

## 2016-12-25 LAB — COMPREHENSIVE METABOLIC PANEL
ALT: 56 U/L — AB (ref 14–54)
AST: 70 U/L — AB (ref 15–41)
Albumin: 4 g/dL (ref 3.5–5.0)
Alkaline Phosphatase: 68 U/L (ref 38–126)
Anion gap: 11 (ref 5–15)
BUN: 8 mg/dL (ref 6–20)
CHLORIDE: 101 mmol/L (ref 101–111)
CO2: 21 mmol/L — AB (ref 22–32)
CREATININE: 0.6 mg/dL (ref 0.44–1.00)
Calcium: 9.1 mg/dL (ref 8.9–10.3)
GFR calc Af Amer: >60 mL/min (ref >60)
GFR calc non Af Amer: >60 mL/min (ref >60)
Glucose, Bld: 88 mg/dL (ref 65–99)
Potassium: 4.2 mmol/L (ref 3.5–5.1)
SODIUM: 133 mmol/L — AB (ref 135–145)
Total Bilirubin: 0.5 mg/dL (ref 0.3–1.2)
Total Protein: 7.6 g/dL (ref 6.5–8.1)

## 2016-12-25 LAB — URINE DRUG SCREEN, QUALITATIVE (ARMC ONLY)
AMPHETAMINES, UR SCREEN: NOT DETECTED
Barbiturates, Ur Screen: NOT DETECTED
Benzodiazepine, Ur Scrn: POSITIVE — AB
COCAINE METABOLITE, UR ~~LOC~~: POSITIVE — AB
Cannabinoid 50 Ng, Ur ~~LOC~~: NOT DETECTED
MDMA (Ecstasy)Ur Screen: NOT DETECTED
METHADONE SCREEN, URINE: NOT DETECTED
OPIATE, UR SCREEN: NOT DETECTED
Phencyclidine (PCP) Ur S: NOT DETECTED
Tricyclic, Ur Screen: NOT DETECTED

## 2016-12-25 LAB — HCG, QUANTITATIVE, PREGNANCY: hCG, Beta Chain, Quant, S: <1 m[IU]/mL (ref <5)

## 2016-12-25 LAB — ACETAMINOPHEN LEVEL: Acetaminophen (Tylenol), Serum: <10 ug/mL — ABNORMAL LOW (ref 10–30)

## 2016-12-25 LAB — SALICYLATE LEVEL: Salicylate Lvl: <7 mg/dL (ref 2.8–30.0)

## 2016-12-25 LAB — ETHANOL: Alcohol, Ethyl (B): 270 mg/dL — ABNORMAL HIGH (ref <10)

## 2016-12-25 MED ORDER — HALOPERIDOL LACTATE 5 MG/ML IJ SOLN
5.0000 mg | Freq: Once | INTRAMUSCULAR | Status: AC
  Administered 2016-12-25: 5 mg via INTRAMUSCULAR
  Filled 2016-12-25: qty 1

## 2016-12-25 MED ORDER — LORAZEPAM 2 MG/ML IJ SOLN
2.0000 mg | Freq: Once | INTRAMUSCULAR | Status: AC
  Administered 2016-12-25: 2 mg via INTRAMUSCULAR
  Filled 2016-12-25: qty 1

## 2016-12-25 NOTE — ED Notes (Signed)
BEHAVIORAL HEALTH ROUNDING Patient sleeping: No. Patient alert and oriented: yes Behavior appropriate: Yes.  ; If no, describe:  Nutrition and fluids offered: yes Toileting and hygiene offered: Yes  Sitter present: q15 minute observations and security  monitoring Law enforcement present: Yes  ODS  

## 2016-12-25 NOTE — ED Notes (Signed)
She continues to rest quietly in recliner  Continue to monitor

## 2016-12-25 NOTE — ED Notes (Signed)
Pt being very loud with cussing in the hallway and wishing death on people. Pt is irate and talking nonsense.
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
## 2016-12-25 NOTE — ED Notes (Signed)

## 2016-12-26 NOTE — ED Notes (Signed)
BEHAVIORAL HEALTH ROUNDING Patient sleeping: Yes.   Patient alert and oriented: eyes closed  Appears to be asleep Behavior appropriate: Yes.  ; If no, describe:  Nutrition and fluids offered: Yes  Toileting and hygiene offered: sleeping Sitter present: q 15 minute observations and security monitoring Law enforcement present: yes  ODS 

## 2016-12-26 NOTE — Consult Note (Signed)
Psychiatry: I have attempted to evaluate this 31 year old woman with a history of schizophrenia and substance abuse twice this afternoon.  On both occasions she was so sleepy that she was not able to engage in a conversation.  Even with multiple prompts she would fall asleep before she could answer a single question.  Not sure why she is so sedated as she has not received any medication today.  Nevertheless we will have to defer further evaluation until she wakes up more.

## 2016-12-26 NOTE — ED Notes (Addendum)
BEHAVIORAL HEALTH ROUNDING Patient sleeping:  yes Patient alert and oriented:  Eyes closed  - sleeping Behavior appropriate: Yes.  ; If no, describe:  Nutrition and fluids offered: yes Toileting and hygiene offered: Yes  Sitter present: q15 minute observations and security monitoring Law enforcement present: Yes  ODS

## 2016-12-26 NOTE — ED Notes (Signed)
Pt sleeping when attempted to give dinner tray.  Did not want to wake up atr this time.  Tray placed at bedside

## 2016-12-26 NOTE — ED Notes (Signed)
Pt. To BHU from ED ambulatory without difficulty, to room  BHU2. Report from State Street CorporationVanessa RN. Pt. Is alert and oriented, warm and dry in no distress. Pt. Denies SI, HI, and AVH. Pt. Calm and cooperative. Pt states she just wants to go home. This Clinical research associatewriter advised patient that she will have to stay alert when she talks to Psych tomorrow for assessment. Pt. Made aware of security cameras and Q15 minute rounds. Pt. Encouraged to let Nursing staff know of any concerns or needs.

## 2016-12-26 NOTE — ED Notes (Signed)

## 2016-12-26 NOTE — ED Notes (Signed)
Breakfast provided along with 2 cups of water  Assessment completed  Plan of care including pending psych evaluation discussed  NAD observed  - pharmacy tech to update home meds - pt uses [facility] - Richard informed   Continue to monitor

## 2016-12-26 NOTE — ED Provider Notes (Signed)
-----------------------------------------   7:31 AM on 12/26/2016 -----------------------------------------   Height 5\' 3"  (1.6 m), weight 63.5 kg (140 lb).  The patient had no acute events since last update.  Calm and cooperative at this time.  Disposition is pending Psychiatry/Behavioral Medicine team recommendations.     Irean HongSung, Sherell Christoffel J, MD 12/26/16 [phone]

## 2016-12-26 NOTE — ED Notes (Signed)
Dr.Clapacc in room with patient ,patient going to sleep while doctor is talking ,  Doctor states he"ll come later when she's more awake.

## 2016-12-26 NOTE — ED Notes (Signed)
ED Is the patient under IVC or is there intent for IVC: Yes.   Is the patient medically cleared: Yes.   Is there vacancy in the ED BHU: Yes.   Is the population mix appropriate for patient: Yes.   Is the patient awaiting placement in inpatient or outpatient setting: Yes.   Has the patient had a psychiatric consult:   Consult pending Survey of unit performed for contraband, proper placement and condition of furniture, tampering with fixtures in bathroom, shower, and each patient room: Yes.  ; Findings:  APPEARANCE/BEHAVIOR  cooperative NEURO ASSESSMENT Orientation: oriented x3  Hallucinations: No.None noted (Hallucinations) Speech: Normal Gait: normal RESPIRATORY ASSESSMENT Even  Unlabored respirations  CARDIOVASCULAR ASSESSMENT Pulses equal   regular rate  Skin warm and dry   GASTROINTESTINAL ASSESSMENT no GI complaint EXTREMITIES Full ROM  PLAN OF CARE Provide calm/safe environment. Vital signs assessed twice daily. ED BHU Assessment once each 12-hour shift. Collaborate with TTS or as condition indicates. Assure the ED provider has rounded once each shift. Provide and encourage hygiene. Provide redirection as needed. Assess for escalating behavior; address immediately and inform ED provider.  Assess family dynamic and appropriateness for visitation as needed: Yes.  ; If necessary, describe findings:  Educate the patient/family about BHU procedures/visitation: Yes.  ; If necessary, describe findings:

## 2016-12-26 NOTE — ED Notes (Signed)

## 2016-12-26 NOTE — ED Notes (Signed)
Patient observed lying in bed with eyes closed  Even, unlabored respirations observed   NAD pt appears to be sleeping  I will continue to monitor along with every 15 minute visual observations and ongoing security monitoring    

## 2016-12-26 NOTE — ED Notes (Signed)
Pt up to restroom with steady gait, no distress noted. Pt able to speak in complete sentences.

## 2016-12-26 NOTE — Care Management (Signed)
Updated patient PTA medication history based off pharmacy records. Medications seem to have been filled 12/24/2016 but unclear whether the patient picked up or has actually taken the medications since that time.

## 2016-12-27 DIAGNOSIS — F1994 Other psychoactive substance use, unspecified with psychoactive substance-induced mood disorder: Secondary | ICD-10-CM | POA: Diagnosis not present
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
## 2016-12-27 NOTE — ED Notes (Signed)
Pt in her room awake, calm/cooperative at this time. Safety maintained. Will continue to monitor.    Jillyn HiddenStephanie Dunham [phone])  from [facility] called regarding pt's plan for discharge and reported that pt does have legal guardian named Ander SladeJoy [phone]) who is pt's aunt . Judeth CornfieldStephanie from [facility] requests that she be contacted on discharge so that she can follow up with patient as soon as possible.

## 2016-12-27 NOTE — ED Provider Notes (Signed)
-----------------------------------------   6:49 AM on 12/27/2016 -----------------------------------------   Blood pressure 138/90, pulse (!) 101, temperature 98 F (36.7 C), temperature source Oral, resp. rate 18, height 5\' 3"  (1.6 m), weight 63.5 kg (140 lb), SpO2 98 %.  The patient had no acute events since last update.  Sleeping at this time.  Disposition is pending Psychiatry/Behavioral Medicine team recommendations.     Irean HongSung, Wrigley Plasencia J, MD 12/27/16 [phone]

## 2016-12-27 NOTE — ED Notes (Signed)
Pt in her room awake, calm/cooperative. Safety maintained. Will continue to monitor.

## 2016-12-27 NOTE — ED Notes (Signed)
Provided and reviewed discharge paperwork, pt verbalized understanding. Belongings returned. Pt discharged with ACTT team member to transport her home. Safety maintained.

## 2016-12-27 NOTE — ED Notes (Signed)
Pt resting quietly in her room with even, unlabored respirations observed, eyes closed. Safety maintained with every 15 minute checks and security cameras in place. Will continue to monitor.

## 2016-12-27 NOTE — ED Notes (Signed)
Pt appears to be asleep in her room with even, unlabored respirations observed. Cooperative at this time. Safety maintained. Will continue to monitor.
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
## 2016-12-27 NOTE — ED Notes (Signed)
Pt awake in her room with TTS present. Calm/cooperative at this time. Safety maintained. Will continue to monitor.

## 2016-12-27 NOTE — ED Notes (Signed)
Pt awake in her bed requesting to go home. Discussed with patient that she would need to be awake and alert when doctor visits this afternoon in order to discuss discharge plans, pt verbalizes understanding. Calm and cooperative at this time. Food/fluids provided. Safety maintained with every 15 minute checks and security cameras in place. Will continue to monitor.

## 2016-12-27 NOTE — ED Notes (Signed)
Pt awake in her room. Just finished meeting with Dr. Toni Amendlapacs. Awaiting doctor orders. Pt currently calm/cooperative. Safety maintained. Will continue to monitor.

## 2016-12-27 NOTE — ED Provider Notes (Signed)
-----------------------------------------   3:37 PM on 12/27/2016 -----------------------------------------   Blood pressure (!) 132/98, pulse 97, temperature 98.4 F (36.9 C), temperature source Oral, resp. rate 18, height 5\' 3"  (1.6 m), weight 63.5 kg (140 lb), SpO2 100 %.  The patient had no acute events since last update.  Calm and cooperative at this time.  Patient clinically sober at this time.  Evaluated by Dr. Toni Amendlapacs who is recommending discharge and follow-up with the patient's act team.  The act team will be coming soon to pick the patient up.    Myrna BlazerSchaevitz, [name] Matthew, MD 12/27/16 1537
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
## 2017-02-03 ENCOUNTER — Other Ambulatory Visit: Payer: Self-pay

## 2017-02-03 ENCOUNTER — Emergency Department
Admission: EM | Admit: 2017-02-03 | Discharge: 2017-02-04 | Disposition: A | Discharge status: Home / Self Care | Payer: Medicare Other | Attending: Emergency Medicine | Admitting: Emergency Medicine

## 2017-02-03 ENCOUNTER — Encounter: Payer: Self-pay | Admitting: Emergency Medicine

## 2017-02-03 DIAGNOSIS — Z79899 Other long term (current) drug therapy: Secondary | ICD-10-CM | POA: Diagnosis not present

## 2017-02-03 DIAGNOSIS — F10120 Alcohol abuse with intoxication, uncomplicated: Secondary | ICD-10-CM | POA: Insufficient documentation

## 2017-02-03 DIAGNOSIS — G809 Cerebral palsy, unspecified: Secondary | ICD-10-CM | POA: Insufficient documentation

## 2017-02-03 DIAGNOSIS — F1010 Alcohol abuse, uncomplicated: Secondary | ICD-10-CM | POA: Diagnosis present

## 2017-02-03 DIAGNOSIS — Z21 Asymptomatic human immunodeficiency virus [HIV] infection status: Secondary | ICD-10-CM | POA: Insufficient documentation

## 2017-02-03 DIAGNOSIS — F1020 Alcohol dependence, uncomplicated: Secondary | ICD-10-CM | POA: Diagnosis present

## 2017-02-03 DIAGNOSIS — Z8673 Personal history of transient ischemic attack (TIA), and cerebral infarction without residual deficits: Secondary | ICD-10-CM | POA: Diagnosis not present

## 2017-02-03 DIAGNOSIS — F1420 Cocaine dependence, uncomplicated: Secondary | ICD-10-CM | POA: Insufficient documentation

## 2017-02-03 DIAGNOSIS — R451 Restlessness and agitation: Secondary | ICD-10-CM | POA: Diagnosis not present

## 2017-02-03 DIAGNOSIS — F10929 Alcohol use, unspecified with intoxication, unspecified: Secondary | ICD-10-CM

## 2017-02-03 DIAGNOSIS — F1994 Other psychoactive substance use, unspecified with psychoactive substance-induced mood disorder: Secondary | ICD-10-CM | POA: Diagnosis not present

## 2017-02-03 DIAGNOSIS — F209 Schizophrenia, unspecified: Secondary | ICD-10-CM | POA: Diagnosis not present

## 2017-02-03 DIAGNOSIS — F1210 Cannabis abuse, uncomplicated: Secondary | ICD-10-CM | POA: Diagnosis not present

## 2017-02-03 DIAGNOSIS — F250 Schizoaffective disorder, bipolar type: Secondary | ICD-10-CM | POA: Diagnosis present

## 2017-02-03 DIAGNOSIS — F17210 Nicotine dependence, cigarettes, uncomplicated: Secondary | ICD-10-CM | POA: Insufficient documentation

## 2017-02-03 LAB — CBC
HEMATOCRIT: 37.7 % (ref 35.0–47.0)
HEMOGLOBIN: 12.8 g/dL (ref 12.0–16.0)
MCH: 34.4 pg — AB (ref 26.0–34.0)
MCHC: 33.8 g/dL (ref 32.0–36.0)
MCV: 101.8 fL — ABNORMAL HIGH (ref 80.0–100.0)
Platelets: 121 10*3/uL — ABNORMAL LOW (ref 150–440)
RBC: 3.71 MIL/uL — ABNORMAL LOW (ref 3.80–5.20)
RDW: 13.3 % (ref 11.5–14.5)
WBC: 4.6 10*3/uL (ref 3.6–11.0)

## 2017-02-03 LAB — ETHANOL: Alcohol, Ethyl (B): 345 mg/dL (ref <10)

## 2017-02-03 LAB — COMPREHENSIVE METABOLIC PANEL
ALBUMIN: 4.1 g/dL (ref 3.5–5.0)
ALT: 71 U/L — AB (ref 14–54)
AST: 87 U/L — AB (ref 15–41)
Alkaline Phosphatase: 69 U/L (ref 38–126)
Anion gap: 12 (ref 5–15)
BILIRUBIN TOTAL: 0.3 mg/dL (ref 0.3–1.2)
BUN: 7 mg/dL (ref 6–20)
CHLORIDE: 106 mmol/L (ref 101–111)
CO2: 22 mmol/L (ref 22–32)
CREATININE: 0.68 mg/dL (ref 0.44–1.00)
Calcium: 9 mg/dL (ref 8.9–10.3)
GFR calc Af Amer: >60 mL/min (ref >60)
GFR calc non Af Amer: >60 mL/min (ref >60)
GLUCOSE: 94 mg/dL (ref 65–99)
POTASSIUM: 3.5 mmol/L (ref 3.5–5.1)
Sodium: 140 mmol/L (ref 135–145)
Total Protein: 7.5 g/dL (ref 6.5–8.1)

## 2017-02-03 LAB — URINE DRUG SCREEN, QUALITATIVE (ARMC ONLY)
Amphetamines, Ur Screen: NOT DETECTED
BARBITURATES, UR SCREEN: NOT DETECTED
BENZODIAZEPINE, UR SCRN: NOT DETECTED
CANNABINOID 50 NG, UR ~~LOC~~: NOT DETECTED
COCAINE METABOLITE, UR ~~LOC~~: NOT DETECTED
MDMA (Ecstasy)Ur Screen: NOT DETECTED
Methadone Scn, Ur: NOT DETECTED
OPIATE, UR SCREEN: NOT DETECTED
PHENCYCLIDINE (PCP) UR S: NOT DETECTED
Tricyclic, Ur Screen: NOT DETECTED

## 2017-02-03 MED ORDER — ZIPRASIDONE MESYLATE 20 MG IM SOLR
20.0000 mg | Freq: Once | INTRAMUSCULAR | Status: AC
  Administered 2017-02-03: 20 mg via INTRAMUSCULAR

## 2017-02-03 MED ORDER — ZIPRASIDONE MESYLATE 20 MG IM SOLR
INTRAMUSCULAR | Status: AC
  Administered 2017-02-03: 20 mg via INTRAMUSCULAR
  Filled 2017-02-03: qty 20

## 2017-02-03 NOTE — BH Assessment (Addendum)
This Clinical research associatewriter called Judeth CornfieldStephanie ACTT Team Lead [facility] [phone] to [facility] due to patient being sedated. Was able to get information about patients mental health history but not what led up to her being hospitalized.
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
## 2017-02-03 NOTE — ED Notes (Signed)
Pt assisted to the toilet in room. Pt demands I get the doctor and get her ativan, "now" and bring her constant water.

## 2017-02-03 NOTE — Consult Note (Signed)
Psychiatry: Attempts made to talk with the patient today.  Chart reviewed.  She remains sound asleep at this point.  We will reevaluate tomorrow.

## 2017-02-03 NOTE — ED Notes (Signed)
Pt sleeping. No acute distress. Bedside monitor on. See flowsheet.

## 2017-02-03 NOTE — ED Notes (Addendum)
S/p geodon and talking to pt for 15 min pt finally said that she would not act out. Handcuffs removed by [employer] and the officers left. Pt resting on stretcher at this time

## 2017-02-03 NOTE — ED Notes (Signed)
Pt sleeping. Vs wnl. NAD.

## 2017-02-03 NOTE — ED Notes (Signed)
Pt is sedated and sleeping. Unable to assess Pt or change clothes to hospital paper scrubs.

## 2017-02-03 NOTE — ED Triage Notes (Signed)
Pt presents in custody of [employer] yelling and cursing. ACT called [employer] because pt is apparently having difficulty with etoh, not taking medications, not eating. Pt unable to assist with triage.

## 2017-02-04 DIAGNOSIS — F1994 Other psychoactive substance use, unspecified with psychoactive substance-induced mood disorder: Secondary | ICD-10-CM

## 2017-02-04 DIAGNOSIS — F10120 Alcohol abuse with intoxication, uncomplicated: Secondary | ICD-10-CM | POA: Diagnosis not present

## 2017-02-04 LAB — PREGNANCY, URINE: PREG TEST UR: NEGATIVE

## 2017-02-04 MED ORDER — NICOTINE POLACRILEX 2 MG MT GUM
2.0000 mg | CHEWING_GUM | OROMUCOSAL | Status: DC | PRN
  Filled 2017-02-04: qty 1

## 2017-02-04 MED ORDER — LORAZEPAM 2 MG/ML IJ SOLN
INTRAMUSCULAR | Status: AC
  Filled 2017-02-04: qty 1

## 2017-02-04 MED ORDER — ONDANSETRON HCL 4 MG PO TABS: 4.0000 mg | ORAL_TABLET | Freq: Three times a day (TID) | ORAL | Status: DC | PRN

## 2017-02-04 MED ORDER — LORAZEPAM 2 MG PO TABS
0.0000 mg | ORAL_TABLET | Freq: Two times a day (BID) | ORAL | Status: DC
Start: 2017-02-06 — End: 2017-02-04

## 2017-02-04 MED ORDER — HYDROXYZINE HCL 25 MG PO TABS
50.0000 mg | ORAL_TABLET | Freq: Three times a day (TID) | ORAL | Status: DC
  Administered 2017-02-04: 50 mg via ORAL
  Filled 2017-02-04 (×2): qty 2

## 2017-02-04 MED ORDER — DOLUTEGRAVIR SODIUM 50 MG PO TABS: 50.0000 mg | ORAL_TABLET | Freq: Every day | ORAL | Status: DC

## 2017-02-04 MED ORDER — LORAZEPAM 2 MG/ML IJ SOLN
2.0000 mg | Freq: Once | INTRAMUSCULAR | Status: AC
  Administered 2017-02-04: 2 mg via INTRAMUSCULAR

## 2017-02-04 MED ORDER — CITALOPRAM HYDROBROMIDE 20 MG PO TABS
20.0000 mg | ORAL_TABLET | Freq: Every day | ORAL | Status: DC
  Filled 2017-02-04: qty 1

## 2017-02-04 MED ORDER — EMTRICITABINE-TENOFOVIR AF 200-25 MG PO TABS: 1.0000 | ORAL_TABLET | Freq: Every day | ORAL | Status: DC

## 2017-02-04 MED ORDER — IBUPROFEN 800 MG PO TABS
400.0000 mg | ORAL_TABLET | Freq: Three times a day (TID) | ORAL | Status: DC
  Administered 2017-02-04: 400 mg via ORAL
  Filled 2017-02-04 (×2): qty 1

## 2017-02-04 MED ORDER — GABAPENTIN 300 MG PO CAPS
400.0000 mg | ORAL_CAPSULE | Freq: Every day | ORAL | Status: DC
  Filled 2017-02-04: qty 1

## 2017-02-04 MED ORDER — BACLOFEN 10 MG PO TABS
10.0000 mg | ORAL_TABLET | Freq: Three times a day (TID) | ORAL | Status: DC
  Filled 2017-02-04 (×2): qty 1

## 2017-02-04 MED ORDER — BENZTROPINE MESYLATE 1 MG PO TABS: 0.5000 mg | ORAL_TABLET | Freq: Every day | ORAL | Status: DC

## 2017-02-04 MED ORDER — THIAMINE HCL 100 MG/ML IJ SOLN: 100.0000 mg | Freq: Every day | INTRAMUSCULAR | Status: DC

## 2017-02-04 MED ORDER — LEVETIRACETAM 250 MG PO TABS
250.0000 mg | ORAL_TABLET | Freq: Every day | ORAL | Status: DC
  Filled 2017-02-04: qty 1

## 2017-02-04 MED ORDER — GABAPENTIN 300 MG PO CAPS
300.0000 mg | ORAL_CAPSULE | Freq: Two times a day (BID) | ORAL | Status: DC
  Administered 2017-02-04: 300 mg via ORAL
  Filled 2017-02-04 (×2): qty 1

## 2017-02-04 MED ORDER — LORAZEPAM 2 MG/ML IJ SOLN: 0.0000 mg | Freq: Four times a day (QID) | INTRAMUSCULAR | Status: DC

## 2017-02-04 MED ORDER — LORAZEPAM 2 MG/ML IJ SOLN
0.0000 mg | Freq: Two times a day (BID) | INTRAMUSCULAR | Status: DC
Start: 2017-02-06 — End: 2017-02-04

## 2017-02-04 MED ORDER — METHOCARBAMOL 500 MG PO TABS
500.0000 mg | ORAL_TABLET | Freq: Two times a day (BID) | ORAL | Status: DC | PRN
  Filled 2017-02-04: qty 1

## 2017-02-04 MED ORDER — ACETAMINOPHEN 325 MG PO TABS: 650.0000 mg | ORAL_TABLET | ORAL | Status: DC | PRN

## 2017-02-04 MED ORDER — VITAMIN B-1 100 MG PO TABS
100.0000 mg | ORAL_TABLET | Freq: Every day | ORAL | Status: DC
  Administered 2017-02-04: 100 mg via ORAL
  Filled 2017-02-04: qty 1

## 2017-02-04 MED ORDER — LORAZEPAM 2 MG PO TABS
0.0000 mg | ORAL_TABLET | Freq: Four times a day (QID) | ORAL | Status: DC
Start: 2017-02-04 — End: 2017-02-04
  Administered 2017-02-04: 2 mg via ORAL
  Filled 2017-02-04: qty 1

## 2017-02-04 NOTE — ED Notes (Signed)
Pt awake and eating.

## 2017-02-04 NOTE — ED Notes (Signed)
Pt walked independently to the bathroom with a steady gait.

## 2017-02-04 NOTE — ED Notes (Signed)
Pt given 2mg  Ativan IM per MD order for agitation.[facility]

## 2017-02-04 NOTE — ED Notes (Signed)
Sylvia ArtisGuardian Joy [name] at [phone] and Sylvia [name] from pt's ACTT team were made aware of pending discharge. Ms. Sylvia [name] says she is on her way to pick up pt in about 35 minutes.

## 2017-02-04 NOTE — ED Notes (Signed)
Pt states that the only medications she takes is ativan, keppra, gabapentin.  Pt hx reports HIV, notes in chart report as not HIV positive. Pt does not take HIV medications; pharmacy reports that they were only taken once and patient does not taken daily.

## 2017-02-04 NOTE — ED Notes (Signed)
[facility] officer and ED tech.

## 2017-02-04 NOTE — Discharge Instructions (Signed)
You have been seen in the emergency department for a  psychiatric concern. You have been evaluated both medically as well as psychiatrically. Please follow-up with your outpatient resources provided. Return to the emergency department for any worsening symptoms, or any thoughts of hurting yourself or anyone else so that we may attempt to help you. 

## 2017-02-04 NOTE — ED Notes (Signed)
Pt was discharged to guardian [place]. Pt received AVS and belongings. Pt was ambulatory and in no acute distress at discharge. Pt was escorted to lobby.
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
## 2017-02-04 NOTE — ED Notes (Signed)
TTS at bedside. 

## 2017-02-04 NOTE — ED Notes (Signed)
Pt informed of move to [facility]. Pt agreeable.

## 2017-02-04 NOTE — ED Notes (Signed)
Pt woke up coughing. Asked pt if she would like me to see if I could get something for her cough pt's response, "I'm going through withdrawals dumb ass."

## 2017-02-04 NOTE — ED Notes (Signed)
Pt is calm and no sings of distress.

## 2017-02-04 NOTE — ED Provider Notes (Signed)
-----------------------------------------   3:50 PM on 02/04/2017 -----------------------------------------  Patient has been seen and evaluated by psychiatry and is believed to be safe for discharge home.  Patient has an outpatient ACT team.  Patient's medical workup has been largely nonrevealing besides an elevated ethanol level upon arrival yesterday.  We will discharge at this time with outpatient follow-up.   Minna AntisPaduchowski, Erilyn Pearman, MD 02/04/17 [phone]

## 2017-02-04 NOTE — ED Notes (Signed)
Pt states "I feel like I'm about to have a seizure". RN made aware.
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
## 2017-02-04 NOTE — ED Notes (Addendum)
[facility]die BraunKaren[facility].

## 2017-04-19 ENCOUNTER — Other Ambulatory Visit: Payer: Self-pay

## 2017-04-19 ENCOUNTER — Emergency Department
Admission: EM | Admit: 2017-04-19 | Discharge: 2017-04-19 | Payer: Medicare Other | Attending: Emergency Medicine | Admitting: Emergency Medicine

## 2017-04-19 ENCOUNTER — Encounter: Payer: Self-pay | Admitting: Emergency Medicine

## 2017-04-19 DIAGNOSIS — R4182 Altered mental status, unspecified: Secondary | ICD-10-CM | POA: Diagnosis present

## 2017-04-19 DIAGNOSIS — Z79899 Other long term (current) drug therapy: Secondary | ICD-10-CM | POA: Insufficient documentation

## 2017-04-19 DIAGNOSIS — B20 Human immunodeficiency virus [HIV] disease: Secondary | ICD-10-CM | POA: Diagnosis not present

## 2017-04-19 DIAGNOSIS — F1910 Other psychoactive substance abuse, uncomplicated: Secondary | ICD-10-CM

## 2017-04-19 DIAGNOSIS — Z89411 Acquired absence of right great toe: Secondary | ICD-10-CM | POA: Diagnosis not present

## 2017-04-19 DIAGNOSIS — F17210 Nicotine dependence, cigarettes, uncomplicated: Secondary | ICD-10-CM | POA: Insufficient documentation

## 2017-04-19 DIAGNOSIS — F10920 Alcohol use, unspecified with intoxication, uncomplicated: Secondary | ICD-10-CM | POA: Diagnosis not present

## 2017-04-19 LAB — COMPREHENSIVE METABOLIC PANEL
ALK PHOS: 72 U/L (ref 38–126)
ALT: 54 U/L (ref 14–54)
AST: 77 U/L — ABNORMAL HIGH (ref 15–41)
Albumin: 4.7 g/dL (ref 3.5–5.0)
Anion gap: 15 (ref 5–15)
BILIRUBIN TOTAL: 0.7 mg/dL (ref 0.3–1.2)
BUN: 11 mg/dL (ref 6–20)
CALCIUM: 8.8 mg/dL — AB (ref 8.9–10.3)
CO2: 19 mmol/L — AB (ref 22–32)
CREATININE: 0.75 mg/dL (ref 0.44–1.00)
Chloride: 107 mmol/L (ref 101–111)
Glucose, Bld: 83 mg/dL (ref 65–99)
Potassium: 3.6 mmol/L (ref 3.5–5.1)
SODIUM: 141 mmol/L (ref 135–145)
TOTAL PROTEIN: 8.3 g/dL — AB (ref 6.5–8.1)

## 2017-04-19 LAB — ETHANOL: ALCOHOL ETHYL (B): 346 mg/dL — AB (ref <10)

## 2017-04-19 LAB — CBC
HCT: 38.8 % (ref 35.0–47.0)
Hemoglobin: 13.3 g/dL (ref 12.0–16.0)
MCH: 34.4 pg — ABNORMAL HIGH (ref 26.0–34.0)
MCHC: 34.4 g/dL (ref 32.0–36.0)
MCV: 100 fL (ref 80.0–100.0)
PLATELETS: 241 10*3/uL (ref 150–440)
RBC: 3.87 MIL/uL (ref 3.80–5.20)
RDW: 13.3 % (ref 11.5–14.5)
WBC: 8.7 10*3/uL (ref 3.6–11.0)

## 2017-04-19 LAB — SALICYLATE LEVEL

## 2017-04-19 LAB — ACETAMINOPHEN LEVEL

## 2017-04-19 LAB — HCG, QUANTITATIVE, PREGNANCY: HCG, BETA CHAIN, QUANT, S: 1 m[IU]/mL (ref <5)
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
## 2017-04-19 NOTE — ED Notes (Signed)
Patient discharged with two officers.

## 2017-04-19 NOTE — ED Notes (Signed)
Female ED tech at bedside along with [employer] Hines.

## 2017-04-19 NOTE — ED Notes (Signed)
Dr. Scotty CourtStafford aware of blood alcohol level of 346.

## 2017-04-19 NOTE — ED Notes (Signed)
Patient remains agitated, yelling out, swearing. Patient will calm down for a short amount of time.

## 2017-04-19 NOTE — ED Triage Notes (Signed)
Patient presents to Emergency Department via [employer] [facility] with complaints of "being too high and too drunk"  Pt came in handcuffs and spit shield/hood.  Pt is screaming and yelling, uncooperative and inappropriate.

## 2017-04-19 NOTE — Discharge Instructions (Signed)
Your tests today were unremarkable. Your pregnancy test was negative.   You are medically stable and clear to proceed to jail at this time.

## 2017-09-20 ENCOUNTER — Encounter

## 2017-11-22 ENCOUNTER — Emergency Department: Payer: Medicare Other

## 2017-11-22 ENCOUNTER — Observation Stay: Payer: Medicare Other | Admitting: Certified Registered Nurse Anesthetist

## 2017-11-22 ENCOUNTER — Encounter
Admission: EM | Disposition: A | Discharge status: Home / Self Care | Payer: Self-pay | Source: Home / Self Care | Attending: Emergency Medicine

## 2017-11-22 ENCOUNTER — Other Ambulatory Visit: Payer: Self-pay

## 2017-11-22 ENCOUNTER — Observation Stay
Admission: EM | Admit: 2017-11-22 | Discharge: 2017-11-24 | Disposition: A | Discharge status: Home / Self Care | Payer: Medicare Other | Attending: Orthopedic Surgery | Admitting: Orthopedic Surgery

## 2017-11-22 ENCOUNTER — Observation Stay: Payer: Medicare Other

## 2017-11-22 DIAGNOSIS — Z888 Allergy status to other drugs, medicaments and biological substances status: Secondary | ICD-10-CM | POA: Diagnosis not present

## 2017-11-22 DIAGNOSIS — Y92003 Bedroom of unspecified non-institutional (private) residence as the place of occurrence of the external cause: Secondary | ICD-10-CM | POA: Insufficient documentation

## 2017-11-22 DIAGNOSIS — F17210 Nicotine dependence, cigarettes, uncomplicated: Secondary | ICD-10-CM | POA: Diagnosis not present

## 2017-11-22 DIAGNOSIS — F250 Schizoaffective disorder, bipolar type: Secondary | ICD-10-CM | POA: Diagnosis not present

## 2017-11-22 DIAGNOSIS — W06XXXA Fall from bed, initial encounter: Secondary | ICD-10-CM | POA: Insufficient documentation

## 2017-11-22 DIAGNOSIS — F1020 Alcohol dependence, uncomplicated: Secondary | ICD-10-CM | POA: Diagnosis not present

## 2017-11-22 DIAGNOSIS — S52502A Unspecified fracture of the lower end of left radius, initial encounter for closed fracture: Secondary | ICD-10-CM | POA: Diagnosis present

## 2017-11-22 DIAGNOSIS — R531 Weakness: Secondary | ICD-10-CM | POA: Insufficient documentation

## 2017-11-22 DIAGNOSIS — Z79899 Other long term (current) drug therapy: Secondary | ICD-10-CM | POA: Insufficient documentation

## 2017-11-22 DIAGNOSIS — F1994 Other psychoactive substance use, unspecified with psychoactive substance-induced mood disorder: Secondary | ICD-10-CM | POA: Insufficient documentation

## 2017-11-22 DIAGNOSIS — G40909 Epilepsy, unspecified, not intractable, without status epilepticus: Secondary | ICD-10-CM | POA: Diagnosis not present

## 2017-11-22 DIAGNOSIS — S52612A Displaced fracture of left ulna styloid process, initial encounter for closed fracture: Secondary | ICD-10-CM | POA: Diagnosis not present

## 2017-11-22 DIAGNOSIS — S52292A Other fracture of shaft of left ulna, initial encounter for closed fracture: Secondary | ICD-10-CM | POA: Diagnosis not present

## 2017-11-22 DIAGNOSIS — S59292A Other physeal fracture of lower end of radius, left arm, initial encounter for closed fracture: Secondary | ICD-10-CM | POA: Diagnosis not present

## 2017-11-22 DIAGNOSIS — F419 Anxiety disorder, unspecified: Secondary | ICD-10-CM | POA: Diagnosis not present

## 2017-11-22 DIAGNOSIS — R4183 Borderline intellectual functioning: Secondary | ICD-10-CM | POA: Insufficient documentation

## 2017-11-22 DIAGNOSIS — Z8673 Personal history of transient ischemic attack (TIA), and cerebral infarction without residual deficits: Secondary | ICD-10-CM | POA: Insufficient documentation

## 2017-11-22 DIAGNOSIS — G809 Cerebral palsy, unspecified: Secondary | ICD-10-CM | POA: Insufficient documentation

## 2017-11-22 DIAGNOSIS — S5290XA Unspecified fracture of unspecified forearm, initial encounter for closed fracture: Secondary | ICD-10-CM | POA: Diagnosis present

## 2017-11-22 DIAGNOSIS — B1920 Unspecified viral hepatitis C without hepatic coma: Secondary | ICD-10-CM | POA: Diagnosis not present

## 2017-11-22 HISTORY — DX: Other psychoactive substance abuse, uncomplicated: F19.10

## 2017-11-22 HISTORY — PX: OPEN REDUCTION INTERNAL FIXATION (ORIF) DISTAL RADIAL FRACTURE: SHX5989

## 2017-11-22 HISTORY — DX: Alcohol dependence, uncomplicated: F10.20

## 2017-11-22 LAB — URINE DRUG SCREEN, QUALITATIVE (ARMC ONLY)
AMPHETAMINES, UR SCREEN: NOT DETECTED
BARBITURATES, UR SCREEN: NOT DETECTED
BENZODIAZEPINE, UR SCRN: NOT DETECTED
Cannabinoid 50 Ng, Ur ~~LOC~~: NOT DETECTED
Cocaine Metabolite,Ur ~~LOC~~: NOT DETECTED
MDMA (Ecstasy)Ur Screen: NOT DETECTED
Methadone Scn, Ur: NOT DETECTED
OPIATE, UR SCREEN: POSITIVE — AB
PHENCYCLIDINE (PCP) UR S: NOT DETECTED
Tricyclic, Ur Screen: NOT DETECTED

## 2017-11-22 LAB — CBC
HCT: 33.2 % — ABNORMAL LOW (ref 35.0–47.0)
Hemoglobin: 11.8 g/dL — ABNORMAL LOW (ref 12.0–16.0)
MCH: 33.7 pg (ref 26.0–34.0)
MCHC: 35.4 g/dL (ref 32.0–36.0)
MCV: 95.3 fL (ref 80.0–100.0)
Platelets: 172 10*3/uL (ref 150–440)
RBC: 3.48 MIL/uL — AB (ref 3.80–5.20)
RDW: 13 % (ref 11.5–14.5)
WBC: 9 10*3/uL (ref 3.6–11.0)

## 2017-11-22 LAB — BASIC METABOLIC PANEL
ANION GAP: 12 (ref 5–15)
BUN: 7 mg/dL (ref 6–20)
CALCIUM: 8.7 mg/dL — AB (ref 8.9–10.3)
CO2: 24 mmol/L (ref 22–32)
Chloride: 98 mmol/L (ref 98–111)
Creatinine, Ser: 0.69 mg/dL (ref 0.44–1.00)
GFR calc Af Amer: >60 mL/min (ref >60)
GLUCOSE: 90 mg/dL (ref 70–99)
Potassium: 4.6 mmol/L (ref 3.5–5.1)
SODIUM: 134 mmol/L — AB (ref 135–145)

## 2017-11-22 LAB — PROTIME-INR
INR: 0.94
Prothrombin Time: 12.5 seconds (ref 11.4–15.2)

## 2017-11-22 LAB — SURGICAL PCR SCREEN
MRSA, PCR: NEGATIVE
STAPHYLOCOCCUS AUREUS: NEGATIVE

## 2017-11-22 LAB — ETHANOL

## 2017-11-22 LAB — PREGNANCY, URINE: PREG TEST UR: NEGATIVE

## 2017-11-22 LAB — APTT: aPTT: 29 seconds (ref 24–36)

## 2017-11-22 SURGERY — OPEN REDUCTION INTERNAL FIXATION (ORIF) DISTAL RADIUS FRACTURE
Anesthesia: General | Laterality: Left

## 2017-11-22 MED ORDER — HYDROMORPHONE HCL 1 MG/ML IJ SOLN
0.2500 mg | INTRAMUSCULAR | Status: DC | PRN
  Administered 2017-11-22 (×4): 0.25 mg via INTRAVENOUS

## 2017-11-22 MED ORDER — SUCCINYLCHOLINE CHLORIDE 20 MG/ML IJ SOLN
INTRAMUSCULAR | Status: DC | PRN
  Administered 2017-11-22: 80 mg via INTRAVENOUS

## 2017-11-22 MED ORDER — NICOTINE 21 MG/24HR TD PT24
21.0000 mg | MEDICATED_PATCH | Freq: Every day | TRANSDERMAL | Status: DC
  Administered 2017-11-23 – 2017-11-24 (×2): 21 mg via TRANSDERMAL
  Filled 2017-11-22 (×2): qty 1

## 2017-11-22 MED ORDER — HYDROMORPHONE HCL 1 MG/ML IJ SOLN: 0.2500 mg | INTRAMUSCULAR | Status: DC | PRN

## 2017-11-22 MED ORDER — FENTANYL CITRATE (PF) 100 MCG/2ML IJ SOLN
INTRAMUSCULAR | Status: AC
  Filled 2017-11-22: qty 2

## 2017-11-22 MED ORDER — KETOROLAC TROMETHAMINE 30 MG/ML IJ SOLN
INTRAMUSCULAR | Status: DC | PRN
  Administered 2017-11-22: 30 mg via INTRAVENOUS

## 2017-11-22 MED ORDER — ONDANSETRON HCL 4 MG/2ML IJ SOLN
INTRAMUSCULAR | Status: DC | PRN
  Administered 2017-11-22: 4 mg via INTRAVENOUS

## 2017-11-22 MED ORDER — SODIUM CHLORIDE 0.9 % IV SOLN
INTRAVENOUS | Status: DC
  Administered 2017-11-22 – 2017-11-24 (×4): via INTRAVENOUS

## 2017-11-22 MED ORDER — POLYETHYLENE GLYCOL 3350 17 G PO PACK: 17.0000 g | PACK | Freq: Every day | ORAL | Status: DC | PRN

## 2017-11-22 MED ORDER — SUCCINYLCHOLINE CHLORIDE 20 MG/ML IJ SOLN
INTRAMUSCULAR | Status: AC
  Filled 2017-11-22: qty 1

## 2017-11-22 MED ORDER — VITAMIN D 1000 UNITS PO TABS
1000.0000 [IU] | ORAL_TABLET | Freq: Every day | ORAL | Status: DC
  Administered 2017-11-23 – 2017-11-24 (×2): 1000 [IU] via ORAL
  Filled 2017-11-22 (×2): qty 1

## 2017-11-22 MED ORDER — CEFAZOLIN SODIUM-DEXTROSE 2-4 GM/100ML-% IV SOLN
2.0000 g | INTRAVENOUS | Status: AC
  Administered 2017-11-22: 2 g via INTRAVENOUS
  Filled 2017-11-22: qty 100

## 2017-11-22 MED ORDER — SODIUM CHLORIDE 0.9 % IV SOLN
250.0000 mg | Freq: Once | INTRAVENOUS | Status: AC
  Administered 2017-11-22: 250 mg via INTRAVENOUS
  Filled 2017-11-22: qty 2.5

## 2017-11-22 MED ORDER — PERPHENAZINE 4 MG PO TABS
2.0000 mg | ORAL_TABLET | Freq: Every morning | ORAL | Status: DC
  Filled 2017-11-22: qty 1

## 2017-11-22 MED ORDER — HYDROXYZINE HCL 25 MG PO TABS
25.0000 mg | ORAL_TABLET | Freq: Three times a day (TID) | ORAL | Status: DC | PRN
  Filled 2017-11-22: qty 1

## 2017-11-22 MED ORDER — BENZTROPINE MESYLATE 0.5 MG PO TABS
0.5000 mg | ORAL_TABLET | Freq: Every day | ORAL | Status: DC
  Administered 2017-11-22: 0.5 mg via ORAL
  Filled 2017-11-22 (×3): qty 1

## 2017-11-22 MED ORDER — FENTANYL CITRATE (PF) 250 MCG/5ML IJ SOLN
INTRAMUSCULAR | Status: AC
  Filled 2017-11-22: qty 5

## 2017-11-22 MED ORDER — ACETAMINOPHEN 10 MG/ML IV SOLN
INTRAVENOUS | Status: AC
  Filled 2017-11-22: qty 100

## 2017-11-22 MED ORDER — PROPOFOL 10 MG/ML IV BOLUS
INTRAVENOUS | Status: DC | PRN
  Administered 2017-11-22: 140 mg via INTRAVENOUS

## 2017-11-22 MED ORDER — LIDOCAINE HCL (CARDIAC) PF 100 MG/5ML IV SOSY
PREFILLED_SYRINGE | INTRAVENOUS | Status: DC | PRN
  Administered 2017-11-22: 60 mg via INTRAVENOUS

## 2017-11-22 MED ORDER — PROPOFOL 10 MG/ML IV BOLUS
INTRAVENOUS | Status: AC
  Filled 2017-11-22: qty 20

## 2017-11-22 MED ORDER — DOCUSATE SODIUM 100 MG PO CAPS
100.0000 mg | ORAL_CAPSULE | Freq: Two times a day (BID) | ORAL | Status: DC
  Administered 2017-11-22 – 2017-11-24 (×4): 100 mg via ORAL
  Filled 2017-11-22 (×4): qty 1

## 2017-11-22 MED ORDER — CITALOPRAM HYDROBROMIDE 20 MG PO TABS
20.0000 mg | ORAL_TABLET | Freq: Every day | ORAL | Status: DC
  Administered 2017-11-23 – 2017-11-24 (×2): 20 mg via ORAL
  Filled 2017-11-22 (×2): qty 1

## 2017-11-22 MED ORDER — ONDANSETRON HCL 4 MG/2ML IJ SOLN: 4.0000 mg | Freq: Four times a day (QID) | INTRAMUSCULAR | Status: DC | PRN

## 2017-11-22 MED ORDER — MORPHINE SULFATE (PF) 4 MG/ML IV SOLN
INTRAVENOUS | Status: AC
  Administered 2017-11-22: 4 mg via INTRAVENOUS
  Filled 2017-11-22: qty 1

## 2017-11-22 MED ORDER — OXYCODONE HCL 5 MG PO TABS: 5.0000 mg | ORAL_TABLET | ORAL | Status: DC | PRN

## 2017-11-22 MED ORDER — CEFAZOLIN SODIUM-DEXTROSE 1-4 GM/50ML-% IV SOLN
1.0000 g | Freq: Four times a day (QID) | INTRAVENOUS | Status: AC
  Administered 2017-11-22 – 2017-11-23 (×3): 1 g via INTRAVENOUS
  Filled 2017-11-22 (×5): qty 50

## 2017-11-22 MED ORDER — LIDOCAINE HCL (PF) 2 % IJ SOLN
INTRAMUSCULAR | Status: AC
  Filled 2017-11-22: qty 10

## 2017-11-22 MED ORDER — METHOCARBAMOL 500 MG PO TABS: 500.0000 mg | ORAL_TABLET | Freq: Four times a day (QID) | ORAL | Status: DC | PRN

## 2017-11-22 MED ORDER — HYDROMORPHONE HCL 1 MG/ML IJ SOLN
INTRAMUSCULAR | Status: AC
  Administered 2017-11-22: 0.25 mg via INTRAVENOUS
  Filled 2017-11-22: qty 1

## 2017-11-22 MED ORDER — SEVOFLURANE IN SOLN
RESPIRATORY_TRACT | Status: AC
  Filled 2017-11-22: qty 250

## 2017-11-22 MED ORDER — LEVETIRACETAM 250 MG PO TABS
250.0000 mg | ORAL_TABLET | Freq: Two times a day (BID) | ORAL | Status: DC
  Administered 2017-11-22 – 2017-11-24 (×4): 250 mg via ORAL
  Filled 2017-11-22 (×5): qty 1

## 2017-11-22 MED ORDER — DEXAMETHASONE SODIUM PHOSPHATE 10 MG/ML IJ SOLN
INTRAMUSCULAR | Status: DC | PRN
  Administered 2017-11-22: 10 mg via INTRAVENOUS

## 2017-11-22 MED ORDER — FENTANYL CITRATE (PF) 100 MCG/2ML IJ SOLN
INTRAMUSCULAR | Status: DC | PRN
  Administered 2017-11-22: 25 ug via INTRAVENOUS
  Administered 2017-11-22: 50 ug via INTRAVENOUS
  Administered 2017-11-22 (×3): 25 ug via INTRAVENOUS
  Administered 2017-11-22: 100 ug via INTRAVENOUS
  Administered 2017-11-22: 50 ug via INTRAVENOUS

## 2017-11-22 MED ORDER — MORPHINE SULFATE (PF) 4 MG/ML IV SOLN
4.0000 mg | Freq: Once | INTRAVENOUS | Status: AC
  Administered 2017-11-22: 4 mg via INTRAVENOUS

## 2017-11-22 MED ORDER — PHENYLEPHRINE HCL 10 MG/ML IJ SOLN
INTRAMUSCULAR | Status: DC | PRN
  Administered 2017-11-22 (×3): 50 ug via INTRAVENOUS

## 2017-11-22 MED ORDER — BUPIVACAINE HCL (PF) 0.5 % IJ SOLN
INTRAMUSCULAR | Status: AC
  Filled 2017-11-22: qty 30

## 2017-11-22 MED ORDER — ACETAMINOPHEN 10 MG/ML IV SOLN
INTRAVENOUS | Status: DC | PRN
  Administered 2017-11-22: 1000 mg via INTRAVENOUS

## 2017-11-22 MED ORDER — MIDAZOLAM HCL 2 MG/2ML IJ SOLN
INTRAMUSCULAR | Status: DC | PRN
  Administered 2017-11-22: 2 mg via INTRAVENOUS

## 2017-11-22 MED ORDER — VITAMIN D (ERGOCALCIFEROL) 1.25 MG (50000 UNIT) PO CAPS: 1000.0000 [IU] | ORAL_CAPSULE | Freq: Every day | ORAL | Status: DC

## 2017-11-22 MED ORDER — PERPHENAZINE 2 MG PO TABS
6.0000 mg | ORAL_TABLET | Freq: Every day | ORAL | Status: DC
  Filled 2017-11-22 (×2): qty 3

## 2017-11-22 MED ORDER — HYDROMORPHONE HCL 1 MG/ML IJ SOLN: 0.5000 mg | INTRAMUSCULAR | Status: DC | PRN

## 2017-11-22 MED ORDER — METHOCARBAMOL 1000 MG/10ML IJ SOLN
500.0000 mg | Freq: Four times a day (QID) | INTRAVENOUS | Status: DC | PRN
  Filled 2017-11-22: qty 5

## 2017-11-22 MED ORDER — OXYCODONE HCL 5 MG PO TABS
5.0000 mg | ORAL_TABLET | ORAL | Status: DC | PRN
  Administered 2017-11-22: 5 mg via ORAL
  Administered 2017-11-22 – 2017-11-23 (×5): 10 mg via ORAL
  Administered 2017-11-24 (×2): 5 mg via ORAL
  Filled 2017-11-22: qty 1
  Filled 2017-11-22 (×3): qty 2
  Filled 2017-11-22: qty 1
  Filled 2017-11-22: qty 2
  Filled 2017-11-22: qty 1
  Filled 2017-11-22: qty 2
  Filled 2017-11-22: qty 1

## 2017-11-22 MED ORDER — SODIUM CHLORIDE 0.9 % IV SOLN
INTRAVENOUS | Status: DC
  Administered 2017-11-22 (×2): via INTRAVENOUS

## 2017-11-22 MED ORDER — BISACODYL 5 MG PO TBEC
10.0000 mg | DELAYED_RELEASE_TABLET | Freq: Every day | ORAL | Status: DC | PRN
  Administered 2017-11-24: 10 mg via ORAL
  Filled 2017-11-22: qty 2

## 2017-11-22 MED ORDER — NEOMYCIN-POLYMYXIN B GU 40-200000 IR SOLN
Status: AC
  Filled 2017-11-22: qty 2

## 2017-11-22 MED ORDER — SODIUM CHLORIDE 0.9 % IV SOLN
Freq: Once | INTRAVENOUS | Status: AC
  Administered 2017-11-22: 07:00:00 via INTRAVENOUS

## 2017-11-22 MED ORDER — PROMETHAZINE HCL 25 MG/ML IJ SOLN: 6.2500 mg | INTRAMUSCULAR | Status: DC | PRN

## 2017-11-22 MED ORDER — MORPHINE SULFATE (PF) 2 MG/ML IV SOLN
1.0000 mg | INTRAVENOUS | Status: DC | PRN
  Administered 2017-11-22: 1 mg via INTRAVENOUS
  Filled 2017-11-22: qty 1

## 2017-11-22 MED ORDER — MIDAZOLAM HCL 2 MG/2ML IJ SOLN
INTRAMUSCULAR | Status: AC
  Filled 2017-11-22: qty 2

## 2017-11-22 MED ORDER — MAGNESIUM CITRATE PO SOLN
1.0000 | Freq: Once | ORAL | Status: DC | PRN
  Filled 2017-11-22: qty 296

## 2017-11-22 MED ORDER — ONDANSETRON HCL 4 MG PO TABS: 4.0000 mg | ORAL_TABLET | Freq: Four times a day (QID) | ORAL | Status: DC | PRN

## 2017-11-22 MED ORDER — ONDANSETRON HCL 4 MG/2ML IJ SOLN
INTRAMUSCULAR | Status: AC
  Filled 2017-11-22: qty 2

## 2017-11-22 MED ORDER — PERPHENAZINE 4 MG PO TABS: 2.0000 mg | ORAL_TABLET | Freq: Two times a day (BID) | ORAL | Status: DC

## 2017-11-22 SURGICAL SUPPLY — 66 items
BANDAGE ACE 4X5 VEL STRL LF (GAUZE/BANDAGES/DRESSINGS) ×2 IMPLANT
BANDAGE ELASTIC 3 LF NS (GAUZE/BANDAGES/DRESSINGS) ×2 IMPLANT
BANDAGE ELASTIC 4 LF NS (GAUZE/BANDAGES/DRESSINGS) ×1 IMPLANT
BIT DRILL 2 FAST STEP (BIT) ×2 IMPLANT
BIT DRILL 2.5X4 QC (BIT) ×2 IMPLANT
BNDG CMPR MED 5X3 ELC HKLP NS (GAUZE/BANDAGES/DRESSINGS)
BNDG CMPR MED 5X4 ELC HKLP NS (GAUZE/BANDAGES/DRESSINGS)
BNDG COHESIVE 4X5 TAN STRL (GAUZE/BANDAGES/DRESSINGS) ×1 IMPLANT
BNDG ESMARK 4X12 TAN STRL LF (GAUZE/BANDAGES/DRESSINGS) ×3 IMPLANT
BNDG PLASTER FAST 3X3 WHT LF (CAST SUPPLIES) ×2 IMPLANT
BNDG PLSTR 3X3 XFST ST WHT LF (CAST SUPPLIES)
CANISTER SUCT 1200ML W/VALVE (MISCELLANEOUS) ×3 IMPLANT
CLOSURE WOUND 1/2 X4 (GAUZE/BANDAGES/DRESSINGS) ×1
CORD BIP STRL DISP 12FT (MISCELLANEOUS) ×3 IMPLANT
CUFF TOURN 18 STER (MISCELLANEOUS) ×2 IMPLANT
DRAPE FLUOR MINI C-ARM 54X84 (DRAPES) ×3 IMPLANT
DRAPE SURG 17X11 SM STRL (DRAPES) ×3 IMPLANT
DRSG GAUZE FLUFF 36X18 (GAUZE/BANDAGES/DRESSINGS) ×3 IMPLANT
DURAPREP 26ML APPLICATOR (WOUND CARE) ×5 IMPLANT
ELECT REM PT RETURN 9FT ADLT (ELECTROSURGICAL) ×3
ELECTRODE REM PT RTRN 9FT ADLT (ELECTROSURGICAL) ×1 IMPLANT
FORCEPS JEWEL BIP 4-3/4 STR (INSTRUMENTS) ×3 IMPLANT
GAUZE 4X4 16PLY RFD (DISPOSABLE) ×5 IMPLANT
GAUZE PETRO XEROFOAM 1X8 (MISCELLANEOUS) ×3 IMPLANT
GAUZE XEROFORM 4X4 STRL (GAUZE/BANDAGES/DRESSINGS) ×2 IMPLANT
GLOVE BIO SURGEON STRL SZ7.5 (GLOVE) ×3 IMPLANT
GLOVE BIOGEL PI IND STRL 9 (GLOVE) ×1 IMPLANT
GLOVE BIOGEL PI INDICATOR 9 (GLOVE) ×2
GLOVE INDICATOR 7.5 STRL GRN (GLOVE) ×3 IMPLANT
GLOVE SURG 9.0 ORTHO LTXF (GLOVE) ×5 IMPLANT
GOWN STRL REUS TWL 2XL XL LVL4 (GOWN DISPOSABLE) ×3 IMPLANT
GOWN STRL REUS W/ TWL LRG LVL3 (GOWN DISPOSABLE) ×1 IMPLANT
GOWN STRL REUS W/TWL LRG LVL3 (GOWN DISPOSABLE) ×3
K-WIRE 1.6 (WIRE) ×9
K-WIRE FX5X1.6XNS BN SS (WIRE) ×3
KIT TURNOVER KIT A (KITS) ×3 IMPLANT
KWIRE FX5X1.6XNS BN SS (WIRE) IMPLANT
NDL FILTER BLUNT 18X1 1/2 (NEEDLE) ×1 IMPLANT
NEEDLE FILTER BLUNT 18X 1/2SAF (NEEDLE) ×2
NEEDLE FILTER BLUNT 18X1 1/2 (NEEDLE) ×1 IMPLANT
NS IRRIG 500ML POUR BTL (IV SOLUTION) ×3 IMPLANT
PACK EXTREMITY ARMC (MISCELLANEOUS) ×3 IMPLANT
PAD ABD DERMACEA PRESS 5X9 (GAUZE/BANDAGES/DRESSINGS) ×8 IMPLANT
PAD CAST CTTN 4X4 STRL (SOFTGOODS) ×2 IMPLANT
PAD PREP 24X41 OB/GYN DISP (PERSONAL CARE ITEMS) ×3 IMPLANT
PADDING CAST COTTON 4X4 STRL (SOFTGOODS) ×3
PEG SUBCHONDRAL SMOOTH 2.0X18 (Peg) ×2 IMPLANT
PEG SUBCHONDRAL SMOOTH 2.0X20 (Peg) ×8 IMPLANT
PEG SUBCHONDRAL SMOOTH 2.0X22 (Peg) ×2 IMPLANT
PEG THREADED 2.5MMX18MM LONG (Peg) ×2 IMPLANT
PEG THREADED 2.5MMX20MM LONG (Peg) ×2 IMPLANT
PLATE SHORT 24.4X51.3 LT (Plate) ×2 IMPLANT
SCREW BN 12X3.5XNS CORT TI (Screw) IMPLANT
SCREW CORT 3.5X12 (Screw) ×9 IMPLANT
SLING ARM M TX990204 (SOFTGOODS) ×2 IMPLANT
SPLINT CAST 1 STEP 3X12 (MISCELLANEOUS) ×4 IMPLANT
SPLINT CAST 1 STEP 4X30 (MISCELLANEOUS) ×1 IMPLANT
STOCKINETTE 48X4 2 PLY STRL (GAUZE/BANDAGES/DRESSINGS) ×1 IMPLANT
STOCKINETTE STRL 4IN 9604848 (GAUZE/BANDAGES/DRESSINGS) ×3 IMPLANT
STRIP CLOSURE SKIN 1/2X4 (GAUZE/BANDAGES/DRESSINGS) ×2 IMPLANT
SUT MNCRL AB 4-0 PS2 18 (SUTURE) ×3 IMPLANT
SUT VIC AB 0 CT2 27 (SUTURE) ×3 IMPLANT
SUT VIC AB 3-0 SH 27 (SUTURE) ×3
SUT VIC AB 3-0 SH 27X BRD (SUTURE) ×1 IMPLANT
SYR 10ML LL (SYRINGE) ×3 IMPLANT
TAPE TRANSPORE STRL 2 31045 (GAUZE/BANDAGES/DRESSINGS) ×1 IMPLANT

## 2017-11-22 NOTE — Progress Notes (Signed)
Fingers remaining warm to the touch encourage pt to move fingers frequently. Educated pt that arm needs to be elevated on 3 pillows.

## 2017-11-22 NOTE — Anesthesia Procedure Notes (Signed)
Procedure Name: Intubation Performed by: Demetrius Charity, CRNA Pre-anesthesia Checklist: Patient identified, Patient being monitored, Timeout performed, Emergency Drugs available and Suction available Patient Re-evaluated:Patient Re-evaluated prior to induction Oxygen Delivery Method: Circle system utilized Preoxygenation: Pre-oxygenation with 100% oxygen Induction Type: IV induction Ventilation: Mask ventilation without difficulty Laryngoscope Size: Mac and 3 Grade View: Grade I Tube type: Oral Tube size: 7.0 mm Number of attempts: 1 Airway Equipment and Method: Stylet Placement Confirmation: ETT inserted through vocal cords under direct vision,  positive ETCO2 and breath sounds checked- equal and bilateral Secured at: 22 cm Tube secured with: Tape Dental Injury: Teeth and Oropharynx as per pre-operative assessment

## 2017-11-22 NOTE — ED Provider Notes (Signed)
Patient requesting her Keppra as she has not taken the last 2 days.  Reviewed her dose as turned 50 mg at bedtime.  She is n.p.o. for surgery.  I will place the 200 mg Keppra ordered by IV.   Governor Rooks, MD 11/22/17 [phone]

## 2017-11-22 NOTE — ED Notes (Signed)
ED Provider at bedside. 

## 2017-11-22 NOTE — Progress Notes (Signed)
Patient was seen in the PACU after surgery.   She was having mild to moderate pain in the left wrist.   She can flex and extend all 5 fingers but pain limited her motion.   She had paresthesias in all 5 fingers.  Her fingers were well perfused.  Xrays showed the fracture was well reduced and hardware well positioned.  Patient elevating her left forearm on pillows with an ice pack on dorsal surface of her wrist.

## 2017-11-22 NOTE — ED Notes (Signed)
Matt RN signed for procedural consent for patient with consent of patient, witnessed by this RN.

## 2017-11-22 NOTE — Progress Notes (Signed)
Oral airway in place on pacu arrival.

## 2017-11-22 NOTE — ED Provider Notes (Signed)
[facility] Emergency Department Provider Note  Time seen: 4:57 AM  I have reviewed the triage vital signs and the nursing notes.   HISTORY  Chief Complaint Fall and Arm Injury    HPI Sylvia [name] is a 32 y.o. female with a past medical history of alcoholism, cerebral palsy with limited use of her right upper extremity, schizophrenia, seizure disorder, presents to the emergency department after a fall with left arm injury.  According to the patient she was drinking alcohol, was jumping on a bed and fell off landing on an outstretched left arm.  States immediate pain to her mid forearm, deformity per EMS placed in splint brought to the emergency department.  Patient was given fentanyl prior to arrival.  Here the patient denies any other injuries besides her forearm.  Denies hitting her head.  Denies LOC.  States moderate to severe pain much worse with any attempted movement of the left arm.   Past Medical History:  Diagnosis Date  . Alcoholism (HCC)   . CP (cerebral palsy) (HCC)   . Hepatitis C   . Herpes   . Schizoaffective schizophrenia (HCC) 09/17/2013   Per notes from [facility] as on 2014   . Seizures (HCC)   . Stroke [facility])     Patient Active Problem List   Diagnosis Date Noted  . Substance induced mood disorder (HCC) 07/24/2016  . 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 Cocaine use disorder, moderate, dependence (HCC) 03/17/2015  . Cannabis use disorder, mild, abuse 03/17/2015  . Alcohol use disorder, severe, dependence (HCC) 03/17/2015  . Schizoaffective disorder, bipolar type (HCC) 03/16/2015  . Seizure disorder (HCC) 09/17/2013  . Cellulitis and abscess of toe of right foot 09/16/2013  . Hepatitis C   . CP (cerebral palsy) (HCC)     Past Surgical History:  Procedure Laterality Date  . AMPUTATION Right 09/17/2013   Procedure: PARTIAL AMPUTATION 2ND TOE RIGHT FOOT;  Surgeon: Dallas Schimke, DPM;  Location: [facility];  Service:  Podiatry;  Laterality: Right;  . FOOT SURGERY     rt and left  . LEG SURGERY Right    x7    Prior to Admission medications   Medication Sig Start Date End Date Taking? Authorizing Provider  benztropine (COGENTIN) 0.5 MG tablet Take 0.5 mg by mouth at bedtime.    [provider]  citalopram (CELEXA) 20 MG tablet Take 20 mg by mouth daily.    [provider]  GABAPENTIN PO Take 300-400 mg by mouth 3 (three) times daily. Take 300mg  by mouth twice daily and 400mg  by mouth at bedtime    [provider]  hydrOXYzine (ATARAX/VISTARIL) 50 MG tablet Take 50 mg by mouth 3 (three) times daily.    [provider]  iloperidone (FANAPT) 4 MG TABS tablet Take 4 mg by mouth 2 (two) times daily.    [provider]  levETIRAcetam (KEPPRA) 250 MG tablet Take 250 mg by mouth at bedtime.    [provider]  methocarbamol (ROBAXIN) 500 MG tablet Take 1 tablet (500 mg total) by mouth 2 (two) times daily as needed for muscle spasms. Patient not taking: Reported on 09/09/2015 07/06/15   Danelle Berry, PA-C  nicotine polacrilex (NICORETTE) 2 MG gum Take 1 each (2 mg total) by mouth as needed for smoking cessation. Patient not taking: Reported on 07/06/2015 03/24/15   [name], NP  ondansetron (ZOFRAN ODT) 4 MG disintegrating tablet Take 1 tablet (4 mg total) by mouth every  8 (eight) hours as needed for nausea or vomiting. Patient taking differently: Take 4 mg 2 (two) times daily by mouth.  07/07/15   Earley Favor, NP  perphenazine (TRILAFON) 2 MG tablet Take 2-6 mg 2 (two) times daily by mouth. Take 1 tablet by mouth every morning and 3 tablets by mouth at bedtime    [provider]  risperiDONE (RISPERDAL) 3 MG tablet Take 2 tablets (6 mg total) by mouth at bedtime. Patient not taking: Reported on 12/26/2016 03/24/15   [name], NP  valACYclovir (VALTREX) 1000 MG tablet Take 1 tablet (1,000 mg total) by mouth daily. Patient not taking: Reported on  12/26/2016 06/26/16   [name], MD  VITAMIN D, ERGOCALCIFEROL, PO Take 1,000 Units daily by mouth.     [provider]    Allergies  Allergen Reactions  . Diphenhydramine Hcl Rash    Reaction:  Lowers Dilantin level   . Fluoxetine Other (See Comments)    Reaction:  Altered mental status   . Baclofen Other (See Comments)    Drops blood pressure, per pt  . Depakote [Divalproex Sodium] Other (See Comments)    Reaction:  Seizures and drops pts BP  . Quetiapine Other (See Comments)    Reaction:  Drops pts BP    Family History  Problem Relation Age of Onset  . Bipolar disorder Mother   . Cancer Maternal Grandmother   . Diabetes Maternal Grandfather   . Hypertension Maternal Grandfather     Social History Social History   Tobacco Use  . Smoking status: Current Every Day Smoker    Packs/day: 0.50    Years: 20.00    Pack years: 10.00    Types: Cigarettes  . Smokeless tobacco: Former Engineer, water Use Topics  . Alcohol use: Yes    Alcohol/week: 3.0 standard drinks    Types: 3 Cans of beer per week    Comment: Per patient 0.5 gallon to gallon when she can but since being in group home she can't do it as often   . Drug use: Yes    Types: "Crack" cocaine, Marijuana, Cocaine    Comment: Per patient uses herion and crack and cocaine "once in a blue moon"    Review of Systems Constitutional: Negative for fever. Cardiovascular: Negative for chest pain. Respiratory: Negative for shortness of breath. Gastrointestinal: Negative for abdominal pain Genitourinary: Negative for urinary compaints Musculoskeletal: Left arm pain/deformity Skin: Negative for skin complaints  Neurological: Negative for headache All other ROS negative  ____________________________________________   PHYSICAL EXAM:  VITAL SIGNS: ED Triage Vitals  Enc Vitals Group     BP 11/22/17 0451 121/68     Pulse Rate 11/22/17 0451 92     Resp 11/22/17 0451 17     Temp 11/22/17 0451 98.1 F (36.7  C)     Temp Source 11/22/17 0451 Oral     SpO2 11/22/17 0451 100 %     Weight 11/22/17 0455 125 lb (56.7 kg)     Height 11/22/17 0455 5\' 4"  (1.626 m)     Head Circumference --      Peak Flow --      Pain Score 11/22/17 0455 9     Pain Loc --      Pain Edu? --      Excl. in GC? --     Constitutional: Alert and oriented. Well appearing and in no distress. Eyes: Normal exam ENT   Head: Normocephalic and atraumatic.  Mouth/Throat: Mucous membranes are moist. Cardiovascular: Normal rate, regular rhythm. No murmur Respiratory: Normal respiratory effort without tachypnea nor retractions. Breath sounds are clear  Gastrointestinal: Soft and nontender. No distention.   Musculoskeletal: Patient has mild tenderness along the left mid humerus, moderate to severe tenderness across the mid to distal forearm on the left side.  Neurovascularly intact distally. Neurologic:  Normal speech and language. No gross focal neurologic deficits Skin:  Skin is warm, dry and intact.  Psychiatric: Mood and affect are normal.   ____________________________________________   RADIOLOGY  X-ray shows distal radial fracture with significant displacement.  Nondisplaced ulna fracture.  ____________________________________________   INITIAL IMPRESSION / ASSESSMENT AND PLAN / ED COURSE  Pertinent labs & imaging results that were available during my care of the patient were reviewed by me and considered in my medical decision making (see chart for details).  Patient presents to the emergency department after a fall landing on her left arm, has a forearm deformity most consistent with fracture.  We will check labs to evaluate, x-ray to help rule out fracture/deformity.  Patient does have cerebral palsy with limited right upper extremity usage.  Her left upper extremity will likely require a splint/cast which could cause an issue with her home self-care.  I discussed the patient with orthopedics.  They will  be admitting to their service for likely surgery.  We will splint in place as the patient is neurovascularly intact currently.  Patient agreeable to plan of care.  Blood work is largely nonrevealing.  Urine drug screen and pregnancy test pending.  ____________________________________________   FINAL CLINICAL IMPRESSION(S) / ED DIAGNOSES  Radius and ulna fracture    Minna Antis, MD 11/22/17 [phone]

## 2017-11-22 NOTE — Progress Notes (Signed)
Left hand and fingers pink in color, warm to touch. 

## 2017-11-22 NOTE — H&P (Addendum)
PREOPERATIVE H&P  Chief Complaint: left distal radius and ulnar fractures s/p fall  HPI: Sylvia [name] is a 32 y.o. female who scented to the emergency room after a fall overnight.  She has a history of alcoholism, cerebral palsy with limited use of her right upper extremity, schizophrenia, seizure disorder and polysubstance abuse.  Patient complains of pain in the left wrist but denies other injuries.  Past Medical History:  Diagnosis Date  . Alcoholism (HCC)   . CP (cerebral palsy) (HCC)   . Hepatitis C   . Herpes   . Polysubstance abuse (HCC)   . Schizoaffective schizophrenia (HCC) 09/17/2013   Per notes from [facility] as on 2014   . Seizures (HCC)   . Stroke [facility])    Past Surgical History:  Procedure Laterality Date  . AMPUTATION Right 09/17/2013   Procedure: PARTIAL AMPUTATION 2ND TOE RIGHT FOOT;  Surgeon: Dallas Schimke, DPM;  Location: [facility];  Service: Podiatry;  Laterality: Right;  . FOOT SURGERY     rt and left  . LEG SURGERY Right    x7   Social History   Socioeconomic History  . Marital status: Single    Spouse name: Not on file  . Number of children: Not on file  . Years of education: Not on file  . Highest education level: Not on file  Occupational History  . Not on file  Social Needs  . Financial resource strain: Not on file  . Food insecurity:    Worry: Not on file    Inability: Not on file  . Transportation needs:    Medical: Not on file    Non-medical: Not on file  Tobacco Use  . Smoking status: Current Every Day Smoker    Packs/day: 1.00    Years: 20.00    Pack years: 20.00    Types: Cigarettes  . Smokeless tobacco: Former Engineer, water and Sexual Activity  . Alcohol use: Yes    Alcohol/week: 3.0 standard drinks    Types: 3 Cans of beer per week    Comment: Per patient 0.5 gallon to gallon when she can but since being in group home she can't do it as often   . Drug use: Yes    Types: "Crack" cocaine, Marijuana, Cocaine    Comment: Per  patient uses herion and crack and cocaine "once in a blue moon"  . Sexual activity: Not Currently    Birth control/protection: Implant  Lifestyle  . Physical activity:    Days per week: Not on file    Minutes per session: Not on file  . Stress: Not on file  Relationships  . Social connections:    Talks on phone: Not on file    Gets together: Not on file    Attends religious service: Not on file    Active member of club or organization: Not on file    Attends meetings of clubs or organizations: Not on file    Relationship status: Not on file  Other Topics Concern  . Not on file  Social History Narrative   Lives at home by herself, only has acquaintances here   Does not work    Family History  Problem Relation Age of Onset  . Bipolar disorder Mother   . Cancer Maternal Grandmother   . Diabetes Maternal Grandfather   . Hypertension Maternal Grandfather    Allergies  Allergen Reactions  . Diphenhydramine Hcl Rash    Reaction:  Lowers Dilantin level   .  Fluoxetine Other (See Comments)    Reaction:  Altered mental status   . Baclofen Other (See Comments)    Drops blood pressure, per pt  . Depakote [Divalproex Sodium] Other (See Comments)    Reaction:  Seizures and drops pts BP  . Quetiapine Other (See Comments)    Reaction:  Drops pts BP   Prior to Admission medications   Medication Sig Start Date End Date Taking? Authorizing Provider  benztropine (COGENTIN) 0.5 MG tablet Take 0.5 mg by mouth at bedtime.    [provider]  citalopram (CELEXA) 20 MG tablet Take 20 mg by mouth daily.    [provider]  GABAPENTIN PO Take 300-400 mg by mouth 3 (three) times daily. Take 300mg  by mouth twice daily and 400mg  by mouth at bedtime    [provider]  hydrOXYzine (ATARAX/VISTARIL) 50 MG tablet Take 50 mg by mouth 3 (three) times daily.    [provider]  iloperidone (FANAPT) 4 MG TABS tablet Take 4 mg by mouth 2 (two) times daily.    [provider]  levETIRAcetam (KEPPRA) 250 MG tablet Take 250 mg by mouth at bedtime.    [provider]  methocarbamol (ROBAXIN) 500 MG tablet Take 1 tablet (500 mg total) by mouth 2 (two) times daily as needed for muscle spasms. Patient not taking: Reported on 09/09/2015 07/06/15   Danelle Berry, PA-C  nicotine polacrilex (NICORETTE) 2 MG gum Take 1 each (2 mg total) by mouth as needed for smoking cessation. Patient not taking: Reported on 07/06/2015 03/24/15   [name], NP  ondansetron (ZOFRAN ODT) 4 MG disintegrating tablet Take 1 tablet (4 mg total) by mouth every 8 (eight) hours as needed for nausea or vomiting. Patient taking differently: Take 4 mg 2 (two) times daily by mouth.  07/07/15   Earley Favor, NP  perphenazine (TRILAFON) 2 MG tablet Take 2-6 mg 2 (two) times daily by mouth. Take 1 tablet by mouth every morning and 3 tablets by mouth at bedtime    [provider]  risperiDONE (RISPERDAL) 3 MG tablet Take 2 tablets (6 mg total) by mouth at bedtime. Patient not taking: Reported on 12/26/2016 03/24/15   [name], NP  valACYclovir (VALTREX) 1000 MG tablet Take 1 tablet (1,000 mg total) by mouth daily. Patient not taking: Reported on 12/26/2016 06/26/16   [name], MD  VITAMIN D, ERGOCALCIFEROL, PO Take 1,000 Units daily by mouth.     [provider]     Positive ROS: All other systems have been reviewed and were otherwise negative with the exception of those mentioned in the HPI and as above.  Physical Exam: General: Patient lethargic but arousable, no acute distress Cardiovascular: Regular rate and rhythm, no murmurs rubs or gallops.  No pedal edema Respiratory: Clear to auscultation bilaterally, no wheezes rales or rhonchi. No cyanosis, no use of accessory musculature GI: No organomegaly, abdomen is soft and non-tender nondistended with positive bowel sounds. Skin: Skin intact, no lesions within the operative field. Neurologic: Sensation intact  distally Lymphatic: No cervical lymphadenopathy  MUSCULOSKELETAL: Left extremity: Patient is lying in her hospital bed.  She is a sugar tong splint over the left forearm and wrist.  Her fingers well-perfused.  She is intact sensation to light touch but has difficulty with flexion extension due to pain.  Assessment: Displaced left distal radius fracture with nondisplaced ulnar fracture  Plan: Plan for Procedure(s): OPEN REDUCTION INTERNAL FIXATION (ORIF) LEFT DISTAL RADIAL FRACTURE  I  explained to the patient the nature of her fracture.  She is a displaced left distal radius fracture with a nondisplaced ulnar fracture.  I am recommending open reduction internal fixation for this injury.  Patient has limited use of her right arm and she will be able to move the left arm more quickly if ORIF is used to fix the fracture.  Cast treatment is an option but will immobilize her wrist for a longer period of time.  I discussed the risks and benefits of surgery. The risks include but are not limited to infection, bleeding, nerve or blood vessel injury, joint stiffness or loss of motion, persistent pain, weakness or instability, malunion, nonunion and hardware failure and the need for further surgery. Medical risks include but are not limited to DVT and pulmonary embolism, myocardial infarction, stroke, pneumonia, respiratory failure and death. Patient understood these risks and wished to proceed.   A urine toxicology screen is pending at the time of dictation.  A hospitalist consult has been ordered for preop clearance given the patient's history of seizure disorder, alcoholism and polysubstance abuse.  Patient will be brought to surgery when she is cleared.    Juanell Fairly, MD   11/22/2017 11:05 AM

## 2017-11-22 NOTE — Care Management (Signed)
Left hand dominant and now with injury pending surgery. Lives at home alone and followed by [facility] nurse for medications. She will need help with with transportation to appointments and  [facility] may be able to help with that. MOON explained to patient. Unable to sign MOON due to dominant hand injury. No family support per patient. She also has CP but denies deficients at this time.

## 2017-11-22 NOTE — Plan of Care (Signed)
°  Problem: Education: °Goal: Knowledge of General Education information will improve °Description: Including pain rating scale, medication(s)/side effects and non-pharmacologic comfort measures °Outcome: Progressing °  °Problem: Health Behavior/Discharge Planning: °Goal: Ability to manage health-related needs will improve °Outcome: Progressing °  °Problem: Clinical Measurements: °Goal: Will remain free from infection °Outcome: Progressing °  °Problem: Activity: °Goal: Risk for activity intolerance will decrease °Outcome: Progressing °  °Problem: Nutrition: °Goal: Adequate nutrition will be maintained °Outcome: Progressing °  °Problem: Coping: °Goal: Level of anxiety will decrease °Outcome: Progressing °  °Problem: Elimination: °Goal: Will not experience complications related to bowel motility °Outcome: Progressing °  °Problem: Pain Managment: °Goal: General experience of comfort will improve °Outcome: Progressing °  °Problem: Safety: °Goal: Ability to remain free from injury will improve °Outcome: Progressing °  °Problem: Skin Integrity: °Goal: Risk for impaired skin integrity will decrease °Outcome: Progressing °  °

## 2017-11-22 NOTE — Transfer of Care (Signed)
Immediate Anesthesia Transfer of Care Note  Patient: Sylvia [name]  Procedure(s) Performed: OPEN REDUCTION INTERNAL FIXATION (ORIF) DISTAL RADIAL FRACTURE and carpal tunnel release (Left )  Patient Location: PACU  Anesthesia Type:General  Level of Consciousness: drowsy  Airway & Oxygen Therapy: Patient Spontanous Breathing and Patient connected to face mask oxygen  Post-op Assessment: Report given to RN and Post -op Vital signs reviewed and stable  Post vital signs: Reviewed and stable  Last Vitals:  Vitals Value Taken Time  BP 131/93 11/22/2017  6:24 PM  Temp    Pulse 73 11/22/2017  6:25 PM  Resp 14 11/22/2017  6:25 PM  SpO2 100 % 11/22/2017  6:25 PM  Vitals shown include unvalidated device data.  Last Pain:  Vitals:   11/22/17 1037  TempSrc:   PainSc: 8          Complications: No apparent anesthesia complications

## 2017-11-22 NOTE — Anesthesia Preprocedure Evaluation (Addendum)
Anesthesia Evaluation  Patient identified by MRN, date of birth, ID band Patient awake    Reviewed: Allergy & Precautions, H&P , NPO status , Patient's Chart, lab work & pertinent test results  Airway Mallampati: III  TM Distance: >3 FB Neck ROM: full    Dental  (+) Chipped, Poor Dentition   Pulmonary neg pulmonary ROS, Current Smoker,    breath sounds clear to auscultation       Cardiovascular negative cardio ROS   Rhythm:regular Rate:Normal     Neuro/Psych Seizures -,  PSYCHIATRIC DISORDERS Bipolar Disorder Schizophrenia  Neuromuscular disease (CP) CVA negative neurological ROS  negative psych ROS   GI/Hepatic negative GI ROS, Neg liver ROS, (+)     substance abuse  alcohol use, Hepatitis -, CShe drinks 12-24 beers per day   Endo/Other  negative endocrine ROS  Renal/GU      Musculoskeletal   Abdominal   Peds  Hematology negative hematology ROS (+)   Anesthesia Other Findings Pink macular rash on right chest and RUE  Past Medical History: No date: Alcoholism (HCC) No date: CP (cerebral palsy) (HCC) No date: Hepatitis C No date: Herpes No date: Polysubstance abuse (HCC) 09/17/2013: Schizoaffective schizophrenia (HCC)     Comment:  Per notes from [facility] as on 2014  No date: Seizures (HCC) No date: Stroke [facility])  Past Surgical History: 09/17/2013: AMPUTATION; Right     Comment:  Procedure: PARTIAL AMPUTATION 2ND TOE RIGHT FOOT;                Surgeon: Dallas Schimke, DPM;  Location: [facility];               Service: Podiatry;  Laterality: Right; No date: FOOT SURGERY     Comment:  rt and left No date: LEG SURGERY; Right     Comment:  x7  BMI    Body Mass Index:  21.46 kg/m      Reproductive/Obstetrics negative OB ROS                            Anesthesia Physical Anesthesia Plan  ASA: III  Anesthesia Plan: General ETT   Post-op Pain Management:    Induction:    PONV Risk Score and Plan: Ondansetron, Dexamethasone and Midazolam  Airway Management Planned:   Additional Equipment:   Intra-op Plan:   Post-operative Plan:   Informed Consent: I have reviewed the patients History and Physical, chart, labs and discussed the procedure including the risks, benefits and alternatives for the proposed anesthesia with the patient or authorized representative who has indicated his/her understanding and acceptance.   Dental Advisory Given  Plan Discussed with: Anesthesiologist, CRNA and Surgeon  Anesthesia Plan Comments:         Anesthesia Quick Evaluation

## 2017-11-22 NOTE — Op Note (Addendum)
11/22/2017  6:51 PM  PATIENT:  Sylvia [name]    PRE-OPERATIVE DIAGNOSIS:  left distal radius and ulna fractures with significant swelling  POST-OPERATIVE DIAGNOSIS:  Same  PROCEDURE:  OPEN REDUCTION INTERNAL FIXATION (ORIF) OF LEFT DISTAL RADIUS FRACTURE and opencarpal tunnel release  SURGEON:  Thornton Park, MD  ANESTHESIA:   General  OPERATIVE IMPLANTS: [employer] hand innovations plate  Tourniquet: 902 minutes  PREOPERATIVE INDICATIONS:  Sylvia [name] is a  32 y.o. female with a diagnosis of displaced left distal radius fracture with a non-displaced ulna fracture.   I recommended ORIF of the left distal radius given the displacement.    I discussed the risks and benefits of surgery. The risks include but are not limited to infection, bleeding, nerve or blood vessel injury, joint stiffness or loss of motion, persistent pain, weakness, recurrence of symptoms and the need for further surgery. Medical risks include but are not limited to DVT and pulmonary embolism, myocardial infarction, stroke, pneumonia, respiratory failure and death. Patient understood these risks and wished to proceed.   OPERATIVE FINDINGS:  1.  Significant wrist swelling, ecchymosis and median nerve compression at the carpal tunnel, right upper extremity 2.  Displaced and comminuted fracture of the left distal radius  OPERATIVE PROCEDURE: Patient was met in the preoperative area. I signed the left wrist with my initials and the word yes according the hospital's correct site of surgery protocol. I answered all the patient's questions. The patient was then brought to the operating room where she underwent general anesthesia.  She was positioned supine on the operative table. The left arm was placed on a hand table. A tourniquet was applied to the left upper extremity. She was prepped and draped in a sterile fashion. A timeout was performed to verify the patient's name, date of birth, medical record number, correct site  of surgery correct procedure to be performed. The time out was also used to confirm the patient received antibiotics that all necessary instruments were available in the room. The left upper extremity was then exsanguinated with an Esmarch and the tourniquet inflated to 250 mmHg.   The patient had significant swelling, ecchymosis and dorsal angular deformity of the left wrist.  Forearm compartments were soft and compressible.  I had concern that the patient might develop acute carpal tunnel due to her injury.  The decision was made to release her carpal tunnel in addition to fixation of her left distal radius fracture.    A linear incision was then made over the FCR tendon and extended with a zig-zag pattern over the volar wrist crease and continued to the palmar crease. This was made in line with the web space between the middle and ring fingers and the distal extent of the incision was where it intersected Kaplan's cardinal line. Bleeding vessels were cauterized with a bipolar.  The subcutaneous tissue was carefully dissected out with a Metzenbaum scissor and pickup until the palmar fascia was encountered. The distal extent of the transverse carpal ligament was then identified. A Freer elevator was placed under the transverse carpal ligament running distally to proximally. A micro-Beaver blade was then used to incise the transverse carpal ligament taking care to avoid injury to any neurovascular structures. The carpal tunnel was found to be extremely constricted. There was significant compression on the median nerve. The transverse carpal ligament was completely released. The nerve was visualized in its entirety and the carpal tunnel.  .  The attention was then turned back to the  distal radius fracture.  The subcutaneous tissue over the distal radius was carefully dissected using Metzenbaum scissor and Adson pickup. Retractors were used to protect the radial artery and median nerve. The pronator quadratus  was identified and incised and elevated off the volar surface of the distal radius.  A manual reduction of the fracture was then performed.  A 3-hole [employer] volar plate was then positioned on the under surface of the distal radius. It was held into position with 3 K wires. The fracture reduction and position of the plate were confirmed on AP and lateral fluoroscan images. Once the plate was well positioned the distal row of pegs was placed first with smooth pegs only. Each individual peg hole was drilled and then measured with a depth gauge. The position of the pegs was confirmed on fluoroscan to ensure no pegs penetrated into the radiocarpal joint.  Then the proximal row pegs were then placed using a combination of smooth and threaded pegs. The position and length of all screws were confirmed on AP and lateral FluoroScan imaging.   Once all distal pegs were placed, the attention was turned back to placement of bicortical shaft screws. Three screws were placed in the plate, for a total of 3 bicortical shaft screws. Final images of the fracture fixation were taken by fluoroscan.   The fracture was well reducted and the hardware was well-positioned. The wound was copiously irrigated. The subcutaneous tissue was closed with 3-0 vicryl and the skin was closed with 4-0 nylon. Steristrips, Xeroform and a dry sterile dressing were applied along with an AP splint. I was scrubbed and present for the entire case and all sharp and instrument counts were correct at the conclusion the case. The patient tolerated this procedure well. I called and left a message for Joy Olen-Chalk at the patient's request at the conclusion of surgery.      Timoteo Gaul, MD    Timoteo Gaul, MD

## 2017-11-22 NOTE — Care Management Obs Status (Signed)
MEDICARE OBSERVATION STATUS NOTIFICATION   Patient Details  Name: TANIS HENSARLING MRN: [id number] Date of Birth: 1985/02/20   Medicare Observation Status Notification Given:  Yes    Collie Siad, RN 11/22/2017, 12:35 PM

## 2017-11-22 NOTE — Anesthesia Postprocedure Evaluation (Signed)
Anesthesia Post Note  Patient: Sylvia [name]  Procedure(s) Performed: OPEN REDUCTION INTERNAL FIXATION (ORIF) DISTAL RADIAL FRACTURE and carpal tunnel release (Left )  Patient location during evaluation: PACU Anesthesia Type: General Level of consciousness: awake and alert Pain management: pain level controlled Vital Signs Assessment: post-procedure vital signs reviewed and stable Respiratory status: spontaneous breathing, nonlabored ventilation and respiratory function stable Cardiovascular status: blood pressure returned to baseline and stable Postop Assessment: no apparent nausea or vomiting Anesthetic complications: no     Last Vitals:  Vitals:   11/22/17 1845 11/22/17 1855  BP:  123/87  Pulse: 71 70  Resp: 17 12  Temp:    SpO2: 100% 100%    Last Pain:  Vitals:   11/22/17 1845  TempSrc:   PainSc: Asleep                 Jovita Gamma

## 2017-11-22 NOTE — Anesthesia Post-op Follow-up Note (Signed)
Anesthesia QCDR form completed.        

## 2017-11-22 NOTE — Consult Note (Signed)
[facility] - [place] at [facility]   PATIENT NAME: Sylvia [name]    MR#:  [id number]  DATE OF BIRTH:  09-07-1985  DATE OF ADMISSION:  11/22/2017  PRIMARY CARE PHYSICIAN: Jeanice Lim, PA-C   REQUESTING/REFERRING PHYSICIAN: Dr. Juanell Fairly  CHIEF COMPLAINT:   Chief Complaint  Patient presents with  . Fall  . Arm Injury    HISTORY OF PRESENT ILLNESS:  Sylvia [name]  is a 32 y.o. female with a known history of polysubstance abuse, alcohol abuse, cerebral palsy, hep C due to use of IV drugs in the past, schizophrenia, depression and anxiety presents to hospital after she had a fall and distal radial ulnar fracture. Admitted to Ortho service for operative reduction and internal fixation.  Medical consult requested for optimization her medical issues and preop clearance.  Denies any chest pain, no cardiac issues.  Active at baseline and has had surgeries on her ankle since her study on ambulation.  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  Claims to have fallen from the bed, denies any seizures in the last several months.  Was incarcerated for almost 6 months and just got released out of jail about a week ago.  Last use of smoking of cocaine was about 2 days ago.  And was also binge drinking alcohol.  Smokes up to a pack every day.  Has not taken any of her medications for more than 6 months since she has been in jail other than her Keppra.  PAST MEDICAL HISTORY:   Past Medical History:  Diagnosis Date  . Alcoholism (HCC)   . CP (cerebral palsy) (HCC)   . Hepatitis C   . Herpes   . Polysubstance abuse (HCC)   . Schizoaffective schizophrenia (HCC) 09/17/2013   Per notes from [facility] as on 2014   . Seizures (HCC)   . Stroke [facility])     PAST SURGICAL HISTOIRY:   Past Surgical History:  Procedure Laterality Date  . AMPUTATION Right 09/17/2013   Procedure: PARTIAL AMPUTATION 2ND TOE RIGHT FOOT;  Surgeon: Dallas Schimke, DPM;  Location: [facility];  Service: Podiatry;  Laterality: Right;  . FOOT SURGERY     rt and left  . LEG SURGERY Right    x7    SOCIAL HISTORY:   Social History   Tobacco Use  . Smoking status: Current Every Day Smoker    Packs/day: 1.00    Years: 20.00    Pack years: 20.00    Types: Cigarettes  . Smokeless tobacco: Former Engineer, water Use Topics  . Alcohol use: Yes    Alcohol/week: 3.0 standard drinks    Types: 3 Cans of beer per week    Comment: Per patient 0.5 gallon to gallon when she can but since being in group home she can't do it as often     FAMILY HISTORY:   Family History  Problem Relation Age of Onset  . Bipolar disorder Mother   . Cancer Maternal Grandmother   . Diabetes Maternal Grandfather   . Hypertension Maternal Grandfather     DRUG ALLERGIES:   Allergies  Allergen Reactions  . Diphenhydramine Hcl Rash    Reaction:  Lowers Dilantin level   . Fluoxetine Other (See Comments)    Reaction:  Altered mental status   . Baclofen Other (See Comments)    Drops blood pressure, per pt  . Depakote [Divalproex Sodium] Other (See Comments)  Reaction:  Seizures and drops pts BP  . Quetiapine Other (See Comments)    Reaction:  Drops pts BP    REVIEW OF SYSTEMS:   Review of Systems  Constitutional: Negative for chills, fever, malaise/fatigue and weight loss.  HENT: Negative for ear discharge, ear pain, hearing loss, nosebleeds and tinnitus.   Eyes: Negative for blurred vision, double vision and photophobia.  Respiratory: Negative for cough, hemoptysis, shortness of breath and wheezing.   Cardiovascular: Negative for chest pain, palpitations, orthopnea and leg swelling.  Gastrointestinal: Negative for abdominal pain, constipation, diarrhea, heartburn, melena, nausea and vomiting.  Genitourinary: Negative for dysuria, frequency, hematuria and urgency.  Musculoskeletal: Positive for joint pain and myalgias. Negative for back pain and neck  pain.  Skin: Negative for rash.  Neurological: Positive for focal weakness and seizures. Negative for dizziness, tingling, tremors, sensory change, speech change and headaches.  Endo/Heme/Allergies: Does not bruise/bleed easily.  Psychiatric/Behavioral: Negative for depression. The patient is nervous/anxious.      MEDICATIONS AT HOME:   Prior to Admission medications   Medication Sig Start Date End Date Taking? Authorizing Provider  benztropine (COGENTIN) 0.5 MG tablet Take 0.5 mg by mouth at bedtime.    [provider]  citalopram (CELEXA) 20 MG tablet Take 20 mg by mouth daily.    [provider]  GABAPENTIN PO Take 300-400 mg by mouth 3 (three) times daily. Take 300mg  by mouth twice daily and 400mg  by mouth at bedtime    [provider]  hydrOXYzine (ATARAX/VISTARIL) 50 MG tablet Take 50 mg by mouth 3 (three) times daily.    [provider]  iloperidone (FANAPT) 4 MG TABS tablet Take 4 mg by mouth 2 (two) times daily.    [provider]  levETIRAcetam (KEPPRA) 250 MG tablet Take 250 mg by mouth at bedtime.    [provider]  methocarbamol (ROBAXIN) 500 MG tablet Take 1 tablet (500 mg total) by mouth 2 (two) times daily as needed for muscle spasms. Patient not taking: Reported on 09/09/2015 07/06/15   Danelle Berry, PA-C  nicotine polacrilex (NICORETTE) 2 MG gum Take 1 each (2 mg total) by mouth as needed for smoking cessation. Patient not taking: Reported on 07/06/2015 03/24/15   [name], NP  ondansetron (ZOFRAN ODT) 4 MG disintegrating tablet Take 1 tablet (4 mg total) by mouth every 8 (eight) hours as needed for nausea or vomiting. Patient taking differently: Take 4 mg 2 (two) times daily by mouth.  07/07/15   Earley Favor, NP  perphenazine (TRILAFON) 2 MG tablet Take 2-6 mg 2 (two) times daily by mouth. Take 1 tablet by mouth every morning and 3 tablets by mouth at bedtime    [provider]  risperiDONE (RISPERDAL) 3 MG  tablet Take 2 tablets (6 mg total) by mouth at bedtime. Patient not taking: Reported on 12/26/2016 03/24/15   [name], NP  valACYclovir (VALTREX) 1000 MG tablet Take 1 tablet (1,000 mg total) by mouth daily. Patient not taking: Reported on 12/26/2016 06/26/16   [name], MD  VITAMIN D, ERGOCALCIFEROL, PO Take 1,000 Units daily by mouth.     [provider]      VITAL SIGNS:  Blood pressure 128/76, pulse 78, temperature 98.4 F (36.9 C), temperature source Oral, resp. rate 18, height 5\' 4"  (1.626 m), weight 56.7 kg, SpO2 100 %.  PHYSICAL EXAMINATION:   Physical Exam  GENERAL:  32 y.o.-year-old patient lying in the bed with no acute distress.  EYES:  Pupils equal, round, reactive to light and accommodation. No scleral icterus. Extraocular muscles intact.  HEENT: Head atraumatic, normocephalic. Oropharynx and nasopharynx clear.  NECK:  Supple, no jugular venous distention. No thyroid enlargement, no tenderness.  LUNGS: Normal breath sounds bilaterally, no wheezing, rales,rhonchi or crepitation. No use of accessory muscles of respiration.  CARDIOVASCULAR: S1, S2 normal. No murmurs, rubs, or gallops.  ABDOMEN: Soft, nontender, nondistended. Bowel sounds present. No organomegaly or mass.  EXTREMITIES: No pedal edema, cyanosis, or clubbing.  NEUROLOGIC: Cranial nerves II through XII are intact. Muscle strength 5/5 in all extremities. Sensation intact. Gait not checked.  PSYCHIATRIC: The patient is alert and oriented x 3.  SKIN: No obvious rash, lesion, or ulcer.   LABORATORY PANEL:   CBC Recent Labs  Lab 11/22/17 0453  WBC 9.0  HGB 11.8*  HCT 33.2*  PLT 172   ------------------------------------------------------------------------------------------------------------------  Chemistries  Recent Labs  Lab 11/22/17 0453  NA 134*  K 4.6  CL 98  CO2 24  GLUCOSE 90  BUN 7  CREATININE 0.69  CALCIUM 8.7*    ------------------------------------------------------------------------------------------------------------------  Cardiac Enzymes No results for input(s): TROPONINI in the last 168 hours. ------------------------------------------------------------------------------------------------------------------  RADIOLOGY:  Dg Forearm Left  Result Date: 11/22/2017 CLINICAL DATA:  Post fall with left arm pain and deformity. EXAM: LEFT FOREARM - 2 VIEW COMPARISON:  None. FINDINGS: Displaced distal radial metaphyseal fracture with 3/4 shaft width dorsal displacement and mild apex volar angulation. Fracture involves the distal radioulnar joint. Possible but not definitive radiocarpal involvement. There is a minimally displaced ulna styloid fracture. Additional nondisplaced incomplete fracture of the distal ulnar shaft. Proximal radius and ulna are intact. Diffuse soft tissue edema about the wrist. IMPRESSION: 1. Distal radial metaphyseal fracture with significant displacement, mild apex volar angulation and radiocarpal joint involvement. 2. Minimally displaced ulna styloid fracture. Additional nondisplaced distal ulnar shaft fracture. Electronically Signed   By: Narda Rutherford M.D.   On: 11/22/2017 05:18   Dg Humerus Left  Result Date: 11/22/2017 CLINICAL DATA:  Post fall with left arm pain and deformity. EXAM: LEFT HUMERUS - 2+ VIEW COMPARISON:  None. FINDINGS: Cortical margins of the humerus are intact. No acute fracture. Elbow and shoulder alignment are maintained. No focal soft tissue abnormality. Nexplanon visualized in the mid arm. IMPRESSION: Negative radiograph the left humerus. Electronically Signed   By: Narda Rutherford M.D.   On: 11/22/2017 05:19    EKG:   Orders placed or performed during the hospital encounter of 09/13/16  . EKG 12-Lead  . EKG 12-Lead  . ED EKG  . ED EKG    IMPRESSION AND PLAN:   Sylvia [name]  is a 32 y.o. female with a known history of polysubstance abuse, alcohol  abuse, cerebral palsy, hep C due to use of IV drugs in the past, schizophrenia, depression and anxiety presents to hospital after she had a fall and distal radial ulnar fracture.  1.  Left distal radial ulnar fracture-can proceed with surgery if urine tox screen negative for cocaine.  No active medical issues. -Continue her Keppra -Pain control and physical therapy after surgery -Management per orthopedics  2.  Seizure disorder-continue Keppra.  Dose needs to be verified.  3.  Depression and anxiety-again not taking any of her medicines for more than 7 months now we will just start Celexa as she complains of anxiety episodes.  4.  Polysubstance abuse-urine tox screen is pending -Counseled but not ready to quit.  5.  Alcohol abuse disorder-monitor for any withdrawals.  Use Ativan as needed  6.  Tobacco use disorder-on nicotine patch  7.  DVT prophylaxis-will be started after surgery     All the records are reviewed and case discussed with Consulting provider. Management plans discussed with the patient, family and they are in agreement.  CODE STATUS: Full Code  TOTAL TIME TAKING CARE OF THIS PATIENT: 50 minutes.    Taleeya Blondin M.D on 11/22/2017 at 11:03 AM  Between 7am to 6pm - Pager - [phone]  After 6pm go to [email or web address] - password [email or web address] [facility]  [facility] [place]  Office  [phone]  CC: Primary care Physician: Jeanice Lim, PA-C

## 2017-11-22 NOTE — ED Triage Notes (Signed)
Patient coming [facility] from home for fall resulting in obvious left forearm deformity. Patient has hx of cerebral palsy and seizures. Patient has not had Keppra or ativan in 2 days.   Patient has decreased range of motion right arm from cerebral palsy. Patient only able to move pinky and index finger left hand. Normal cap refill and pulse left hand.   Patient reports ETOH use last night.

## 2017-11-23 ENCOUNTER — Encounter: Payer: Self-pay | Admitting: Psychiatry

## 2017-11-23 DIAGNOSIS — S59292A Other physeal fracture of lower end of radius, left arm, initial encounter for closed fracture: Secondary | ICD-10-CM | POA: Diagnosis not present

## 2017-11-23 DIAGNOSIS — F1020 Alcohol dependence, uncomplicated: Secondary | ICD-10-CM

## 2017-11-23 MED ORDER — LORAZEPAM 2 MG/ML IJ SOLN: 1.0000 mg | Freq: Four times a day (QID) | INTRAMUSCULAR | Status: DC | PRN

## 2017-11-23 MED ORDER — RISPERIDONE 1 MG PO TABS
2.0000 mg | ORAL_TABLET | Freq: Every day | ORAL | Status: DC
  Administered 2017-11-23: 2 mg via ORAL
  Filled 2017-11-23 (×2): qty 2

## 2017-11-23 MED ORDER — LORAZEPAM 2 MG/ML IJ SOLN: 0.0000 mg | Freq: Two times a day (BID) | INTRAMUSCULAR | Status: DC

## 2017-11-23 MED ORDER — FOLIC ACID 1 MG PO TABS
1.0000 mg | ORAL_TABLET | Freq: Every day | ORAL | Status: DC
  Administered 2017-11-23 – 2017-11-24 (×2): 1 mg via ORAL
  Filled 2017-11-23 (×2): qty 1

## 2017-11-23 MED ORDER — GABAPENTIN 100 MG PO CAPS
100.0000 mg | ORAL_CAPSULE | Freq: Three times a day (TID) | ORAL | Status: DC
  Administered 2017-11-23 – 2017-11-24 (×2): 100 mg via ORAL
  Filled 2017-11-23 (×2): qty 1

## 2017-11-23 MED ORDER — VITAMIN B-1 100 MG PO TABS
100.0000 mg | ORAL_TABLET | Freq: Every day | ORAL | Status: DC
  Administered 2017-11-23 – 2017-11-24 (×2): 100 mg via ORAL
  Filled 2017-11-23 (×2): qty 1

## 2017-11-23 MED ORDER — LORAZEPAM 2 MG/ML IJ SOLN: 0.0000 mg | Freq: Four times a day (QID) | INTRAMUSCULAR | Status: DC

## 2017-11-23 MED ORDER — LORAZEPAM 1 MG PO TABS
1.0000 mg | ORAL_TABLET | Freq: Four times a day (QID) | ORAL | Status: DC | PRN
  Administered 2017-11-23 (×3): 1 mg via ORAL
  Filled 2017-11-23 (×3): qty 1

## 2017-11-23 MED ORDER — THIAMINE HCL 100 MG/ML IJ SOLN: 100.0000 mg | Freq: Every day | INTRAMUSCULAR | Status: DC

## 2017-11-23 MED ORDER — ADULT MULTIVITAMIN W/MINERALS CH
1.0000 | ORAL_TABLET | Freq: Every day | ORAL | Status: DC
  Administered 2017-11-23 – 2017-11-24 (×2): 1 via ORAL
  Filled 2017-11-23 (×2): qty 1

## 2017-11-23 NOTE — Evaluation (Addendum)
Physical Therapy Evaluation Patient Details Name: Sylvia [name] MRN: [id number] DOB: 06-Feb-1986 Today's Date: 11/23/2017   History of Present Illness  Sylvia [name] is a 32yo female who comes to [facility] after a fall at home while jumping hon her bed, sustained a fracture to her Left wrist. Pt underwent Left wrist ORIF on 10/5. 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 Pt lives alone in an apartment and has limited social support, does not drive.   Clinical Impression  Pt admitted with above diagnosis. Pt currently with functional limitations due to the deficits listed below (see "PT Problem List"). Upon entry, pt in bed, RN present. The pt is awake, hungry, and agreeable to participate. The pt is alert and oriented x3, pleasant, conversational, and following simple commands consistently. Pt c/o significant pain in LUE, much worse when elevation is ceased, but tolerates AMB with sling. Pt is assisted with adjustment of sling to allow for adequate limb support, but she is unable to do this herself d/t pain and hand dominance status. Pt performs all basic functional mobility without LOB, mild limitation from pain. Pt unlikely to be able to perform bathing, dressing, and other self care without assistance-would benefit from OT assessment. No additional skilled PT services needed at this time. PT signing off. Recommend continued AMB daily with nursing to prevent deconditioning.      Follow Up Recommendations No PT follow up    Equipment Recommendations  None recommended by PT    Recommendations for Other Services       Precautions / Restrictions Precautions Precautions: Fall Restrictions Weight Bearing Restrictions: Yes LUE Weight Bearing: Non weight bearing      Mobility  Bed Mobility Overal bed mobility: Modified Independent             General bed mobility comments: additional time  d/t pain   Transfers Overall transfer level: Independent               General transfer comment: additional time d/t pain; 2x no LOB noted   Ambulation/Gait Ambulation/Gait assistance: Supervision Gait Distance (Feet): 180 Feet Assistive device: None Gait Pattern/deviations: WFL(Within Functional Limits)     General Gait Details: no LOB, endorses high confidence; mild RLE assymetry likely related to CP diagnoss.   Stairs            Wheelchair Mobility    Modified Rankin (Stroke Patients Only)       Balance Overall balance assessment: Independent                                           Pertinent Vitals/Pain Pain Assessment: 0-10 Pain Score: 7  Pain Location: Left wrist and hand, reports some intermittent numbness in the 1st and 5th digits.  Pain Descriptors / Indicators: Aching;Tingling Pain Intervention(s): Limited activity within patient's tolerance;Repositioned    Home Living Family/patient expects to be discharged to:: Private residence Living Arrangements: Alone Available Help at Discharge: Neighbor("acquaintance" who is an old neighbor, and aunt who lives in [place] ) Type of Home: Apartment Home Access: Level entry     Home Layout: One level Home Equipment: None      Prior Function Level of Independence: Independent         Comments: walks to get groceries     Hand Dominance  Dominant Hand: Left    Extremity/Trunk Assessment   Upper Extremity Assessment Upper Extremity Assessment: (mild right toe walking/loss of dorsiflexion, likely chronic d/t CP )    Lower Extremity Assessment Lower Extremity Assessment: LLE deficits/detail LLE: Unable to fully assess due to immobilization;Unable to fully assess due to pain       Communication   Communication: No difficulties  Cognition Arousal/Alertness: Awake/alert Behavior During Therapy: WFL for tasks assessed/performed;Anxious(mildly anxious d/t ETOH withdrawl,  asking for next does of ativan) Overall Cognitive Status: Within Functional Limits for tasks assessed                                        General Comments      Exercises     Assessment/Plan    PT Assessment Patent does not need any further PT services  PT Problem List Decreased activity tolerance       PT Treatment Interventions Therapeutic activities    PT Goals (Current goals can be found in the Care Plan section)  Acute Rehab PT Goals Patient Stated Goal: wants more ativan, anxious PT Goal Formulation: All assessment and education complete, DC therapy    Frequency     Barriers to discharge        Co-evaluation               AM-PAC PT "6 Clicks" Daily Activity  Outcome Measure Difficulty turning over in bed (including adjusting bedclothes, sheets and blankets)?: A Little Difficulty moving from lying on back to sitting on the side of the bed? : A Little Difficulty sitting down on and standing up from a chair with arms (e.g., wheelchair, bedside commode, etc,.)?: A Lot Help needed moving to and from a bed to chair (including a wheelchair)?: None Help needed walking in hospital room?: None Help needed climbing 3-5 steps with a railing? : None 6 Click Score: 20    End of Session Equipment Utilized During Treatment: Gait belt;Other (comment)(sling) Activity Tolerance: Patient tolerated treatment well Patient left: in bed;with call bell/phone within reach;with SCD's reapplied(HOB elevated, refuses out of bed to chair. ) Nurse Communication: Mobility status PT Visit Diagnosis: Pain Pain - Right/Left: Left Pain - part of body: Arm;Hand    Time: 1610-9604 PT Time Calculation (min) (ACUTE ONLY): 22 min   Charges:   PT Evaluation $PT Eval Moderate Complexity: 1 Mod          11:35 AM, 11/23/17 Rosamaria Lints, PT, DPT Physical Therapist - [facility]  [phone] (ASCOM)    Haydin Dunn  C 11/23/2017, 11:32 AM

## 2017-11-23 NOTE — Plan of Care (Signed)
  Problem: Education: Goal: Knowledge of General Education information will improve Description: Including pain rating scale, medication(s)/side effects and non-pharmacologic comfort measures Outcome: Progressing   Problem: Health Behavior/Discharge Planning: Goal: Ability to manage health-related needs will improve Outcome: Progressing   Problem: Clinical Measurements: Goal: Will remain free from infection Outcome: Progressing   Problem: Activity: Goal: Risk for activity intolerance will decrease Outcome: Progressing   Problem: Coping: Goal: Level of anxiety will decrease Outcome: Progressing   Problem: Elimination: Goal: Will not experience complications related to bowel motility Outcome: Progressing   Problem: Pain Managment: Goal: General experience of comfort will improve Outcome: Progressing   Problem: Safety: Goal: Ability to remain free from injury will improve Outcome: Progressing   Problem: Skin Integrity: Goal: Risk for impaired skin integrity will decrease Outcome: Progressing   

## 2017-11-23 NOTE — Progress Notes (Signed)
Pt stating that she feels like she is starting to withdrawal from ETOH. Spoke with Dr. Anne Hahn MD to place orders.

## 2017-11-23 NOTE — Progress Notes (Signed)
[facility] - [place] at [facility]   PATIENT NAME: Sylvia [name]    MR#:  [id number]  DATE OF BIRTH:  February 20, 1985  SUBJECTIVE:  CHIEF COMPLAINT:   Chief Complaint  Patient presents with  . Fall  . Arm Injury   Left wrist pain. REVIEW OF SYSTEMS:  Review of Systems  Constitutional: Negative for chills, fever and malaise/fatigue.  HENT: Negative for sore throat.   Eyes: Negative for blurred vision and double vision.  Respiratory: Negative for cough, hemoptysis, shortness of breath, wheezing and stridor.   Cardiovascular: Negative for chest pain, palpitations, orthopnea and leg swelling.  Gastrointestinal: Negative for abdominal pain, blood in stool, diarrhea, melena, nausea and vomiting.  Genitourinary: Negative for dysuria, flank pain and hematuria.  Musculoskeletal: Negative for back pain and joint pain.  Skin: Negative for rash.  Neurological: Negative for dizziness, tremors, sensory change, focal weakness, seizures, loss of consciousness, weakness and headaches.  Endo/Heme/Allergies: Negative for polydipsia.  Psychiatric/Behavioral: Negative for depression. The patient is nervous/anxious.     DRUG ALLERGIES:   Allergies  Allergen Reactions  . Diphenhydramine Hcl Rash    Reaction:  Lowers Dilantin level   . Fluoxetine Other (See Comments)    Reaction:  Altered mental status   . Baclofen Other (See Comments)    Drops blood pressure, per pt  . Depakote [Divalproex Sodium] Other (See Comments)    Reaction:  Seizures and drops pts BP  . Quetiapine Other (See Comments)    Reaction:  Drops pts BP   VITALS:  Blood pressure 122/75, pulse 62, temperature 98.4 F (36.9 C), temperature source Oral, resp. rate 18, height 5\' 4"  (1.626 m), weight 56.7 kg, SpO2 100 %. PHYSICAL EXAMINATION:  Physical Exam  Constitutional: She is oriented to person, place, and time.  HENT:  Head: Normocephalic.  Mouth/Throat: Oropharynx is clear and moist.  Eyes: Pupils are  equal, round, and reactive to light. Conjunctivae and EOM are normal. No scleral icterus.  Neck: Normal range of motion. Neck supple. No JVD present. No tracheal deviation present.  Cardiovascular: Normal rate, regular rhythm and normal heart sounds. Exam reveals no gallop.  No murmur heard. Pulmonary/Chest: Effort normal and breath sounds normal. No respiratory distress. She has no wheezes. She has no rales.  Abdominal: Soft. Bowel sounds are normal. She exhibits no distension. There is no tenderness. There is no rebound.  Musculoskeletal: She exhibits no edema or tenderness.  Left hand and forearm in dressing  Neurological: She is alert and oriented to person, place, and time. No cranial nerve deficit.  Skin: No rash noted. No erythema.   LABORATORY PANEL:  Female CBC Recent Labs  Lab 11/22/17 0453  WBC 9.0  HGB 11.8*  HCT 33.2*  PLT 172   ------------------------------------------------------------------------------------------------------------------ Chemistries  Recent Labs  Lab 11/22/17 0453  NA 134*  K 4.6  CL 98  CO2 24  GLUCOSE 90  BUN 7  CREATININE 0.69  CALCIUM 8.7*   RADIOLOGY:  Dg Wrist Complete Left  Result Date: 11/22/2017 CLINICAL DATA:  Postop from ORIF. EXAM: LEFT WRIST - COMPLETE 3+ VIEW COMPARISON:  Preop radiographs, 11/22/2017 at 5:01 a.m. FINDINGS: Distal radial fracture has been reduced with a volar fixation plate and multiple screws. Radial length has been reduced to near anatomic. There is minimal residual displacement. Radial inclination is near anatomic. Ulnar fracture is normally aligned. IMPRESSION: 1. Well aligned comminuted distal radial fracture following ORIF. Electronically Signed   By: Amie Portland M.D.   On:  11/22/2017 19:21   ASSESSMENT AND PLAN:   Sylvia [name]  is a 32 y.o. female with a known history of polysubstance abuse, alcohol abuse, cerebral palsy, hep C due to use of IV drugs in the past, schizophrenia, depression and anxiety  presents to hospital after she had a fall and distal radial ulnar fracture.  1.  Left distal radial ulnar fracture S/p surgery. Pain control.  2.  Seizure disorder-continue Keppra.  3.  Depression and anxiety-again not taking any of her medicines for more than 7 months. started Celexa as she complains of anxiety episodes.  4.  Polysubstance abuse-urine tox screen.  Opiate positive  5.  Alcohol abuse disorder. On CIWA.  6.  Tobacco use disorder.  Smoking cessation was counseled for 3 to 4 minutes, on nicotine patch  I discussed with Dr. Martha Clan. All the records are reviewed and case discussed with Care Management/Social Worker. Management plans discussed with the patient, family and they are in agreement.  CODE STATUS: Full Code  TOTAL TIME TAKING CARE OF THIS PATIENT: 27 minutes.   More than 50% of the time was spent in counseling/coordination of care: YES  POSSIBLE D/C IN 1 DAYS, DEPENDING ON CLINICAL CONDITION.   Shaune Pollack M.D on 11/23/2017 at 1:05 PM  Between 7am to 6pm - Pager - [phone]  After 6pm go to [email or web address] - Therapist, [facility] Hospitalists

## 2017-11-23 NOTE — Consult Note (Signed)
[facility] Face-to-Face Psychiatry Consult   Reason for Consult:  Hx of schizophrenia and off meds.  Referring Physician:  Dr. Thornton Park Patient Identification: Sylvia [name] MRN:  [id number] Principal Diagnosis: Radius fracture Diagnosis:   Patient Active Problem List   Diagnosis Date Noted  . Radius fracture [S52.90XA] 11/22/2017  . Substance induced mood disorder ([place]) [F19.94] 07/24/2016  . 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 Cocaine use disorder, moderate, dependence ([place]) [F14.20] 03/17/2015  . Cannabis use disorder, mild, abuse [F12.10] 03/17/2015  . Alcohol use disorder, severe, dependence ([place]) [F10.20] 03/17/2015  . Schizoaffective disorder, bipolar type ([place]) [F25.0] 03/16/2015  . Seizure disorder ([place]) [B26.203] 09/17/2013  . Cellulitis and abscess of toe of right foot [L03.031, L02.611] 09/16/2013  . Hepatitis C [B19.20]   . CP (cerebral palsy) ([place]) [G80.9]     Total Time spent with patient: 1.5 hours  Subjective:   Sylvia [name] is a 32 y.o. female patient with alcohol use disorder, admitted for fracture of her Radius after she fell out of bed while she was drank.   HPI:   Sylvia [name] is a 32 yo WF with severe alcohol use disorder,  Hepatitis C, seizure disorder, and cerebral palsy with limited use of her R Upper extremity, admitted for Radius fracture when she fell out of bed while drank.    Sylvia [name] is seen in the room. She is calm and cooperative.  She admitted that she has been drinking heavily since she was released from [place] on 11/14/17. Since then, she has been drinking between 12-24 beers a day. She said that she has withdrawal seizure before.   She said that she was jumping on her bed while she was drank, and fell and broke her arm this time. She understands that she needs to stop drinking.  She denied active using other illicit drugs. She did cocaine in the past  She denied psychotic Sx.  She said that the only time she had hallucinations was in 2013,  when she claimed that she was in solitary confinement for one year.  She denied taking any anti psychotic, and refused the perphenazine that was ordered for her.    She denied Si or HI, and stated that she just started celexa in this hospital and is willing to continue.  She lives in a place supported by [facility] and is followed by a ACT service at [facility].  She said that her current meds are gabapentin, and ativan.  She is informed that the ativan is only for withdrawal.   Past Psychiatric History:  Hx of alcohol, cocaine use.  ? Schizophrenia Dx.  She is followed by ACT, which likely has more history.   Risk to Self:   Risk to Others:   Prior Inpatient Therapy:   Prior Outpatient Therapy:    Past Medical History:  Past Medical History:  Diagnosis Date  . Alcoholism ([place])   . CP (cerebral palsy) ([place])   . Hepatitis C   . Herpes   . Polysubstance abuse ([place])   . Schizoaffective schizophrenia ([place]) 09/17/2013   Per notes from [facility] as on 2014   . Seizures ([place])   . Stroke [facility])     Past Surgical History:  Procedure Laterality Date  . 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 FOOT SURGERY     rt and left  . LEG  SURGERY Right    x7   Family History:  Family History  Problem Relation Age of Onset  . Bipolar disorder Mother   . Cancer Maternal Grandmother   . Diabetes Maternal Grandfather   . Hypertension Maternal Grandfather    Family Psychiatric  History: unknown Social History:  Social History   Substance and Sexual Activity  Alcohol Use Yes  . Alcohol/week: 3.0 standard drinks  . Types: 3 Cans of beer per week   Comment: Per patient 0.5 gallon to gallon when she can but since being in group home she can't do it as often      Social History   Substance and Sexual Activity  Drug Use Yes  . Types: "Crack" cocaine,  Marijuana, Cocaine   Comment: Per patient uses herion and crack and cocaine "once in a blue moon"    Social History   Socioeconomic History  . Marital status: Single    Spouse name: Not on file  . Number of children: Not on file  . Years of education: Not on file  . Highest education level: Not on file  Occupational History  . Not on file  Social Needs  . Financial resource strain: Not on file  . Food insecurity:    Worry: Not on file    Inability: Not on file  . Transportation needs:    Medical: Not on file    Non-medical: Not on file  Tobacco Use  . Smoking status: Current Every Day Smoker    Packs/day: 1.00    Years: 20.00    Pack years: 20.00    Types: Cigarettes  . Smokeless tobacco: Former Network engineer and Sexual Activity  . Alcohol use: Yes    Alcohol/week: 3.0 standard drinks    Types: 3 Cans of beer per week    Comment: Per patient 0.5 gallon to gallon when she can but since being in group home she can't do it as often   . Drug use: Yes    Types: "Crack" cocaine, Marijuana, Cocaine    Comment: Per patient uses herion and crack and cocaine "once in a blue moon"  . Sexual activity: Not Currently    Birth control/protection: Implant  Lifestyle  . Physical activity:    Days per week: Not on file    Minutes per session: Not on file  . Stress: Not on file  Relationships  . Social connections:    Talks on phone: Not on file    Gets together: Not on file    Attends religious service: Not on file    Active member of club or organization: Not on file    Attends meetings of clubs or organizations: Not on file    Relationship status: Not on file  Other Topics Concern  . Not on file  Social History Narrative   Lives at home by herself, only has acquaintances here   Does not work     Allergies:   Allergies  Allergen Reactions  . Diphenhydramine Hcl Rash    Reaction:  Lowers Dilantin level   . Fluoxetine Other (See Comments)    Reaction:  Altered mental  status   . Baclofen Other (See Comments)    Drops blood pressure, per Sylvia [name]  . Depakote [Divalproex Sodium] Other (See Comments)    Reaction:  Seizures and drops pts BP  . Quetiapine Other (See Comments)    Reaction:  Drops pts BP    Labs:  Results for orders placed or performed  during the hospital encounter of 11/22/17 (from the past 48 hour(s))  CBC     Status: Abnormal   Collection Time: 11/22/17  4:53 AM  Result Value Ref Range   WBC 9.0 3.6 - 11.0 K/uL   RBC 3.48 (L) 3.80 - 5.20 MIL/uL   Hemoglobin 11.8 (L) 12.0 - 16.0 g/dL   HCT 33.2 (L) 35.0 - 47.0 %   MCV 95.3 80.0 - 100.0 fL   MCH 33.7 26.0 - 34.0 pg   MCHC 35.4 32.0 - 36.0 g/dL   RDW 13.0 11.5 - 14.5 %   Platelets 172 150 - 440 K/uL    Comment: Performed at [facility], [address] [place], [place] [address]  Basic metabolic panel     Status: Abnormal   Collection Time: 11/22/17  4:53 AM  Result Value Ref Range   Sodium 134 (L) 135 - 145 mmol/L   Potassium 4.6 3.5 - 5.1 mmol/L    Comment: HEMOLYSIS AT THIS LEVEL MAY AFFECT RESULT   Chloride 98 98 - 111 mmol/L   CO2 24 22 - 32 mmol/L   Glucose, Bld 90 70 - 99 mg/dL   BUN 7 6 - 20 mg/dL   Creatinine, Ser 0.69 0.44 - 1.00 mg/dL   Calcium 8.7 (L) 8.9 - 10.3 mg/dL   GFR calc non Af Amer >60 >60 mL/min   GFR calc Af Amer >60 >60 mL/min    Comment: (NOTE) The eGFR has been calculated using the CKD EPI equation. This calculation has not been validated in all clinical situations. eGFR's persistently <60 mL/min signify possible Chronic Kidney Disease.    Anion gap 12 5 - 15    Comment: Performed at [facility], [address] [place], [place] [address]  Ethanol     Status: None   Collection Time: 11/22/17  4:53 AM  Result Value Ref Range   Alcohol, Ethyl (B) <10 <10 mg/dL    Comment: (NOTE) Lowest detectable limit for serum alcohol is 10 mg/dL. For medical purposes only. Performed at [facility], [address]  [place], [place] [address]   APTT     Status: None   Collection Time: 11/22/17 10:00 AM  Result Value Ref Range   aPTT 29 24 - 36 seconds    Comment: Performed at [facility], [address] [place], [place] [address]  Protime-INR     Status: None   Collection Time: 11/22/17 10:00 AM  Result Value Ref Range   Prothrombin Time 12.5 11.4 - 15.2 seconds   INR 0.94     Comment: Performed at [facility], [address] [place], [place] [address]  Urine Drug Screen, Qualitative ([place] only)     Status: Abnormal   Collection Time: 11/22/17 10:46 AM  Result Value Ref Range   Tricyclic, Ur Screen NONE DETECTED NONE DETECTED   Amphetamines, Ur Screen NONE DETECTED NONE DETECTED   MDMA (Ecstasy)Ur Screen NONE DETECTED NONE DETECTED   Cocaine Metabolite,Ur [place] NONE DETECTED NONE DETECTED   Opiate, Ur Screen POSITIVE (A) NONE DETECTED   Phencyclidine (PCP) Ur S NONE DETECTED NONE DETECTED   Cannabinoid 50 Ng, Ur [place] NONE DETECTED NONE DETECTED   Barbiturates, Ur Screen NONE DETECTED NONE DETECTED   Benzodiazepine, Ur Scrn NONE DETECTED NONE DETECTED   Methadone Scn, Ur NONE DETECTED NONE DETECTED    Comment: (NOTE) Tricyclics + metabolites, urine    Cutoff 1000 ng/mL Amphetamines + metabolites, urine  Cutoff 1000 ng/mL MDMA (  Ecstasy), urine              Cutoff 500 ng/mL Cocaine Metabolite, urine          Cutoff 300 ng/mL Opiate + metabolites, urine        Cutoff 300 ng/mL Phencyclidine (PCP), urine         Cutoff 25 ng/mL Cannabinoid, urine                 Cutoff 50 ng/mL Barbiturates + metabolites, urine  Cutoff 200 ng/mL Benzodiazepine, urine              Cutoff 200 ng/mL Methadone, urine                   Cutoff 300 ng/mL The urine drug screen provides only a preliminary, unconfirmed analytical test result and should not be used for non-medical purposes. Clinical consideration and professional judgment should be applied to any positive drug screen result due to  possible interfering substances. A more specific alternate chemical method must be used in order to obtain a confirmed analytical result. Gas chromatography / mass spectrometry (GC/MS) is the preferred confirmat ory method. Performed at [facility], [address] [place], [place] [address]   Pregnancy, urine     Status: None   Collection Time: 11/22/17 10:46 AM  Result Value Ref Range   Preg Test, Ur NEGATIVE NEGATIVE    Comment: Performed at [facility], [address] [place], [place] [address]  Surgical pcr screen     Status: None   Collection Time: 11/22/17 12:27 PM  Result Value Ref Range   MRSA, PCR NEGATIVE NEGATIVE   Staphylococcus aureus NEGATIVE NEGATIVE    Comment: (NOTE) The Xpert SA Assay (FDA approved for NASAL specimens in patients 3 years of age and older), is one component of a comprehensive surveillance program. It is not intended to diagnose infection nor to guide or monitor treatment. Performed at [facility], [address], [place], [place] [address]     Current Facility-Administered Medications  Medication Dose Route Frequency Provider Last Rate Last Dose  . 0.9 %  sodium chloride infusion   Intravenous Continuous Thornton Park, MD 75 mL/hr at 11/23/17 0349    . benztropine (COGENTIN) tablet 0.5 mg  0.5 mg Oral QHS Thornton Park, MD   0.5 mg at 11/22/17 2124  . bisacodyl (DULCOLAX) EC tablet 10 mg  10 mg Oral Daily PRN Thornton Park, MD      . cholecalciferol (VITAMIN D) tablet 1,000 Units  1,000 Units Oral Daily Thornton Park, MD   1,000 Units at 11/23/17 0944  . citalopram (CELEXA) tablet 20 mg  20 mg Oral Daily Thornton Park, MD   20 mg at 11/23/17 0945  . docusate sodium (COLACE) capsule 100 mg  100 mg Oral BID Thornton Park, MD   100 mg at 11/23/17 0945  . folic acid (FOLVITE) tablet 1 mg  1 mg Oral Daily Lance Coon, MD   1 mg at 11/23/17 0945  . HYDROmorphone (DILAUDID) injection 0.5-1 mg   0.5-1 mg Intravenous Q2H PRN Thornton Park, MD      . hydrOXYzine (ATARAX/VISTARIL) tablet 25 mg  25 mg Oral Q8H PRN Thornton Park, MD      . levETIRAcetam (KEPPRA) tablet 250 mg  250 mg Oral BID Thornton Park, MD   250 mg at 11/23/17 0945  . LORazepam (ATIVAN) injection 0-4 mg  0-4 mg Intravenous Q6H Lance Coon, MD  Followed by  . [START ON 11/25/2017] LORazepam (ATIVAN) injection 0-4 mg  0-4 mg Intravenous Laurence Spates, MD      . LORazepam (ATIVAN) tablet 1 mg  1 mg Oral Q6H PRN Lance Coon, MD   1 mg at 11/23/17 1126   Or  . LORazepam (ATIVAN) injection 1 mg  1 mg Intravenous Q6H PRN Lance Coon, MD      . magnesium citrate solution 1 Bottle  1 Bottle Oral Once PRN Thornton Park, MD      . methocarbamol (ROBAXIN) tablet 500 mg  500 mg Oral Q6H PRN Thornton Park, MD       Or  . methocarbamol (ROBAXIN) 500 mg in dextrose 5 % 50 mL IVPB  500 mg Intravenous Q6H PRN Thornton Park, MD      . multivitamin with minerals tablet 1 tablet  1 tablet Oral Daily Lance Coon, MD   1 tablet at 11/23/17 0945  . nicotine (NICODERM CQ - dosed in mg/24 hours) patch 21 mg  21 mg Transdermal Daily Thornton Park, MD   21 mg at 11/23/17 0946  . ondansetron (ZOFRAN) tablet 4 mg  4 mg Oral Q6H PRN Thornton Park, MD       Or  . ondansetron [facility]) injection 4 mg  4 mg Intravenous Q6H PRN Thornton Park, MD      . oxyCODONE (Oxy IR/ROXICODONE) immediate release tablet 5-10 mg  5-10 mg Oral Q3H PRN Thornton Park, MD   10 mg at 11/23/17 0945  . perphenazine (TRILAFON) tablet 2 mg  2 mg Oral q morning - 10a Thornton Park, MD      . perphenazine (TRILAFON) tablet 6 mg  6 mg Oral QHS Thornton Park, MD      . polyethylene glycol (MIRALAX / GLYCOLAX) packet 17 g  17 g Oral Daily PRN Thornton Park, MD      . thiamine (VITAMIN B-1) tablet 100 mg  100 mg Oral Daily Lance Coon, MD   100 mg at 11/23/17 0945   Or  . thiamine (B-1) injection 100 mg  100 mg Intravenous  Daily Lance Coon, MD        Musculoskeletal: Strength & Muscle Tone: not examed Gait & Station: not examed Patient leans: N/A  Psychiatric Specialty Exam: Physical Exam  ROS  Blood pressure 122/75, pulse 62, temperature 98.4 F (36.9 C), temperature source Oral, resp. rate 18, height '5\' 4"'  (1.626 m), weight 56.7 kg, SpO2 100 %.Body mass index is 21.46 kg/m.  General Appearance: Casual  Eye Contact:  Good  Speech:  Clear and Coherent  Volume:  Normal  Mood:  Euthymic  Affect:  Appropriate, Congruent and Constricted  Thought Process:  Coherent and Goal Directed  Orientation:  Full (Time, Place, and Person)  Thought Content:  Logical  Suicidal Thoughts:  No  Homicidal Thoughts:  No  Memory:  Immediate;   Fair  Judgement:  Fair  Insight:  Fair  Psychomotor Activity:  Normal  Concentration:  Concentration: Fair and Attention Span: Fair  Recall:  Good  Fund of Knowledge:  Fair  Language:  Good        Assets:  Communication Skills Resilience  ADL's:  Intact  Cognition:  WNL  Sleep:        Treatment Plan Summary: Daily contact with patient to assess and evaluate symptoms and progress in treatment and Medication management  # Alcohol use disorder -- continue CIWA with PRN ativan.  -- add gabapentin 163m TID, per Sylvia [name]'s request.   #  Mood disorder -- continue celexa 25m daily.   # psychotic disorder, per history. Not sure if she has schizophrenia.  -- need to contact ACT for more history.  Currently no psychotic Sx.  -- Sylvia [name] refused Perphenazine at this time. But not actully psychotic.  -- restart low dose of risperdal 261mqhs for now.   # Dispo -- no IVC indicated.  -- need to coordinate with her ACT on discharge.  -- psych will follow daily.    Disposition: No evidence of imminent risk to self or others at present.    Leyanna Bittman, MD 11/23/2017 3:22 PM

## 2017-11-23 NOTE — Progress Notes (Signed)
Pt remaining alert and oriented this shift. Moving fingers this shift. Fingers still warm to the touch. Pain control with oral pain medication. anxious at times. CIWA protocol ordered.

## 2017-11-23 NOTE — Progress Notes (Signed)
Subjective:  POD#1 s/p ORIF of left distal radius fracture and open carpal tunnel release.  Patient reports left wrist pain as moderate.  Still has paresthesias in the index middle and right fingers.   Patient says she has neuropathy at baseline.  She also clarified that she was having numbness in these fingers after her fall but prior to surgery.  She could feel me touching her on exam prior to surgery but her fingers have felt "numb and tingling" after her fall.  Objective:   VITALS:   Vitals:   11/22/17 2215 11/22/17 2328 11/23/17 0310 11/23/17 0812  BP: 113/73 111/72 106/73 122/75  Pulse: 75 71 78 62  Resp: 16 12 12 18   Temp: 97.7 F (36.5 C) 98.9 F (37.2 C) 98.2 F (36.8 C) 98.4 F (36.9 C)  TempSrc: Oral Oral Oral Oral  SpO2: 99% 100% 100% 100%  Weight:      Height:        PHYSICAL EXAM: Left upper extremity:  Splint and dressing is C/D/I.  She can flex and extend her fingers better today but motion is still limited due to pain.   Fingers are well perfused.  She states she can feel me touching her fingers but they still feel "tingly".   LABS  Results for orders placed or performed during the hospital encounter of 11/22/17 (from the past 24 hour(s))  APTT     Status: None   Collection Time: 11/22/17 10:00 AM  Result Value Ref Range   aPTT 29 24 - 36 seconds  Protime-INR     Status: None   Collection Time: 11/22/17 10:00 AM  Result Value Ref Range   Prothrombin Time 12.5 11.4 - 15.2 seconds   INR 0.94   Urine Drug Screen, Qualitative ([facility] only)     Status: Abnormal   Collection Time: 11/22/17 10:46 AM  Result Value Ref Range   Tricyclic, Ur Screen NONE DETECTED NONE DETECTED   Amphetamines, Ur Screen NONE DETECTED NONE DETECTED   MDMA (Ecstasy)Ur Screen NONE DETECTED NONE DETECTED   Cocaine Metabolite,Ur [place] NONE DETECTED NONE DETECTED   Opiate, Ur Screen POSITIVE (A) NONE DETECTED   Phencyclidine (PCP) Ur S NONE DETECTED NONE DETECTED   Cannabinoid 50 Ng, Ur [place]  NONE DETECTED NONE DETECTED   Barbiturates, Ur Screen NONE DETECTED NONE DETECTED   Benzodiazepine, Ur Scrn NONE DETECTED NONE DETECTED   Methadone Scn, Ur NONE DETECTED NONE DETECTED  Pregnancy, urine     Status: None   Collection Time: 11/22/17 10:46 AM  Result Value Ref Range   Preg Test, Ur NEGATIVE NEGATIVE  Surgical pcr screen     Status: None   Collection Time: 11/22/17 12:27 PM  Result Value Ref Range   MRSA, PCR NEGATIVE NEGATIVE   Staphylococcus aureus NEGATIVE NEGATIVE    Dg Forearm Left  Result Date: 11/22/2017 CLINICAL DATA:  Post fall with left arm pain and deformity. EXAM: LEFT FOREARM - 2 VIEW COMPARISON:  None. FINDINGS: Displaced distal radial metaphyseal fracture with 3/4 shaft width dorsal displacement and mild apex volar angulation. Fracture involves the distal radioulnar joint. Possible but not definitive radiocarpal involvement. There is a minimally displaced ulna styloid fracture. Additional nondisplaced incomplete fracture of the distal ulnar shaft. Proximal radius and ulna are intact. Diffuse soft tissue edema about the wrist. IMPRESSION: 1. Distal radial metaphyseal fracture with significant displacement, mild apex volar angulation and radiocarpal joint involvement. 2. Minimally displaced ulna styloid fracture. Additional nondisplaced distal ulnar shaft fracture. Electronically Signed  By: Narda Rutherford M.D.   On: 11/22/2017 05:18   Dg Wrist Complete Left  Result Date: 11/22/2017 CLINICAL DATA:  Postop from ORIF. EXAM: LEFT WRIST - COMPLETE 3+ VIEW COMPARISON:  Preop radiographs, 11/22/2017 at 5:01 a.m. FINDINGS: Distal radial fracture has been reduced with a volar fixation plate and multiple screws. Radial length has been reduced to near anatomic. There is minimal residual displacement. Radial inclination is near anatomic. Ulnar fracture is normally aligned. IMPRESSION: 1. Well aligned comminuted distal radial fracture following ORIF. Electronically Signed   By:  Amie Portland M.D.   On: 11/22/2017 19:21   Dg Humerus Left  Result Date: 11/22/2017 CLINICAL DATA:  Post fall with left arm pain and deformity. EXAM: LEFT HUMERUS - 2+ VIEW COMPARISON:  None. FINDINGS: Cortical margins of the humerus are intact. No acute fracture. Elbow and shoulder alignment are maintained. No focal soft tissue abnormality. Nexplanon visualized in the mid arm. IMPRESSION: Negative radiograph the left humerus. Electronically Signed   By: Narda Rutherford M.D.   On: 11/22/2017 05:19    Assessment/Plan: 1 Day Post-Op   Active Problems:   Radius fracture  Patient stable post-op.  She had significant swelling and ecchymosis in her right right during surgery.  A carpal tunnel release was performed due to concerns that she would develop acute compartment syndrome.  Patient will remain in the hospital overnight to continue close neurovascular monitoring.   Her dressing well be removed tomorrow to check the incision and a cast applied before discharge tomorrow.    Juanell Fairly , MD 11/23/2017, 8:53 AM

## 2017-11-24 ENCOUNTER — Encounter: Payer: Self-pay | Admitting: Orthopedic Surgery

## 2017-11-24 DIAGNOSIS — S59292A Other physeal fracture of lower end of radius, left arm, initial encounter for closed fracture: Secondary | ICD-10-CM | POA: Diagnosis not present

## 2017-11-24 LAB — HIV ANTIBODY (ROUTINE TESTING W REFLEX): HIV Screen 4th Generation wRfx: NONREACTIVE

## 2017-11-24 MED ORDER — OXYCODONE HCL 5 MG PO TABS: 5.0000 mg | ORAL_TABLET | ORAL | 0 refills | Status: DC | PRN

## 2017-11-24 MED ORDER — DOCUSATE SODIUM 100 MG PO CAPS: 100.0000 mg | ORAL_CAPSULE | Freq: Two times a day (BID) | ORAL | 0 refills | Status: DC

## 2017-11-24 MED ORDER — RISPERIDONE 2 MG PO TABS: 2.0000 mg | ORAL_TABLET | Freq: Every day | ORAL | 0 refills | Status: DC

## 2017-11-24 NOTE — Plan of Care (Signed)
  Problem: Education: Goal: Knowledge of General Education information will improve Description: Including pain rating scale, medication(s)/side effects and non-pharmacologic comfort measures Outcome: Progressing   Problem: Health Behavior/Discharge Planning: Goal: Ability to manage health-related needs will improve Outcome: Progressing   Problem: Clinical Measurements: Goal: Will remain free from infection Outcome: Progressing   Problem: Activity: Goal: Risk for activity intolerance will decrease Outcome: Progressing   Problem: Coping: Goal: Level of anxiety will decrease Outcome: Progressing   Problem: Elimination: Goal: Will not experience complications related to bowel motility Outcome: Progressing   Problem: Pain Managment: Goal: General experience of comfort will improve Outcome: Progressing   Problem: Safety: Goal: Ability to remain free from injury will improve Outcome: Progressing   Problem: Skin Integrity: Goal: Risk for impaired skin integrity will decrease Outcome: Progressing   

## 2017-11-24 NOTE — Progress Notes (Signed)
Chaplain was rounding and was referred by SW. Pt was ready for d/c. Pt said "people are changing". Chaplain attempted to engage Pt but she was evasive, Chaplain let pt know she should take care of herself.    11/24/17 1500  Clinical Encounter Type  Visited With Patient  Visit Type Initial  Referral From Social work  Spiritual Encounters  Spiritual Needs Emotional

## 2017-11-24 NOTE — Care Management Note (Addendum)
Case Management Note  Patient Details  Name: Sylvia [name] MRN: [id number] Date of Birth: Oct 12, 1985  Subjective/Objective:    Admitted to [facility] with the diagnosis of radius fracture post fall. Lives alone. Will be seeing Dr. Martha Clan for follow-up care. Lives alone. ACT team helps with medications. Darrick Penna is friend [phone]).                 Action/Plan: Occupational therapy is recommending therapy in the home. Discussed agencies with [name]. Chose [facility]. Feliberto Gottron, [facility] representative updated.  No transportation home. Taxi voucher issued   Expected Discharge Date:  11/24/17               Expected Discharge Plan:     In-House Referral:   yes  Discharge planning Services   yes  Post Acute Care Choice:   yes Choice offered to:   patient  DME Arranged:    DME Agency:     HH Arranged:   yes HH Agency:   [facility]  Status of Service:     If discussed at Long Length of Stay Meetings, dates discussed:    Additional Comments:  Gwenette Greet, RN MSN CCM Care Management [phone] 11/24/2017, 1:05 PM

## 2017-11-24 NOTE — Evaluation (Addendum)
Occupational Therapy Evaluation Patient Details Name: Sylvia [name] MRN: [id number] DOB: Oct 28, 1985 Today's Date: 11/24/2017    History of Present Illness Sylvia [name] is a 31yo female who comes to [facility] after a fall at home while jumping hon her bed, sustained a fracture to her Left wrist. Pt underwent Left wrist ORIF on 10/5. 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 Pt lives alone in an apartment and has limited social support, does not drive.    Clinical Impression   Pt seen for evaluation this date. Pt was agreeable to therapy but reported pain throughout session. Pt is a 32 y/o female who presents with a distal radius fracture after a fall at home on her bed. Pt currently lives alone in an apartment, does not drive and walks to get her groceries. Pt able to verbally recall her wrist precautions but noted her arm was not elevated at start of OT session requiring OT to readjust. Currently, pt demonstrates impairments in pain, edema, strength and ROM requiring AE, positioning techniques, and minimal assistance for dressing, bathing, and grooming ADLs. Pt was educated in edema management and positioning techniques to increase pt safety and recovery of LUE. Pt verbalized understanding to all education. Pt will benefit from skilled OT services to address noted impairments and functional deficits in order to maximize safety and independence and minimize falls risk and caregiver burden. OT recommends HHOT upon discharge.    Follow Up Recommendations  Home health OT    Equipment Recommendations  Other (comment)(consider adaptive utensils )    Recommendations for Other Services       Precautions / Restrictions Precautions Precaution Comments: LUE - NWB need to keep elevated Required Braces or Orthoses: Sling Restrictions Weight Bearing Restrictions: Yes LUE Weight Bearing: Non weight bearing       Mobility Bed Mobility Overal bed mobility: Needs Assistance             General bed mobility comments: Pt declined. Per PT eval previous date, Pt MOD I   Transfers                 General transfer comment: Pt declined. Per PT eval previous date, Pt independent    Balance Overall balance assessment: (Pt declined. Per PT eval previous date, Pt indep)                                         ADL either performed or assessed with clinical judgement   ADL Overall ADL's : Needs assistance/impaired Eating/Feeding: Sitting;Modified independent   Grooming: Moderate assistance   Upper Body Bathing: Moderate assistance;Sitting   Lower Body Bathing: Moderate assistance;Sit to/from stand   Upper Body Dressing : Moderate assistance;Sitting   Lower Body Dressing: Moderate assistance;Sit to/from stand       Toileting- Architect and Hygiene: Moderate assistance       Functional mobility during ADLs: Supervision/safety       Vision Baseline Vision/History: No visual deficits Patient Visual Report: No change from baseline       Perception     Praxis      Pertinent Vitals/Pain Pain Assessment: 0-10 Pain Score: 7  Pain Location: Left wrist and hand, reports some tingling in 1st three digits on left hand Pain Descriptors / Indicators: Aching;Tingling Pain Intervention(s): Limited activity within patient's tolerance;Patient  requesting pain meds-RN notified;Repositioned     Hand Dominance Left   Extremity/Trunk Assessment Upper Extremity Assessment Upper Extremity Assessment: LUE deficits/detail;RUE deficits/detail RUE Deficits / Details: Pt experiences chronic right UE impairments with strength, ROM, and flexibility 2/2 CP LUE: Unable to fully assess due to pain   Lower Extremity Assessment Lower Extremity Assessment: Defer to PT evaluation       Communication Communication Communication: No difficulties   Cognition  Arousal/Alertness: Awake/alert Behavior During Therapy: WFL for tasks assessed/performed Overall Cognitive Status: Within Functional Limits for tasks assessed                                     General Comments       Exercises Other Exercises Other Exercises: Pt educated on one sided hand upper and lower body dressing techniques. Pt declined practice due to pain.  Other Exercises: Pt educated in and verbally demonstrated understanding edema mgmt strategies.    Shoulder Instructions      Home Living Family/patient expects to be discharged to:: Private residence Living Arrangements: Alone Available Help at Discharge: Neighbor Type of Home: Apartment Home Access: Level entry     Home Layout: One level     Bathroom Shower/Tub: Chief Strategy Officer: Standard     Home Equipment: None          Prior Functioning/Environment Level of Independence: Independent        Comments: Does not drive, currently unemployed, walks to get groceries at store across street        OT Problem List: Decreased strength;Pain;Decreased range of motion;Decreased safety awareness;Increased edema;Decreased activity tolerance;Impaired UE functional use;Decreased knowledge of use of DME or AE      OT Treatment/Interventions: Self-care/ADL training;Therapeutic activities;Therapeutic exercise;DME and/or AE instruction;Patient/family education    OT Goals(Current goals can be found in the care plan section) Acute Rehab OT Goals Patient Stated Goal: get rid of pain OT Goal Formulation: With patient Time For Goal Achievement: 12/08/17 Potential to Achieve Goals: Good ADL Goals Pt Will Perform Grooming: with modified independence(Pt will complete grooming tasks, such as brushing hair and teeth, with modified independence) Pt Will Perform Upper Body Bathing: with min guard assist;sitting;with adaptive equipment(Pt will complete upper body bathing while sitting using AE  with MIN guard assist) Pt Will Perform Upper Body Dressing: with min assist;sitting(Pt will complete upper body dressing while sitting with MIN assist)  OT Frequency: Min 2X/week   Barriers to D/C:            Co-evaluation              AM-PAC PT "6 Clicks" Daily Activity     Outcome Measure Help from another person eating meals?: None Help from another person taking care of personal grooming?: A Little Help from another person toileting, which includes using toliet, bedpan, or urinal?: A Little Help from another person bathing (including washing, rinsing, drying)?: A Lot Help from another person to put on and taking off regular upper body clothing?: A Lot Help from another person to put on and taking off regular lower body clothing?: A Lot 6 Click Score: 16   End of Session    Activity Tolerance: Patient limited by pain Patient left: in bed;with call bell/phone within reach;with bed alarm set  OT Visit Diagnosis: Pain Pain - Right/Left: Left Pain - part of body: Arm  Time: 5784-6962 OT Time Calculation (min): 17 min Charges:     This entire session was guided, instructed and directly supervised by Richrd Prime, OTR/L.  Amen Staszak OTS 11/24/2017, 1:25 PM

## 2017-11-24 NOTE — Progress Notes (Signed)
[facility] -  at [facility]   PATIENT NAME: Sylvia [name]    MR#:  [id number]  DATE OF BIRTH:  October 24, 1985  SUBJECTIVE:  CHIEF COMPLAINT:   Chief Complaint  Patient presents with  . Fall  . Arm Injury   Left wrist pain. REVIEW OF SYSTEMS:  Review of Systems  Constitutional: Negative for chills, fever and malaise/fatigue.  HENT: Negative for sore throat.   Eyes: Negative for blurred vision and double vision.  Respiratory: Negative for cough, hemoptysis, shortness of breath, wheezing and stridor.   Cardiovascular: Negative for chest pain, palpitations, orthopnea and leg swelling.  Gastrointestinal: Negative for abdominal pain, blood in stool, diarrhea, melena, nausea and vomiting.  Genitourinary: Negative for dysuria, flank pain and hematuria.  Musculoskeletal: Negative for back pain and joint pain.  Skin: Negative for rash.  Neurological: Negative for dizziness, tremors, sensory change, focal weakness, seizures, loss of consciousness, weakness and headaches.  Endo/Heme/Allergies: Negative for polydipsia.  Psychiatric/Behavioral: Negative for depression. The patient is not nervous/anxious.     DRUG ALLERGIES:   Allergies  Allergen Reactions  . Diphenhydramine Hcl Rash    Reaction:  Lowers Dilantin level   . Fluoxetine Other (See Comments)    Reaction:  Altered mental status   . Baclofen Other (See Comments)    Drops blood pressure, per pt  . Depakote [Divalproex Sodium] Other (See Comments)    Reaction:  Seizures and drops pts BP  . Quetiapine Other (See Comments)    Reaction:  Drops pts BP   VITALS:  Blood pressure (!) 123/93, pulse 85, temperature 98 F (36.7 C), temperature source Oral, resp. rate 20, height 5\' 4"  (1.626 m), weight 56.7 kg, SpO2 100 %. PHYSICAL EXAMINATION:  Physical Exam  Constitutional: She is oriented to person, place, and time.  HENT:  Head: Normocephalic.  Mouth/Throat: Oropharynx is clear and moist.  Eyes:  Pupils are equal, round, and reactive to light. Conjunctivae and EOM are normal. No scleral icterus.  Neck: Normal range of motion. Neck supple. No JVD present. No tracheal deviation present.  Cardiovascular: Normal rate, regular rhythm and normal heart sounds. Exam reveals no gallop.  No murmur heard. Pulmonary/Chest: Effort normal and breath sounds normal. No respiratory distress. She has no wheezes. She has no rales.  Abdominal: Soft. Bowel sounds are normal. She exhibits no distension. There is no tenderness. There is no rebound.  Musculoskeletal: She exhibits no edema or tenderness.  Left hand and forearm in dressing  Neurological: She is alert and oriented to person, place, and time. No cranial nerve deficit.  Skin: No rash noted. No erythema.   LABORATORY PANEL:  Female CBC Recent Labs  Lab 11/22/17 0453  WBC 9.0  HGB 11.8*  HCT 33.2*  PLT 172   ------------------------------------------------------------------------------------------------------------------ Chemistries  Recent Labs  Lab 11/22/17 0453  NA 134*  K 4.6  CL 98  CO2 24  GLUCOSE 90  BUN 7  CREATININE 0.69  CALCIUM 8.7*   RADIOLOGY:  No results found. ASSESSMENT AND PLAN:   Sylvia [name]  is a 32 y.o. female with a known history of polysubstance abuse, alcohol abuse, cerebral palsy, hep C due to use of IV drugs in the past, schizophrenia, depression and anxiety presents to hospital after she had a fall and distal radial ulnar fracture.  1.  Left distal radial ulnar fracture S/p surgery. Pain control.  2.  Seizure disorder-continue Keppra.  3.  Depression and anxiety-again not taking any of her medicines for more  than 7 months. started Celexa as she complains of anxiety episodes. continue celexa 20mg  daily per Dr. He.  4.  Polysubstance abuse-urine tox screen.  Opiate positive  5.  Alcohol abuse disorder. On CIWA. No signs of withdrawal.  6.  Tobacco use disorder.  Smoking cessation was  counseled for 3 to 4 minutes, on nicotine patch  Medically stable. All the records are reviewed and case discussed with Care Management/Social Worker. Management plans discussed with the patient, family and they are in agreement.  CODE STATUS: Full Code  TOTAL TIME TAKING CARE OF THIS PATIENT: 22 minutes.   More than 50% of the time was spent in counseling/coordination of care: YES  POSSIBLE D/C today, DEPENDING ON CLINICAL CONDITION.   Shaune Pollack M.D on 11/24/2017 at 8:43 AM  Between 7am to 6pm - Pager - [phone]  After 6pm go to [email or web address] - Therapist, nutritional Hospitalists

## 2017-11-24 NOTE — Progress Notes (Signed)
Reviewed discharge instructions with the patient. Removed IV and applied dressing.  Patient verbalized understanding.  Prescriptions reviewed. Patient wheeled out via wheelchair to ride.
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
## 2017-11-24 NOTE — Care Management Obs Status (Signed)
MEDICARE OBSERVATION STATUS NOTIFICATION   Patient Details  Name: Sylvia [name] MRN: [id number] Date of Birth: 1985-06-12   Medicare Observation Status Notification Given:  YesExplained to [name] Patient agreed to X per care management    Gwenette Greet, RN 11/24/2017, 12:53 PM

## 2017-11-24 NOTE — Progress Notes (Addendum)
  Subjective:  Postop day #2 status post ORIF of left distal radius fracture and open carpal tunnel release.  Patient reports left wrist pain as moderate.    Objective:   VITALS:   Vitals:   11/23/17 0812 11/23/17 1608 11/23/17 2344 11/24/17 0812  BP: 122/75 112/76 (!) 91/57 (!) 123/93  Pulse: 62 64 78 85  Resp: 18 20 18 20   Temp: 98.4 F (36.9 C) 98.5 F (36.9 C) 98.2 F (36.8 C) 98 F (36.7 C)  TempSrc: Oral Oral Oral Oral  SpO2: 100% 100% 100% 100%  Weight:      Height:        PHYSICAL EXAM: Left forearm: Patient's cast was removed.  Her incision is clean dry and intact.  Her swelling is improved.  There is no erythema and only mild ecchymosis.  A short arm cast was applied at the bedside with my cast tech.  Patient can gently flex and extend her fingers.  Her range of motion is improving.  Patient also has improving sensation to light touch in her index and middle finger.  She still has some paresthesias in her left thumb.  Her ring finger and small finger do not have paresthesias any longer.  Her forearm and hand compartments are soft and compressible.   LABS  No results found for this or any previous visit (from the past 24 hour(s)).  Dg Wrist Complete Left  Result Date: 11/22/2017 CLINICAL DATA:  Postop from ORIF. EXAM: LEFT WRIST - COMPLETE 3+ VIEW COMPARISON:  Preop radiographs, 11/22/2017 at 5:01 a.m. FINDINGS: Distal radial fracture has been reduced with a volar fixation plate and multiple screws. Radial length has been reduced to near anatomic. There is minimal residual displacement. Radial inclination is near anatomic. Ulnar fracture is normally aligned. IMPRESSION: 1. Well aligned comminuted distal radial fracture following ORIF. Electronically Signed   By: Amie Portland M.D.   On: 11/22/2017 19:21    Assessment/Plan: 2 Days Post-Op   Principal Problem:   Radius fracture Active Problems:   Alcohol use disorder, severe, dependence (HCC)  Patient will be  discharged home today.  She will be ordered for home health OT and nursing.  Patient will follow-up in our office within 1 to 2 weeks for reevaluation out of her cast.  Patient should keep her cast on until follow-up.  Patient needs to keep the cast dry and should cover with a plastic bag for showering.  Patient should not weightbear or lift with her left upper extremity.   She should use a sling when she is out of bed.    Juanell Fairly , MD 11/24/2017, 12:36 PM

## 2017-11-24 NOTE — Discharge Summary (Signed)
Physician Discharge Summary  Patient ID: Sylvia [name] MRN: [id number] DOB/AGE: 08-11-1985 32 y.o.  Admit date: 11/22/2017 Discharge date: 11/24/2017  Admission Diagnoses:  left distal radius fracture Radius fracture  Discharge Diagnoses:  left distal radius fracture s/p ORIF and open carpal tunnel release Principal Problem:   Radius fracture Active Problems:   Alcohol use disorder, severe, dependence (HCC)   Past Medical History:  Diagnosis Date  . Alcoholism (HCC)   . CP (cerebral palsy) (HCC)   . Hepatitis C   . Herpes   . Polysubstance abuse (HCC)   . Schizoaffective schizophrenia (HCC) 09/17/2013   Per notes from [facility] as on 2014   . Seizures (HCC)   . Stroke [facility])     Surgeries: Procedure(s): OPEN REDUCTION INTERNAL FIXATION (ORIF) DISTAL RADIAL FRACTURE and carpal tunnel release on 11/22/2017   Consultants (if any): Treatment Team:  Jimmy Footman, MD Shaune Pollack, MD He, Jun, MD Clapacs, Jackquline Denmark, MD  Discharged Condition: Improved  Hospital Course: Sylvia [name] is an 32 y.o. female who was admitted 11/22/2017 with a diagnosis of  left distal radius fracture Radius fracture and went to the operating room on 11/22/2017 and underwent ORIF of the left distal radius and open carpal tunnel release.   She was given perioperative antibiotics:  Anti-infectives (From admission, onward)   Start     Dose/Rate Route Frequency Ordered Stop   11/22/17 2015  ceFAZolin (ANCEF) IVPB 1 g/50 mL premix     1 g 100 mL/hr over 30 Minutes Intravenous Every 6 hours 11/22/17 2003 11/23/17 1022   11/22/17 0941  ceFAZolin (ANCEF) IVPB 2g/100 mL premix     2 g 200 mL/hr over 30 Minutes Intravenous 30 min pre-op 11/22/17 0941 11/22/17 1553    .  She was given sequential compression devices, early ambulation, and teds and SCDs for DVT prophylaxis.  Patient was admitted for postoperative pain control and neurovascular monitoring.  Patient's pain improved during hospitalization.   Patient had her dressing removed and a cast applied on postop day #2.  She was prepared for discharge home.  She benefited maximally from the hospital stay and there were no complications.    Recent vital signs:  Vitals:   11/23/17 2344 11/24/17 0812  BP: (!) 91/57 (!) 123/93  Pulse: 78 85  Resp: 18 20  Temp: 98.2 F (36.8 C) 98 F (36.7 C)  SpO2: 100% 100%    Recent laboratory studies:  Lab Results  Component Value Date   HGB 11.8 (L) 11/22/2017   HGB 13.3 04/19/2017   HGB 12.8 02/03/2017   Lab Results  Component Value Date   WBC 9.0 11/22/2017   PLT 172 11/22/2017   Lab Results  Component Value Date   INR 0.94 11/22/2017   Lab Results  Component Value Date   NA 134 (L) 11/22/2017   K 4.6 11/22/2017   CL 98 11/22/2017   CO2 24 11/22/2017   BUN 7 11/22/2017   CREATININE 0.69 11/22/2017   GLUCOSE 90 11/22/2017    Discharge Medications:   Allergies as of 11/24/2017      Reactions   Diphenhydramine Hcl Rash   Reaction:  Lowers Dilantin level    Fluoxetine Other (See Comments)   Reaction:  Altered mental status    Baclofen Other (See Comments)   Drops blood pressure, per pt   Depakote [divalproex Sodium] Other (See Comments)   Reaction:  Seizures and drops pts BP   Quetiapine Other (See Comments)  Reaction:  Drops pts BP      Medication List    STOP taking these medications   hydrOXYzine 50 MG tablet Commonly known as:  ATARAX/VISTARIL   iloperidone 4 MG Tabs tablet Commonly known as:  FANAPT   methocarbamol 500 MG tablet Commonly known as:  ROBAXIN   ondansetron 4 MG disintegrating tablet Commonly known as:  ZOFRAN-ODT   valACYclovir 1000 MG tablet Commonly known as:  VALTREX     TAKE these medications   benztropine 0.5 MG tablet Commonly known as:  COGENTIN Take 0.5 mg by mouth at bedtime.   citalopram 20 MG tablet Commonly known as:  CELEXA Take 20 mg by mouth daily.   docusate sodium 100 MG capsule Commonly known as:   COLACE Take 1 capsule (100 mg total) by mouth 2 (two) times daily.   GABAPENTIN PO Take 300-400 mg by mouth 3 (three) times daily. Take 300mg  by mouth twice daily and 400mg  by mouth at bedtime   levETIRAcetam 250 MG tablet Commonly known as:  KEPPRA Take 250 mg by mouth at bedtime.   nicotine polacrilex 2 MG gum Commonly known as:  NICORETTE Take 1 each (2 mg total) by mouth as needed for smoking cessation.   oxyCODONE 5 MG immediate release tablet Commonly known as:  Oxy IR/ROXICODONE Take 1 tablet (5 mg total) by mouth every 4 (four) hours as needed for moderate pain or severe pain.   perphenazine 2 MG tablet Commonly known as:  TRILAFON Take 2-6 mg 2 (two) times daily by mouth. Take 1 tablet by mouth every morning and 3 tablets by mouth at bedtime   risperiDONE 2 MG tablet Commonly known as:  RISPERDAL Take 1 tablet (2 mg total) by mouth at bedtime. What changed:    medication strength  how much to take   VITAMIN D (ERGOCALCIFEROL) PO Take 1,000 Units daily by mouth.       Diagnostic Studies: Dg Forearm Left  Result Date: 11/22/2017 CLINICAL DATA:  Post fall with left arm pain and deformity. EXAM: LEFT FOREARM - 2 VIEW COMPARISON:  None. FINDINGS: Displaced distal radial metaphyseal fracture with 3/4 shaft width dorsal displacement and mild apex volar angulation. Fracture involves the distal radioulnar joint. Possible but not definitive radiocarpal involvement. There is a minimally displaced ulna styloid fracture. Additional nondisplaced incomplete fracture of the distal ulnar shaft. Proximal radius and ulna are intact. Diffuse soft tissue edema about the wrist. IMPRESSION: 1. Distal radial metaphyseal fracture with significant displacement, mild apex volar angulation and radiocarpal joint involvement. 2. Minimally displaced ulna styloid fracture. Additional nondisplaced distal ulnar shaft fracture. Electronically Signed   By: Narda Rutherford M.D.   On: 11/22/2017 05:18    Dg Wrist Complete Left  Result Date: 11/22/2017 CLINICAL DATA:  Postop from ORIF. EXAM: LEFT WRIST - COMPLETE 3+ VIEW COMPARISON:  Preop radiographs, 11/22/2017 at 5:01 a.m. FINDINGS: Distal radial fracture has been reduced with a volar fixation plate and multiple screws. Radial length has been reduced to near anatomic. There is minimal residual displacement. Radial inclination is near anatomic. Ulnar fracture is normally aligned. IMPRESSION: 1. Well aligned comminuted distal radial fracture following ORIF. Electronically Signed   By: Amie Portland M.D.   On: 11/22/2017 19:21   Dg Humerus Left  Result Date: 11/22/2017 CLINICAL DATA:  Post fall with left arm pain and deformity. EXAM: LEFT HUMERUS - 2+ VIEW COMPARISON:  None. FINDINGS: Cortical margins of the humerus are intact. No acute fracture. Elbow and shoulder alignment are  maintained. No focal soft tissue abnormality. Nexplanon visualized in the mid arm. IMPRESSION: Negative radiograph the left humerus. Electronically Signed   By: Narda Rutherford M.D.   On: 11/22/2017 05:19    Disposition: Discharge disposition: 01-Home or Self Care       Discharge Instructions    Call MD / Call 911   Complete by:  As directed    If you experience chest pain or shortness of breath, CALL 911 and be transported to the hospital emergency room.  If you develope a fever above 101 F, pus (white drainage) or increased drainage or redness at the wound, or calf pain, call your surgeon's office.   Constipation Prevention   Complete by:  As directed    Drink plenty of fluids.  Prune juice may be helpful.  You may use a stool softener, such as Colace (over the counter) 100 mg twice a day.  Use MiraLax (over the counter) for constipation as needed.   Diet general   Complete by:  As directed    Discharge instructions   Complete by:  As directed    Patient will be discharged home today.  She will be ordered for home health OT and nursing.  Patient will  follow-up in our office within 1 to 2 weeks for reevaluation out of her cast.  Patient should keep her cast on until follow-up.  Patient needs to keep the cast dry and should cover with a plastic bag for showering.  Patient should not weightbear or lift with her left upper extremity.   She should use a sling when she is out of bed.   Driving restrictions   Complete by:  As directed    No driving until follow up   Increase activity slowly as tolerated   Complete by:  As directed    Lifting restrictions   Complete by:  As directed    No lifting for 8-12 weeks         Signed: Juanell Fairly ,MD 11/24/2017, 12:50 PM

## 2017-11-24 NOTE — Progress Notes (Signed)
OT Cancellation Note  Patient Details Name: Sylvia [name] MRN: [id number] DOB: December 05, 1985   Cancelled Treatment:    Reason Eval/Treat Not Completed: Pain limiting ability to participate. Order received, chart reviewed. Pt tearful, noting 10/10 pain in LUE. RN notified. Will re-attempt OT evaluation at later date/time as pt is able to participate and is medically appropriate.   Richrd Prime, MPH, MS, OTR/L ascom [phone] 11/24/17, 9:16 AM

## 2017-12-02 ENCOUNTER — Emergency Department: Payer: Medicare Other

## 2017-12-02 ENCOUNTER — Inpatient Hospital Stay
Admission: EM | Admit: 2017-12-02 | Discharge: 2017-12-05 | DRG: 908 | Disposition: A | Discharge status: Other Acute Inpatient Hospital | Payer: Medicare Other | Attending: Internal Medicine | Admitting: Internal Medicine

## 2017-12-02 DIAGNOSIS — Z888 Allergy status to other drugs, medicaments and biological substances status: Secondary | ICD-10-CM | POA: Diagnosis not present

## 2017-12-02 DIAGNOSIS — Z881 Allergy status to other antibiotic agents status: Secondary | ICD-10-CM | POA: Diagnosis not present

## 2017-12-02 DIAGNOSIS — F10229 Alcohol dependence with intoxication, unspecified: Secondary | ICD-10-CM | POA: Diagnosis present

## 2017-12-02 DIAGNOSIS — F79 Unspecified intellectual disabilities: Secondary | ICD-10-CM | POA: Diagnosis present

## 2017-12-02 DIAGNOSIS — Z7151 Drug abuse counseling and surveillance of drug abuser: Secondary | ICD-10-CM

## 2017-12-02 DIAGNOSIS — G3184 Mild cognitive impairment, so stated: Secondary | ICD-10-CM | POA: Diagnosis present

## 2017-12-02 DIAGNOSIS — F1020 Alcohol dependence, uncomplicated: Secondary | ICD-10-CM | POA: Diagnosis present

## 2017-12-02 DIAGNOSIS — F1910 Other psychoactive substance abuse, uncomplicated: Secondary | ICD-10-CM | POA: Diagnosis present

## 2017-12-02 DIAGNOSIS — Z818 Family history of other mental and behavioral disorders: Secondary | ICD-10-CM | POA: Diagnosis not present

## 2017-12-02 DIAGNOSIS — R451 Restlessness and agitation: Secondary | ICD-10-CM | POA: Diagnosis present

## 2017-12-02 DIAGNOSIS — G40909 Epilepsy, unspecified, not intractable, without status epilepticus: Secondary | ICD-10-CM | POA: Diagnosis present

## 2017-12-02 DIAGNOSIS — Y903 Blood alcohol level of 60-79 mg/100 ml: Secondary | ICD-10-CM | POA: Diagnosis present

## 2017-12-02 DIAGNOSIS — Z89421 Acquired absence of other right toe(s): Secondary | ICD-10-CM | POA: Diagnosis not present

## 2017-12-02 DIAGNOSIS — T50902A Poisoning by unspecified drugs, medicaments and biological substances, intentional self-harm, initial encounter: Secondary | ICD-10-CM | POA: Diagnosis not present

## 2017-12-02 DIAGNOSIS — Z883 Allergy status to other anti-infective agents status: Secondary | ICD-10-CM | POA: Diagnosis not present

## 2017-12-02 DIAGNOSIS — S62109A Fracture of unspecified carpal bone, unspecified wrist, initial encounter for closed fracture: Secondary | ICD-10-CM

## 2017-12-02 DIAGNOSIS — F259 Schizoaffective disorder, unspecified: Secondary | ICD-10-CM | POA: Diagnosis present

## 2017-12-02 DIAGNOSIS — Z79899 Other long term (current) drug therapy: Secondary | ICD-10-CM | POA: Diagnosis not present

## 2017-12-02 DIAGNOSIS — F419 Anxiety disorder, unspecified: Secondary | ICD-10-CM | POA: Diagnosis present

## 2017-12-02 DIAGNOSIS — B009 Herpesviral infection, unspecified: Secondary | ICD-10-CM | POA: Diagnosis present

## 2017-12-02 DIAGNOSIS — T50901A Poisoning by unspecified drugs, medicaments and biological substances, accidental (unintentional), initial encounter: Secondary | ICD-10-CM

## 2017-12-02 DIAGNOSIS — R45851 Suicidal ideations: Secondary | ICD-10-CM | POA: Diagnosis present

## 2017-12-02 DIAGNOSIS — S52602S Unspecified fracture of lower end of left ulna, sequela: Secondary | ICD-10-CM | POA: Diagnosis not present

## 2017-12-02 DIAGNOSIS — F319 Bipolar disorder, unspecified: Secondary | ICD-10-CM | POA: Diagnosis present

## 2017-12-02 DIAGNOSIS — F17210 Nicotine dependence, cigarettes, uncomplicated: Secondary | ICD-10-CM | POA: Diagnosis present

## 2017-12-02 DIAGNOSIS — G809 Cerebral palsy, unspecified: Secondary | ICD-10-CM | POA: Diagnosis present

## 2017-12-02 DIAGNOSIS — A419 Sepsis, unspecified organism: Secondary | ICD-10-CM

## 2017-12-02 DIAGNOSIS — R4183 Borderline intellectual functioning: Secondary | ICD-10-CM

## 2017-12-02 DIAGNOSIS — Z8673 Personal history of transient ischemic attack (TIA), and cerebral infarction without residual deficits: Secondary | ICD-10-CM | POA: Diagnosis not present

## 2017-12-02 DIAGNOSIS — R6510 Systemic inflammatory response syndrome (SIRS) of non-infectious origin without acute organ dysfunction: Secondary | ICD-10-CM | POA: Diagnosis present

## 2017-12-02 DIAGNOSIS — B1920 Unspecified viral hepatitis C without hepatic coma: Secondary | ICD-10-CM | POA: Diagnosis present

## 2017-12-02 DIAGNOSIS — T50904A Poisoning by unspecified drugs, medicaments and biological substances, undetermined, initial encounter: Secondary | ICD-10-CM | POA: Diagnosis present

## 2017-12-02 DIAGNOSIS — L03114 Cellulitis of left upper limb: Secondary | ICD-10-CM

## 2017-12-02 LAB — COMPREHENSIVE METABOLIC PANEL
ALK PHOS: 65 U/L (ref 38–126)
ALT: 21 U/L (ref 0–44)
AST: 34 U/L (ref 15–41)
Albumin: 3.9 g/dL (ref 3.5–5.0)
Anion gap: 11 (ref 5–15)
BUN: 5 mg/dL — AB (ref 6–20)
CALCIUM: 8.5 mg/dL — AB (ref 8.9–10.3)
CO2: 22 mmol/L (ref 22–32)
CREATININE: 0.51 mg/dL (ref 0.44–1.00)
Chloride: 106 mmol/L (ref 98–111)
GFR calc non Af Amer: >60 mL/min (ref >60)
Glucose, Bld: 95 mg/dL (ref 70–99)
Potassium: 3.9 mmol/L (ref 3.5–5.1)
Sodium: 139 mmol/L (ref 135–145)
Total Bilirubin: 0.3 mg/dL (ref 0.3–1.2)
Total Protein: 7.2 g/dL (ref 6.5–8.1)

## 2017-12-02 LAB — CBC
HCT: 36 % (ref 36.0–46.0)
HEMOGLOBIN: 11.9 g/dL — AB (ref 12.0–15.0)
MCH: 32.7 pg (ref 26.0–34.0)
MCHC: 33.1 g/dL (ref 30.0–36.0)
MCV: 98.9 fL (ref 80.0–100.0)
NRBC: 0 % (ref 0.0–0.2)
Platelets: 222 10*3/uL (ref 150–400)
RBC: 3.64 MIL/uL — AB (ref 3.87–5.11)
RDW: 13.1 % (ref 11.5–15.5)
WBC: 3.7 10*3/uL — ABNORMAL LOW (ref 4.0–10.5)

## 2017-12-02 LAB — LACTIC ACID, PLASMA: Lactic Acid, Venous: 2.4 mmol/L (ref 0.5–1.9)

## 2017-12-02 LAB — SEDIMENTATION RATE: SED RATE: 32 mm/h — AB (ref 0–20)

## 2017-12-02 LAB — ETHANOL: Alcohol, Ethyl (B): 67 mg/dL — ABNORMAL HIGH (ref <10)

## 2017-12-02 LAB — SALICYLATE LEVEL: Salicylate Lvl: <7 mg/dL (ref 2.8–30.0)

## 2017-12-02 LAB — MAGNESIUM: MAGNESIUM: 2.3 mg/dL (ref 1.7–2.4)

## 2017-12-02 LAB — ACETAMINOPHEN LEVEL: Acetaminophen (Tylenol), Serum: <10 ug/mL — ABNORMAL LOW (ref 10–30)

## 2017-12-02 MED ORDER — ENOXAPARIN SODIUM 40 MG/0.4ML ~~LOC~~ SOLN
40.0000 mg | SUBCUTANEOUS | Status: DC
  Administered 2017-12-03: 40 mg via SUBCUTANEOUS
  Filled 2017-12-02: qty 0.4

## 2017-12-02 MED ORDER — LORAZEPAM 2 MG/ML IJ SOLN: 1.0000 mg | Freq: Four times a day (QID) | INTRAMUSCULAR | Status: AC | PRN

## 2017-12-02 MED ORDER — BENZTROPINE MESYLATE 0.5 MG PO TABS
0.5000 mg | ORAL_TABLET | Freq: Every day | ORAL | Status: DC
  Administered 2017-12-03 – 2017-12-04 (×2): 0.5 mg via ORAL
  Filled 2017-12-02 (×4): qty 1

## 2017-12-02 MED ORDER — NICOTINE 21 MG/24HR TD PT24
21.0000 mg | MEDICATED_PATCH | Freq: Every day | TRANSDERMAL | Status: DC
  Administered 2017-12-03 – 2017-12-05 (×3): 21 mg via TRANSDERMAL
  Filled 2017-12-02 (×3): qty 1

## 2017-12-02 MED ORDER — ONDANSETRON HCL 4 MG PO TABS: 4.0000 mg | ORAL_TABLET | Freq: Four times a day (QID) | ORAL | Status: DC | PRN

## 2017-12-02 MED ORDER — FOLIC ACID 1 MG PO TABS
1.0000 mg | ORAL_TABLET | Freq: Every day | ORAL | Status: DC
  Administered 2017-12-03 – 2017-12-05 (×2): 1 mg via ORAL
  Filled 2017-12-02 (×2): qty 1

## 2017-12-02 MED ORDER — ACETAMINOPHEN 650 MG RE SUPP: 650.0000 mg | Freq: Four times a day (QID) | RECTAL | Status: DC | PRN

## 2017-12-02 MED ORDER — RISPERIDONE 3 MG PO TABS
3.0000 mg | ORAL_TABLET | Freq: Two times a day (BID) | ORAL | Status: DC
  Filled 2017-12-02 (×2): qty 1

## 2017-12-02 MED ORDER — ARIPIPRAZOLE 5 MG PO TABS
5.0000 mg | ORAL_TABLET | Freq: Every day | ORAL | Status: DC
  Filled 2017-12-02 (×2): qty 1

## 2017-12-02 MED ORDER — DOCUSATE SODIUM 100 MG PO CAPS
100.0000 mg | ORAL_CAPSULE | Freq: Two times a day (BID) | ORAL | Status: DC
  Administered 2017-12-03: 100 mg via ORAL
  Filled 2017-12-02 (×3): qty 1

## 2017-12-02 MED ORDER — ONDANSETRON HCL 4 MG/2ML IJ SOLN: 4.0000 mg | Freq: Four times a day (QID) | INTRAMUSCULAR | Status: DC | PRN

## 2017-12-02 MED ORDER — ADULT MULTIVITAMIN W/MINERALS CH
1.0000 | ORAL_TABLET | Freq: Every day | ORAL | Status: DC
  Administered 2017-12-03 – 2017-12-05 (×2): 1 via ORAL
  Filled 2017-12-02 (×2): qty 1

## 2017-12-02 MED ORDER — PERPHENAZINE 4 MG PO TABS
2.0000 mg | ORAL_TABLET | Freq: Every morning | ORAL | Status: DC
  Filled 2017-12-02: qty 1

## 2017-12-02 MED ORDER — OXYCODONE HCL 5 MG PO TABS
5.0000 mg | ORAL_TABLET | ORAL | Status: DC | PRN
  Administered 2017-12-02 – 2017-12-04 (×7): 5 mg via ORAL
  Filled 2017-12-02 (×7): qty 1

## 2017-12-02 MED ORDER — CLINDAMYCIN PHOSPHATE 600 MG/50ML IV SOLN
600.0000 mg | Freq: Once | INTRAVENOUS | Status: AC
  Administered 2017-12-02: 600 mg via INTRAVENOUS
  Filled 2017-12-02: qty 50

## 2017-12-02 MED ORDER — LORAZEPAM 2 MG/ML IJ SOLN
1.0000 mg | Freq: Once | INTRAMUSCULAR | Status: AC
  Administered 2017-12-02: 1 mg via INTRAVENOUS
  Filled 2017-12-02: qty 1

## 2017-12-02 MED ORDER — FLUOXETINE HCL 20 MG PO CAPS
20.0000 mg | ORAL_CAPSULE | Freq: Every day | ORAL | Status: DC
  Filled 2017-12-02 (×2): qty 1

## 2017-12-02 MED ORDER — THIAMINE HCL 100 MG/ML IJ SOLN
100.0000 mg | Freq: Every day | INTRAMUSCULAR | Status: DC
  Administered 2017-12-04: 100 mg via INTRAVENOUS
  Filled 2017-12-02: qty 2

## 2017-12-02 MED ORDER — PERPHENAZINE 2 MG PO TABS
6.0000 mg | ORAL_TABLET | Freq: Every day | ORAL | Status: DC
  Filled 2017-12-02: qty 3

## 2017-12-02 MED ORDER — VITAMIN D 1000 UNITS PO TABS
1000.0000 [IU] | ORAL_TABLET | Freq: Every day | ORAL | Status: DC
  Administered 2017-12-03 – 2017-12-05 (×2): 1000 [IU] via ORAL
  Filled 2017-12-02 (×2): qty 1

## 2017-12-02 MED ORDER — CITALOPRAM HYDROBROMIDE 20 MG PO TABS: 20.0000 mg | ORAL_TABLET | Freq: Every day | ORAL | Status: DC

## 2017-12-02 MED ORDER — HYDROXYZINE HCL 50 MG PO TABS
50.0000 mg | ORAL_TABLET | Freq: Three times a day (TID) | ORAL | Status: DC
  Administered 2017-12-03 – 2017-12-05 (×5): 50 mg via ORAL
  Filled 2017-12-02 (×11): qty 1

## 2017-12-02 MED ORDER — VITAMIN B-1 100 MG PO TABS
100.0000 mg | ORAL_TABLET | Freq: Every day | ORAL | Status: DC
  Administered 2017-12-03 – 2017-12-05 (×2): 100 mg via ORAL
  Filled 2017-12-02 (×2): qty 1

## 2017-12-02 MED ORDER — LORAZEPAM 2 MG PO TABS
0.0000 mg | ORAL_TABLET | Freq: Two times a day (BID) | ORAL | Status: DC
  Administered 2017-12-05: 1 mg via ORAL
  Administered 2017-12-05: 2 mg via ORAL
  Filled 2017-12-02 (×2): qty 1

## 2017-12-02 MED ORDER — GABAPENTIN 300 MG PO CAPS
300.0000 mg | ORAL_CAPSULE | Freq: Two times a day (BID) | ORAL | Status: DC
  Administered 2017-12-03 – 2017-12-05 (×3): 300 mg via ORAL
  Filled 2017-12-02 (×3): qty 1

## 2017-12-02 MED ORDER — SODIUM CHLORIDE 0.9 % IV BOLUS
1000.0000 mL | Freq: Once | INTRAVENOUS | Status: AC
  Administered 2017-12-02: 1000 mL via INTRAVENOUS

## 2017-12-02 MED ORDER — PERPHENAZINE 4 MG PO TABS
6.0000 mg | ORAL_TABLET | Freq: Every day | ORAL | Status: DC
  Filled 2017-12-02: qty 2

## 2017-12-02 MED ORDER — LORAZEPAM 1 MG PO TABS
1.0000 mg | ORAL_TABLET | Freq: Four times a day (QID) | ORAL | Status: AC | PRN
  Administered 2017-12-03 – 2017-12-05 (×5): 1 mg via ORAL
  Filled 2017-12-02 (×10): qty 1

## 2017-12-02 MED ORDER — LORAZEPAM 2 MG PO TABS
0.0000 mg | ORAL_TABLET | Freq: Four times a day (QID) | ORAL | Status: AC
  Administered 2017-12-02 – 2017-12-04 (×4): 1 mg via ORAL
  Filled 2017-12-02: qty 1

## 2017-12-02 MED ORDER — ACETAMINOPHEN 325 MG PO TABS: 650.0000 mg | ORAL_TABLET | Freq: Four times a day (QID) | ORAL | Status: DC | PRN

## 2017-12-02 MED ORDER — SODIUM CHLORIDE 0.9 % IV SOLN
INTRAVENOUS | Status: DC
  Administered 2017-12-02 – 2017-12-03 (×2): via INTRAVENOUS

## 2017-12-02 MED ORDER — LEVETIRACETAM 250 MG PO TABS
250.0000 mg | ORAL_TABLET | Freq: Every day | ORAL | Status: DC
  Filled 2017-12-02 (×2): qty 1

## 2017-12-02 NOTE — Consult Note (Signed)
[facility] Consult   Reason for Consult: Consult for this 32 year old woman with a history of alcohol abuse and cognitive impairment who comes into the hospital with overdose Referring Physician: Mariea Clonts Patient Identification: Sylvia [name] MRN:  [id number] Principal Diagnosis: Overdose Diagnosis:   Patient Active Problem List   Diagnosis Date Noted  . SIRS (systemic inflammatory response syndrome) ([place]) [R65.10] 12/02/2017  . Overdose [T50.901A] 12/02/2017  . Radius fracture [S52.90XA] 11/22/2017  . Substance induced mood disorder ([place]) [F19.94] 07/24/2016  . 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 Cocaine use disorder, moderate, dependence ([place]) [F14.20] 03/17/2015  . Cannabis use disorder, mild, abuse [F12.10] 03/17/2015  . Alcohol use disorder, severe, dependence ([place]) [F10.20] 03/17/2015  . Seizure disorder ([place]) [Z32.992] 09/17/2013  . Cellulitis and abscess of toe of right foot [L03.031, L02.611] 09/16/2013  . Hepatitis C [B19.20]   . CP (cerebral palsy) ([place]) [G80.9]     Total Time spent with patient: 1 hour  Subjective:   Sylvia [name] is a 32 y.o. female patient admitted with patient not able to give history.  HPI: Patient seen chart reviewed.  32 year old woman with a history of alcohol abuse cerebral palsy intellectual disability possible schizophrenia who comes into the hospital because she took a large overdose of multiple medicines.  Family member reports that the patient had been intoxicated and took a full 7-day supply of all of her medications at once last night and made suicidal statements.  Additionally the patient removed a cast that had recently been applied to her broken arm.  Patient was very lethargic and withdrawn in the emergency room.  Opened her eyes only slightly.  Could not answer any questions lucidly.  Social history: Patient lives with an aunt who is her closest relative and might be her guardian.  Medical history:  Overdose today.  History of hepatitis C history of cerebral palsy recent fracture of her arm  Substance abuse history: Long-standing alcohol abuse with heavy complications although no history of DTs she does appear to have had alcohol withdrawal seizures.  Past Psychiatric History: Patient has been seen many times psychiatrically over the years.  Years ago the diagnosis of schizophrenia was put forward although the patient in more recent times has not shown symptoms of schizophrenia so much as intellectual disability and chronic heavy alcohol abuse with cognitive impairment.  No identified prior suicide attempts.  Risk to Self:   Risk to Others:   Prior Inpatient Therapy:   Prior Outpatient Therapy:    Past Medical History:  Past Medical History:  Diagnosis Date  . Alcoholism ([place])   . CP (cerebral palsy) ([place])   . Hepatitis C   . Herpes   . Polysubstance abuse ([place])   . Schizoaffective schizophrenia ([place]) 09/17/2013   Per notes from [facility] as on 2014   . Seizures ([place])   . Stroke [facility])     Past Surgical History:  Procedure Laterality Date  . 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 FOOT SURGERY     rt and left  . LEG SURGERY Right    x7  . OPEN REDUCTION INTERNAL FIXATION (ORIF) DISTAL RADIAL FRACTURE Left 11/22/2017   Procedure: OPEN REDUCTION INTERNAL FIXATION (ORIF) DISTAL RADIAL FRACTURE and carpal tunnel release;  Surgeon: Thornton Park, MD;  Location: [facility] ORS;  Service: Orthopedics;  Laterality: Left;   Family History:  Family History  Problem Relation Age of Onset  . Bipolar disorder Mother   . Cancer Maternal Grandmother   . Diabetes Maternal Grandfather   . Hypertension Maternal Grandfather    Family Psychiatric  History: None known Social History:  Social History   Substance and Sexual Activity  Alcohol Use Yes  .  Alcohol/week: 3.0 standard drinks  . Types: 3 Cans of beer per week   Comment: Per patient 0.5 gallon to gallon when she can but since being in group home she can't do it as often      Social History   Substance and Sexual Activity  Drug Use Yes  . Types: "Crack" cocaine, Marijuana, Cocaine   Comment: Per patient uses herion and crack and cocaine "once in a blue moon"    Social History   Socioeconomic History  . Marital status: Single    Spouse name: Not on file  . Number of children: Not on file  . Years of education: Not on file  . Highest education level: Not on file  Occupational History  . Not on file  Social Needs  . Financial resource strain: Not on file  . Food insecurity:    Worry: Not on file    Inability: Not on file  . Transportation needs:    Medical: Not on file    Non-medical: Not on file  Tobacco Use  . Smoking status: Current Every Day Smoker    Packs/day: 1.00    Years: 20.00    Pack years: 20.00    Types: Cigarettes  . Smokeless tobacco: Former User  Substance and Sexual Activity  . Alcohol use: Yes    Alcohol/week: 3.0 standard drinks    Types: 3 Cans of beer per week    Comment: Per patient 0.5 gallon to gallon when she can but since being in group home she can't do it as often   . Drug use: Yes    Types: "Crack" cocaine, Marijuana, Cocaine    Comment: Per patient uses herion and crack and cocaine "once in a blue moon"  . Sexual activity: Not Currently    Birth control/protection: Implant  Lifestyle  . Physical activity:    Days per week: Not on file    Minutes per session: Not on file  . Stress: Not on file  Relationships  . Social connections:    Talks on phone: Not on file    Gets together: Not on file    Attends religious service: Not on file    Active member of club or organization: Not on file    Attends meetings of clubs or organizations: Not on file    Relationship status: Not on file  Other Topics Concern  . Not on file   Social History Narrative   Lives at home by herself, only has acquaintances here   Does not work    Additional Social History:    Allergies:   Allergies  Allergen Reactions  . Diphenhydramine Hcl Rash    Reaction:  Lowers Dilantin level   . Fluoxetine Other (See Comments)    Reaction:  Altered mental status   . Baclofen Other (See Comments)    Drops blood pressure, per pt  . Depakote [Divalproex Sodium] Other (See Comments)    Reaction:  Seizures and drops pts BP  . Quetiapine Other (See Comments)    Reaction:  Drops pts BP    Labs:  Results for orders placed or performed during the hospital encounter of   12/02/17 (from the past 48 hour(s))  Lactic acid, plasma     Status: Abnormal   Collection Time: 12/02/17  2:20 PM  Result Value Ref Range   Lactic Acid, Venous 2.4 (HH) 0.5 - 1.9 mmol/L    Comment: CRITICAL RESULT CALLED TO, READ BACK BY AND VERIFIED WITH KELLY PENDLETON 12/02/17 1458 KLW Performed at [facility], [address] [place], [place] [address]   CBC     Status: Abnormal   Collection Time: 12/02/17  2:20 PM  Result Value Ref Range   WBC 3.7 (L) 4.0 - 10.5 K/uL   RBC 3.64 (L) 3.87 - 5.11 MIL/uL   Hemoglobin 11.9 (L) 12.0 - 15.0 g/dL   HCT 36.0 36.0 - 46.0 %   MCV 98.9 80.0 - 100.0 fL   MCH 32.7 26.0 - 34.0 pg   MCHC 33.1 30.0 - 36.0 g/dL   RDW 13.1 11.5 - 15.5 %   Platelets 222 150 - 400 K/uL   nRBC 0.0 0.0 - 0.2 %    Comment: Performed at [facility], [address] [place], [place] [address]  Comprehensive metabolic panel     Status: Abnormal   Collection Time: 12/02/17  2:20 PM  Result Value Ref Range   Sodium 139 135 - 145 mmol/L   Potassium 3.9 3.5 - 5.1 mmol/L   Chloride 106 98 - 111 mmol/L   CO2 22 22 - 32 mmol/L   Glucose, Bld 95 70 - 99 mg/dL   BUN 5 (L) 6 - 20 mg/dL   Creatinine, Ser 0.51 0.44 - 1.00 mg/dL   Calcium 8.5 (L) 8.9 - 10.3 mg/dL   Total Protein 7.2 6.5 - 8.1 g/dL   Albumin 3.9 3.5 - 5.0 g/dL   AST 34  15 - 41 U/L   ALT 21 0 - 44 U/L   Alkaline Phosphatase 65 38 - 126 U/L   Total Bilirubin 0.3 0.3 - 1.2 mg/dL   GFR calc non Af Amer >60 >60 mL/min   GFR calc Af Amer >60 >60 mL/min    Comment: (NOTE) The eGFR has been calculated using the CKD EPI equation. This calculation has not been validated in all clinical situations. eGFR's persistently <60 mL/min signify possible Chronic Kidney Disease.    Anion gap 11 5 - 15    Comment: Performed at [facility], [address] [place], [place] [address]  Ethanol     Status: Abnormal   Collection Time: 12/02/17  2:20 PM  Result Value Ref Range   Alcohol, Ethyl (B) 67 (H) <10 mg/dL    Comment: (NOTE) Lowest detectable limit for serum alcohol is 10 mg/dL. For medical purposes only. Performed at [facility], [address] [place], [place] [address]   Sedimentation rate     Status: Abnormal   Collection Time: 12/02/17  2:20 PM  Result Value Ref Range   Sed Rate 32 (H) 0 - 20 mm/hr    Comment: Performed at [facility], [address] [place], [place] [address]  Acetaminophen level     Status: Abnormal   Collection Time: 12/02/17  2:20 PM  Result Value Ref Range   Acetaminophen (Tylenol), Serum <10 (L) 10 - 30 ug/mL    Comment: (NOTE) Therapeutic concentrations vary significantly. A range of 10-30 ug/mL  may be an effective concentration for many patients. However, some  are best treated at concentrations outside of this range. Acetaminophen concentrations >150 ug/mL at 4 hours after ingestion  and >50 ug/mL at 12 hours after ingestion are often associated with  toxic reactions. Performed at [facility], [address] [place], [place] [address]   Salicylate level     Status: None   Collection Time: 12/02/17  2:20 PM  Result Value Ref Range   Salicylate Lvl <9.5 2.8 - 30.0 mg/dL    Comment: Performed at [facility], [address] [place], [place] [address]  Magnesium      Status: None   Collection Time: 12/02/17  2:20 PM  Result Value Ref Range   Magnesium 2.3 1.7 - 2.4 mg/dL    Comment: Performed at [facility], [address] [place], [place] [address]    Current Facility-Administered Medications  Medication Dose Route Frequency Provider Last Rate Last Dose  . folic acid (FOLVITE) tablet 1 mg  1 mg Oral Daily Pyreddy, Pavan, MD      . LORazepam (ATIVAN) tablet 1 mg  1 mg Oral Q6H PRN Saundra Shelling, MD       Or  . LORazepam (ATIVAN) injection 1 mg  1 mg Intravenous Q6H PRN Pyreddy, Pavan, MD      . LORazepam (ATIVAN) tablet 0-4 mg  0-4 mg Oral Q6H Pyreddy, Reatha Harps, MD       Followed by  . [START ON 12/04/2017] LORazepam (ATIVAN) tablet 0-4 mg  0-4 mg Oral Q12H Pyreddy, Pavan, MD      . multivitamin with minerals tablet 1 tablet  1 tablet Oral Daily Pyreddy, Pavan, MD      . nicotine (NICODERM CQ - dosed in mg/24 hours) patch 21 mg  21 mg Transdermal Daily Pyreddy, Pavan, MD      . thiamine (VITAMIN B-1) tablet 100 mg  100 mg Oral Daily Pyreddy, Pavan, MD       Or  . thiamine (B-1) injection 100 mg  100 mg Intravenous Daily Pyreddy, Reatha Harps, MD       Current Outpatient Medications  Medication Sig Dispense Refill  . ARIPiprazole (ABILIFY) 5 MG tablet Take 5 mg by mouth daily.    . cholecalciferol (VITAMIN D) 1000 units tablet Take 1,000 Units by mouth daily.    Marland Kitchen FLUoxetine (PROZAC) 20 MG capsule Take 20 mg by mouth daily.    Marland Kitchen gabapentin (NEURONTIN) 300 MG capsule Take 300 mg by mouth 2 (two) times daily.    . hydrOXYzine (ATARAX/VISTARIL) 50 MG tablet Take 50 mg by mouth 3 (three) times daily.    Marland Kitchen levETIRAcetam (KEPPRA) 250 MG tablet Take 250 mg by mouth at bedtime.    . risperiDONE (RISPERDAL) 3 MG tablet Take 3 mg by mouth 2 (two) times daily.    . benztropine (COGENTIN) 0.5 MG tablet Take 0.5 mg by mouth at bedtime.    . citalopram (CELEXA) 20 MG tablet Take 20 mg by mouth daily.    Marland Kitchen docusate sodium (COLACE) 100 MG capsule Take 1  capsule (100 mg total) by mouth 2 (two) times daily. 10 capsule 0  . nicotine polacrilex (NICORETTE) 2 MG gum Take 1 each (2 mg total) by mouth as needed for smoking cessation. (Patient not taking: Reported on 07/06/2015) 100 tablet 0  . oxyCODONE (OXY IR/ROXICODONE) 5 MG immediate release tablet Take 1 tablet (5 mg total) by mouth every 4 (four) hours as needed for moderate pain or severe pain. 30 tablet 0  . perphenazine (TRILAFON) 2 MG tablet Take 2-6 mg 2 (two) times daily by mouth. Take 1 tablet by mouth every morning and  3 tablets by mouth at bedtime      Musculoskeletal: Strength & Muscle Tone: decreased Gait & Station: unable to stand Patient leans: N/A  Psychiatric Specialty Exam: Physical Exam  Nursing note and vitals reviewed. Constitutional: She appears well-developed and well-nourished.  HENT:  Head: Normocephalic and atraumatic.  Eyes: Pupils are equal, round, and reactive to light. Conjunctivae are normal.  Neck: Normal range of motion.  Cardiovascular: Normal heart sounds.  Respiratory: Effort normal. No respiratory distress.  GI: Soft.  Musculoskeletal: Normal range of motion.  Neurological: She is alert.  Skin: Skin is warm and dry.  Psychiatric: Her affect is blunt. She is noncommunicative.    Review of Systems  Unable to perform ROS: Patient unresponsive  Constitutional: Negative.   HENT: Negative.   Eyes: Negative.   Respiratory: Negative.   Cardiovascular: Negative.   Gastrointestinal: Negative.   Musculoskeletal: Negative.   Skin: Negative.   Neurological: Negative.     Blood pressure 102/67, pulse 87, resp. rate 17, SpO2 99 %.There is no height or weight on file to calculate BMI.  General Appearance: Disheveled  Eye Contact:  None  Speech:  Slow and Slurred  Volume:  Decreased  Mood:  Dysphoric  Affect:  Congruent  Thought Process:  Disorganized  Orientation:  Negative  Thought Content:  Negative  Suicidal Thoughts:  Yes.  with intent/plan   Homicidal Thoughts:  No  Memory:  Immediate;   Fair Recent;   Poor Remote;   Poor  Judgement:  Impaired  Insight:  Lacking  Psychomotor Activity:  Decreased  Concentration:  Concentration: Poor  Recall:  Poor  Fund of Knowledge:  Poor  Language:  Poor  Akathisia:  Negative  Handed:  Right  AIMS (if indicated):     Assets:  Housing Social Support  ADL's:  Impaired  Cognition:  Impaired,  Moderate  Sleep:        Treatment Plan Summary: Daily contact with patient to assess and evaluate symptoms and progress in treatment, Medication management and Plan Patient came in with an overdose of multiple medicines.  She will probably be admitted to the medical service.  I am not going to restart any psychiatric medicine except that she will need to have an alcohol withdrawal protocol in place to prevent complicated alcohol withdrawal.  Continue IV C.  I will continue to follow up as she wakes up.  Disposition: Recommend psychiatric Inpatient admission when medically cleared. Supportive therapy provided about ongoing stressors.  John Clapacs, MD 12/02/2017 5:04 PM 

## 2017-12-02 NOTE — H&P (Addendum)
[facility] - [place] at [facility]   PATIENT NAME: Sylvia [name]    MR#:  [id number]  DATE OF BIRTH:  1986/01/26  DATE OF ADMISSION:  12/02/2017  PRIMARY CARE PHYSICIAN: Jeanice Lim, PA-C   REQUESTING/REFERRING PHYSICIAN:   CHIEF COMPLAINT:   Chief Complaint  Patient presents with  . Psychiatric Evaluation    HISTORY OF PRESENT ILLNESS: Sylvia [name]  is a 32 y.o. female with a known history of cerebral palsy, schizoaffective schizophrenia, substance abuse, seizures, CVA in the past, alcohol abuse currently in the emergency room for overdose with multiple medications.  Patient took a large dose of multiple meds.  According to family member patient has been intoxicated and took a full 7-day supply followed medications last night and made suicidal statements.  Patient has been involuntarily committed by ER physician.  Patient also removed the cast that had recently been applied to her broken arm.  Patient has been given sedative medication in the emergency room she has been lethargic and sleepy and  barely opens eyes for verbal commands and unable to give any history.  PAST MEDICAL HISTORY:   Past Medical History:  Diagnosis Date  . Alcoholism (HCC)   . CP (cerebral palsy) (HCC)   . Hepatitis C   . Herpes   . Polysubstance abuse (HCC)   . Schizoaffective schizophrenia (HCC) 09/17/2013   Per notes from [facility] as on 2014   . Seizures (HCC)   . Stroke [facility])     PAST SURGICAL HISTORY:  Past Surgical History:  Procedure Laterality Date  . AMPUTATION Right 09/17/2013   Procedure: PARTIAL AMPUTATION 2ND TOE RIGHT FOOT;  Surgeon: Dallas Schimke, DPM;  Location: [facility];  Service: Podiatry;  Laterality: Right;  . FOOT SURGERY     rt and left  . LEG SURGERY Right    x7  . OPEN REDUCTION INTERNAL FIXATION (ORIF) DISTAL RADIAL FRACTURE Left 11/22/2017   Procedure: OPEN REDUCTION INTERNAL FIXATION (ORIF) DISTAL RADIAL FRACTURE and carpal tunnel  release;  Surgeon: Juanell Fairly, MD;  Location: [facility] ORS;  Service: Orthopedics;  Laterality: Left;    SOCIAL HISTORY:  Social History   Tobacco Use  . Smoking status: Current Every Day Smoker    Packs/day: 1.00    Years: 20.00    Pack years: 20.00    Types: Cigarettes  . Smokeless tobacco: Former Engineer, water Use Topics  . Alcohol use: Yes    Alcohol/week: 3.0 standard drinks    Types: 3 Cans of beer per week    Comment: Per patient 0.5 gallon to gallon when she can but since being in group home she can't do it as often     FAMILY HISTORY:  Family History  Problem Relation Age of Onset  . Bipolar disorder Mother   . Cancer Maternal Grandmother   . Diabetes Maternal Grandfather   . Hypertension Maternal Grandfather     DRUG ALLERGIES:  Allergies  Allergen Reactions  . Diphenhydramine Hcl Rash    Reaction:  Lowers Dilantin level   . Fluoxetine Other (See Comments)    Reaction:  Altered mental status   . Baclofen Other (See Comments)    Drops blood pressure, per pt  . Depakote [Divalproex Sodium] Other (See Comments)    Reaction:  Seizures and drops pts BP  . Quetiapine Other (See Comments)    Reaction:  Drops pts BP    REVIEW OF SYSTEMS:  Could not be obtained as patient is lethargic  and sleepy  MEDICATIONS AT HOME:  Prior to Admission medications   Medication Sig Start Date End Date Taking? Authorizing Provider  ARIPiprazole (ABILIFY) 5 MG tablet Take 5 mg by mouth daily.   Yes [provider]  cholecalciferol (VITAMIN D) 1000 units tablet Take 1,000 Units by mouth daily.   Yes [provider]  FLUoxetine (PROZAC) 20 MG capsule Take 20 mg by mouth daily.   Yes [provider]  gabapentin (NEURONTIN) 300 MG capsule Take 300 mg by mouth 2 (two) times daily.   Yes [provider]  hydrOXYzine (ATARAX/VISTARIL) 50 MG tablet Take 50 mg by mouth 3 (three) times daily.   Yes [provider]  levETIRAcetam (KEPPRA) 250  MG tablet Take 250 mg by mouth at bedtime.   Yes [provider]  risperiDONE (RISPERDAL) 3 MG tablet Take 3 mg by mouth 2 (two) times daily.   Yes [provider]  benztropine (COGENTIN) 0.5 MG tablet Take 0.5 mg by mouth at bedtime.    [provider]  citalopram (CELEXA) 20 MG tablet Take 20 mg by mouth daily.    [provider]  docusate sodium (COLACE) 100 MG capsule Take 1 capsule (100 mg total) by mouth 2 (two) times daily. 11/24/17   Juanell Fairly, MD  nicotine polacrilex (NICORETTE) 2 MG gum Take 1 each (2 mg total) by mouth as needed for smoking cessation. Patient not taking: Reported on 07/06/2015 03/24/15   [name], NP  oxyCODONE (OXY IR/ROXICODONE) 5 MG immediate release tablet Take 1 tablet (5 mg total) by mouth every 4 (four) hours as needed for moderate pain or severe pain. 11/24/17   Juanell Fairly, MD  perphenazine (TRILAFON) 2 MG tablet Take 2-6 mg 2 (two) times daily by mouth. Take 1 tablet by mouth every morning and 3 tablets by mouth at bedtime    [provider]      PHYSICAL EXAMINATION:   VITAL SIGNS: Blood pressure 107/77, pulse 77, resp. rate 14, SpO2 97 %.  GENERAL:  32 y.o.-year-old patient lying in the bed lethargic and sleepy EYES: Pupils equal, round, reactive to light and accommodation. No scleral icterus. Extraocular muscles intact.  HEENT: Head atraumatic, normocephalic. Oropharynx and nasopharynx clear.  NECK:  Supple, no jugular venous distention. No thyroid enlargement, no tenderness.  LUNGS: Normal breath sounds bilaterally, no wheezing, rales,rhonchi or crepitation. No use of accessory muscles of respiration.  CARDIOVASCULAR: S1, S2 normal. No murmurs, rubs, or gallops.  ABDOMEN: Soft, nontender, nondistended. Bowel sounds present. No organomegaly or mass.  EXTREMITIES: No pedal edema, cyanosis, or clubbing.  NEUROLOGIC: Lethargic and sleepy Arousable to loud verbal commands Complete nervous system  exam not possible Moves all extremities PSYCHIATRIC: The patient is alert and oriented x none SKIN: Left fore arm sutures noted Swelling with erythema noted  LABORATORY PANEL:   CBC Recent Labs  Lab 12/02/17 1420  WBC 3.7*  HGB 11.9*  HCT 36.0  PLT 222  MCV 98.9  MCH 32.7  MCHC 33.1  RDW 13.1   ------------------------------------------------------------------------------------------------------------------  Chemistries  Recent Labs  Lab 12/02/17 1420  NA 139  K 3.9  CL 106  CO2 22  GLUCOSE 95  BUN 5*  CREATININE 0.51  CALCIUM 8.5*  MG 2.3  AST 34  ALT 21  ALKPHOS 65  BILITOT 0.3   ------------------------------------------------------------------------------------------------------------------ estimated creatinine clearance is 86.4 mL/min (by C-G formula based on SCr of 0.51 mg/dL). ------------------------------------------------------------------------------------------------------------------ No results for input(s): TSH, T4TOTAL, T3FREE, THYROIDAB in the last  72 hours.  Invalid input(s): FREET3   Coagulation profile No results for input(s): INR, PROTIME in the last 168 hours. ------------------------------------------------------------------------------------------------------------------- No results for input(s): DDIMER in the last 72 hours. -------------------------------------------------------------------------------------------------------------------  Cardiac Enzymes No results for input(s): CKMB, TROPONINI, MYOGLOBIN in the last 168 hours.  Invalid input(s): CK ------------------------------------------------------------------------------------------------------------------ Invalid input(s): POCBNP  ---------------------------------------------------------------------------------------------------------------  Urinalysis    Component Value Date/Time   COLORURINE STRAW (A) 07/24/2016 0203   APPEARANCEUR CLEAR (A) 07/24/2016 0203    LABSPEC 1.002 (L) 07/24/2016 0203   PHURINE 6.0 07/24/2016 0203   GLUCOSEU 50 (A) 07/24/2016 0203   HGBUR NEGATIVE 07/24/2016 0203   BILIRUBINUR NEGATIVE 07/24/2016 0203   KETONESUR NEGATIVE 07/24/2016 0203   PROTEINUR NEGATIVE 07/24/2016 0203   UROBILINOGEN 0.2 12/30/2014 1535   NITRITE NEGATIVE 07/24/2016 0203   LEUKOCYTESUR NEGATIVE 07/24/2016 0203     RADIOLOGY: Dg Forearm Left  Result Date: 12/02/2017 CLINICAL DATA:  Followup ORIF. EXAM: LEFT FOREARM - 2 VIEW COMPARISON:  11/22/2017 FINDINGS: There is now complete displacement at the previously nondisplaced distal ulnar diaphyseal fracture. The bone is displaced medially by the with of the bone and dorsally by half the width of the bone. Hardware previously placed for reduction and fixation of the displaced distal radial fracture remains in place, without convincing change in position or alignment based on the views provided. IMPRESSION: Previous ORIF of distal radial fracture without discernible change. There is now complete displacement of the previously nondisplaced distal ulnar diaphyseal fracture as described above. Electronically Signed   By: Paulina Fusi M.D.   On: 12/02/2017 14:05    EKG: Orders placed or performed during the hospital encounter of 12/02/17  . ED EKG  . ED EKG    IMPRESSION AND PLAN:  32 year old female patient with history of alcohol abuse, schizoaffective schizophrenia, cerebral palsy, intellectual disability, substance abuse, seizure disorder, hepatitis C currently in the emergency room for medication overdose, alcohol intoxication  -Altered mental status secondary to medication overdose Admit patient to medical floor Monitor electrolytes and mental status  -Suicidal ideation One-on-one observation for patient safety Appreciate psychiatry consultation Transfer to inpatient behavioral unit once medically stable  -Alcohol intoxication CIWA protocol Thiamine and folic acid  supplements  -Displaced ulnar fracture Patient removed the cast previously intact aligned bone fragments have been displaced Orthopedic surgery consultation  -DVT prophylaxis subcu Lovenox daily  -Schizoaffective schizophrenia Psychiatry follow-up  -Substance abuse Substance abuse counseling will be given once patient is more awake and lucid  -Left hand cellulitis Start iv clindamycin antibiotics  All the records are reviewed and case discussed with ED provider. Management plans discussed with the patient, family and they are in agreement.  CODE STATUS:Full code Code Status History    Date Active Date Inactive Code Status Order ID Comments User Context   11/22/2017 2003 11/24/2017 1934 Full Code [id number]  Juanell Fairly, MD Inpatient   11/22/2017 0941 11/22/2017 2002 Full Code [id number]  Juanell Fairly, MD Inpatient   02/04/2017 0132 02/04/2017 2130 Full Code [id number]  Rebecka Apley, MD ED   09/13/2016 1939 09/14/2016 0623 Full Code [id number]  Arnaldo Natal, MD ED   03/17/2015 0514 03/24/2015 1802 Full Code [id number]  Worthy Flank, NP Inpatient   09/14/2013 2152 09/20/2013 1846 Full Code [id number]  Haydee Monica, MD Inpatient       TOTAL TIME TAKING CARE OF THIS PATIENT: 53 minutes.    Ihor Austin M.D on 12/02/2017 at 5:29 PM  Between 7am to 6pm - Pager - [phone]  After 6pm go to [email or web address] - password [email or web address] [facility]  [facility]  Office  [phone]  CC: Primary care physician; Haubert, Vernell Leep, PA-C

## 2017-12-02 NOTE — ED Triage Notes (Signed)
Pt brought to ER under IVC. IVC papers taken out by [facility] from [employer] [facility]  Pt has removed cast from left arm - stitches still present.  Per Rene Kocher pt took a weeks worth of her bedtime medications in the last 2 hours.

## 2017-12-02 NOTE — ED Provider Notes (Addendum)
[facility] Emergency Department Provider Note  ____________________________________________  Time seen: Approximately 1:35 PM  I have reviewed the triage vital signs and the nursing notes.   HISTORY  Chief Complaint Psychiatric Evaluation    HPI Sylvia [name] is a 32 y.o. female with a history of alcohol dependence, polysubstance abuse, schizoaffective schizophrenia, cerebral palsy, cognitive impairment, recent ORIF of the left radius, brought by police for suicidal ideations.  The patient has tangential thinking and is unable to give me a full history.  Per report, the patient was acting out, aggressive, and telling her caregivers that she plan to commit suicide.  It is also reported that she overdosed and took all of her evening medications for 7 days within the last 2 hours.  The medications and total dosages are listed in the nursing note.  The patient does not report any vomiting.  In addition, the patient self discontinued her cast, and is concerned about her arm; there is swelling, erythema and warmth circumferentially around her surgical site.   Past Medical History:  Diagnosis Date  . Alcoholism (HCC)   . CP (cerebral palsy) (HCC)   . Hepatitis C   . Herpes   . Polysubstance abuse (HCC)   . Schizoaffective schizophrenia (HCC) 09/17/2013   Per notes from [facility] as on 2014   . Seizures (HCC)   . Stroke [facility])     Patient Active Problem List   Diagnosis Date Noted  . SIRS (systemic inflammatory response syndrome) (HCC) 12/02/2017  . Radius fracture 11/22/2017  . Substance induced mood disorder (HCC) 07/24/2016  . 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 Cocaine use disorder, moderate, dependence (HCC) 03/17/2015  . Cannabis use disorder, mild, abuse 03/17/2015  . Alcohol use disorder, severe, dependence (HCC) 03/17/2015  . Seizure disorder (HCC) 09/17/2013  . Cellulitis and abscess of toe of right foot 09/16/2013  . Hepatitis C   . CP  (cerebral palsy) (HCC)     Past Surgical History:  Procedure Laterality Date  . AMPUTATION Right 09/17/2013   Procedure: PARTIAL AMPUTATION 2ND TOE RIGHT FOOT;  Surgeon: Dallas Schimke, DPM;  Location: [facility];  Service: Podiatry;  Laterality: Right;  . FOOT SURGERY     rt and left  . LEG SURGERY Right    x7  . OPEN REDUCTION INTERNAL FIXATION (ORIF) DISTAL RADIAL FRACTURE Left 11/22/2017   Procedure: OPEN REDUCTION INTERNAL FIXATION (ORIF) DISTAL RADIAL FRACTURE and carpal tunnel release;  Surgeon: Juanell Fairly, MD;  Location: [facility] ORS;  Service: Orthopedics;  Laterality: Left;    Current Outpatient Rx  . Order #: [id number] Class: Historical Med  . Order #: [id number] Class: Historical Med  . Order #: [id number] Class: Historical Med  . Order #: [id number] Class: Historical Med  . Order #: [id number] Class: Historical Med  . Order #: [id number] Class: Historical Med  . Order #: [id number] Class: Historical Med  . Order #: [id number] Class: Historical Med  . Order #: [id number] Class: Historical Med  . Order #: [id number] Class: Normal  . Order #: [id number] Class: Print  . Order #: [id number] Class: Print  . Order #: [id number] Class: Historical Med    Allergies Diphenhydramine hcl; Fluoxetine; Baclofen; Depakote [divalproex sodium]; and Quetiapine  Family History  Problem Relation Age of Onset  . Bipolar disorder Mother   . Cancer Maternal Grandmother   . Diabetes Maternal Grandfather   . Hypertension Maternal Grandfather     Social History Social History   Tobacco Use  . Smoking status: Current Every Day Smoker    Packs/day: 1.00  Years: 20.00    Pack years: 20.00    Types: Cigarettes  . Smokeless tobacco: Former Engineer, water Use Topics  . Alcohol use: Yes    Alcohol/week: 3.0 standard drinks    Types: 3 Cans of beer per week    Comment: Per patient 0.5 gallon to gallon when she can but since being in group home she can't do it as often   . Drug use: Yes    Types:  "Crack" cocaine, Marijuana, Cocaine    Comment: Per patient uses herion and crack and cocaine "once in a blue moon"    Review of Systems Constitutional: No fever/chills. Eyes: No visual changes. ENT: No sore throat. No congestion or rhinorrhea. Cardiovascular: Denies chest pain. Denies palpitations. Respiratory: Denies shortness of breath.  No cough. Gastrointestinal: No abdominal pain.  No nausea, no vomiting.  No diarrhea.  No constipation. Genitourinary: Negative for dysuria.  Concerned she might be pregnant. Musculoskeletal: Negative for back pain.  Left arm pain, swelling and erythema. Skin: Positive for rash. Neurological: Negative for headaches. No focal numbness, tingling or weakness.  Psychiatric:Positive agitation.  Positive SI.  Denies hallucinations.    ____________________________________________   PHYSICAL EXAM:  VITAL SIGNS: ED Triage Vitals  Enc Vitals Group     BP      Pulse      Resp      Temp      Temp src      SpO2      Weight      Height      Head Circumference      Peak Flow      Pain Score      Pain Loc      Pain Edu?      Excl. in GC?     Constitutional: The patient is alert but intermittently explosively agitated and hostile.  She has tangential thinking.  She is able to follow basic commands. Eyes: Conjunctivae are normal.  EOMI. No scleral icterus. Head: Atraumatic. Nose: No congestion/rhinnorhea. Mouth/Throat: Mucous membranes are mildly dry.  Neck: No stridor.  Supple.  No meningismus. Cardiovascular: Normal rate, regular rhythm. No murmurs, rubs or gallops.  Respiratory: Normal respiratory effort.  No accessory muscle use or retractions. Lungs CTAB.  No wheezes, rales or ronchi. Gastrointestinal: Soft, nontender and nondistended.  No guarding or rebound.  No peritoneal signs. Musculoskeletal: The patient has an 8 inch incision over the anterior aspect of the distal left forearm with sutures in place.  She has significant surrounding  swelling with erythema; no purulent discharge.  She has tenderness to palpation over the entire distal left arm.  She has a normal radial pulse with cap refill less than 2 seconds. Neurologic:  Alert.  Speech is clear.  Face and smile are symmetric.  EOMI.  Moves all extremities well. Skin:  Skin is warm, dry and intact. No rash noted. Psychiatric: The patient is intermittently agitated and tearful.  She has tangential thinking and denies suicidal ideation.   ____________________________________________   LABS (all labs ordered are listed, but only abnormal results are displayed)  Labs Reviewed  LACTIC ACID, PLASMA - Abnormal; Notable for the following components:      Result Value   Lactic Acid, Venous 2.4 (*)    All other components within normal limits  CBC - Abnormal; Notable for the following components:   WBC 3.7 (*)    RBC 3.64 (*)    Hemoglobin 11.9 (*)    All other components  within normal limits  COMPREHENSIVE METABOLIC PANEL - Abnormal; Notable for the following components:   BUN 5 (*)    Calcium 8.5 (*)    All other components within normal limits  ETHANOL - Abnormal; Notable for the following components:   Alcohol, Ethyl (B) 67 (*)    All other components within normal limits  SEDIMENTATION RATE - Abnormal; Notable for the following components:   Sed Rate 32 (*)    All other components within normal limits  ACETAMINOPHEN LEVEL - Abnormal; Notable for the following components:   Acetaminophen (Tylenol), Serum <10 (*)    All other components within normal limits  CULTURE, BLOOD (ROUTINE X 2)  CULTURE, BLOOD (ROUTINE X 2)  SALICYLATE LEVEL  MAGNESIUM  LACTIC ACID, PLASMA  URINALYSIS, COMPLETE (UACMP) WITH MICROSCOPIC  URINE DRUG SCREEN, QUALITATIVE ([facility] ONLY)  ACETAMINOPHEN LEVEL  POC URINE PREG, ED   ____________________________________________  EKG  ED ECG REPORT I, Anne-Caroline Sharma Covert, the attending physician, personally viewed and interpreted this  ECG.   Date: 12/02/2017  EKG Time: 1411  Rate: 87  Rhythm: normal sinus rhythm  Axis: normal  Intervals:none  ST&T Change: no STEMI  ____________________________________________  RADIOLOGY  Dg Forearm Left  Result Date: 12/02/2017 CLINICAL DATA:  Followup ORIF. EXAM: LEFT FOREARM - 2 VIEW COMPARISON:  11/22/2017 FINDINGS: There is now complete displacement at the previously nondisplaced distal ulnar diaphyseal fracture. The bone is displaced medially by the with of the bone and dorsally by half the width of the bone. Hardware previously placed for reduction and fixation of the displaced distal radial fracture remains in place, without convincing change in position or alignment based on the views provided. IMPRESSION: Previous ORIF of distal radial fracture without discernible change. There is now complete displacement of the previously nondisplaced distal ulnar diaphyseal fracture as described above. Electronically Signed   By: Paulina Fusi M.D.   On: 12/02/2017 14:05    ____________________________________________   PROCEDURES  Procedure(s) performed: None  Procedures  Critical Care performed: Yes ____________________________________________   INITIAL IMPRESSION / ASSESSMENT AND PLAN / ED COURSE  Pertinent labs & imaging results that were available during my care of the patient were reviewed by me and considered in my medical decision making (see chart for details).  32 y.o. female with cognitive impairment, multiple psychiatric illnesses, and recent left radius fracture repair presenting with agitation, SI, and an exam presentation concerning for left arm infection, cellulitis versus soft tissue infection.  Intermittently, the patient is cooperative, so we will get basic laboratory studies and an x-ray of her left forearm.  The patient will receive empiric clindamycin for cellulitis, and intravenous fluids.  Blood cultures as well as lactic acid have been ordered.  The patient  will be admitted for further evaluation and treatment.  From a psychiatric standpoint, the patient has been placed under IVC commitment.  ----------------------------------------- 3:21 PM on 12/02/2017 -----------------------------------------  From a psychiatric standpoint, the patient has been placed under IVC and has received Ativan.  She is resting at this time.  The recommended evaluation for her overdose is 6 hours observation.  Her alcohol is 67, her acetaminophen is less than 10, her salicylate is negative.  A 4h Tylenol is pending.  From an infectious standpoint, patient does meet sepsis criteria.  Her white blood cell count is low, her lactic acid is 2.4.  Cultures have been sent and the patient has received focused antibiotics for cellulitis and soft tissue infection.  She is also receiving intravenous fluids  for resuscitation.  The patient has been admitted for continued evaluation and treatment.  I spoke with Dr. Martha Clan who recommends splint for now, cast after cellulitis.  CRITICAL CARE Performed by: Rockne Menghini   Total critical care time: 40 minutes  Critical care time was exclusive of separately billable procedures and treating other patients.  Critical care was necessary to treat or prevent imminent or life-threatening deterioration.  Critical care was time spent personally by me on the following activities: development of treatment plan with patient and/or surrogate as well as nursing, discussions with consultants, evaluation of patient's response to treatment, examination of patient, obtaining history from patient or surrogate, ordering and performing treatments and interventions, ordering and review of laboratory studies, ordering and review of radiographic studies, pulse oximetry and re-evaluation of patient's condition.   ____________________________________________  FINAL CLINICAL IMPRESSION(S) / ED DIAGNOSES  Final diagnoses:  Suicidal ideation   Cellulitis of left upper extremity  Sepsis, due to unspecified organism, unspecified whether acute organ dysfunction present [facility])  Drug overdose, undetermined intent, initial encounter         NEW MEDICATIONS STARTED DURING THIS VISIT:  New Prescriptions   No medications on file      Rockne Menghini, MD 12/02/17 1527    Rockne Menghini, MD 12/02/17 1539    Rockne Menghini, MD 12/19/17 1523

## 2017-12-02 NOTE — ED Notes (Signed)
Per [employer] representative Sylvia [name]) patient has a legal guardian.  Sylvia [name] [phone] (patient's aunt)

## 2017-12-02 NOTE — Progress Notes (Signed)
CODE SEPSIS - PHARMACY COMMUNICATION  **[facility] Antibiotics should be administered within 1 hour of Sepsis diagnosis**  Time Code Sepsis Called/Page Received: 1502  Antibiotics Ordered: clindamycin  Time of 1st antibiotic administration: 1428  Additional action taken by pharmacy: N/A  If necessary, Name of Provider/Nurse Contacted:     Marty Heck ,PharmD Clinical Pharmacist  12/02/2017  3:38 PM

## 2017-12-02 NOTE — BH Assessment (Signed)
Unable to complete TTS Consult at this time, patient is too sedated to participate.

## 2017-12-02 NOTE — ED Notes (Signed)
[employer] representative stated the patient had taken a weeks worth of her bedtime medications.   Total amounts taken: gabapentin 2100 mg Risperdal 21 mg Keppra  1750 mg Hydroxyzine 350 mg  Poison control notified by this writer Recommendations:  If prolonged QT-C = optimal K+  mg+ Tylenol level 4 hours post OD  (1600) ASA ETOH CMP - mag Fluids benzos PRN Observe for 6 hours

## 2017-12-03 LAB — CBC
HCT: 32.2 % — ABNORMAL LOW (ref 36.0–46.0)
Hemoglobin: 10.3 g/dL — ABNORMAL LOW (ref 12.0–15.0)
MCH: 32.3 pg (ref 26.0–34.0)
MCHC: 32 g/dL (ref 30.0–36.0)
MCV: 100.9 fL — AB (ref 80.0–100.0)
NRBC: 0 % (ref 0.0–0.2)
PLATELETS: 220 10*3/uL (ref 150–400)
RBC: 3.19 MIL/uL — ABNORMAL LOW (ref 3.87–5.11)
RDW: 13.2 % (ref 11.5–15.5)
WBC: 5.9 10*3/uL (ref 4.0–10.5)

## 2017-12-03 LAB — URINE DRUG SCREEN, QUALITATIVE (ARMC ONLY)
Amphetamines, Ur Screen: NOT DETECTED
BARBITURATES, UR SCREEN: NOT DETECTED
Benzodiazepine, Ur Scrn: NOT DETECTED
COCAINE METABOLITE, UR ~~LOC~~: NOT DETECTED
Cannabinoid 50 Ng, Ur ~~LOC~~: NOT DETECTED
MDMA (ECSTASY) UR SCREEN: NOT DETECTED
Methadone Scn, Ur: NOT DETECTED
OPIATE, UR SCREEN: NOT DETECTED
Phencyclidine (PCP) Ur S: NOT DETECTED
Tricyclic, Ur Screen: NOT DETECTED

## 2017-12-03 LAB — BASIC METABOLIC PANEL
Anion gap: 5 (ref 5–15)
BUN: 6 mg/dL (ref 6–20)
CO2: 24 mmol/L (ref 22–32)
CREATININE: 0.65 mg/dL (ref 0.44–1.00)
Calcium: 7.9 mg/dL — ABNORMAL LOW (ref 8.9–10.3)
Chloride: 109 mmol/L (ref 98–111)
GFR calc non Af Amer: >60 mL/min (ref >60)
Glucose, Bld: 129 mg/dL — ABNORMAL HIGH (ref 70–99)
Potassium: 3.6 mmol/L (ref 3.5–5.1)
SODIUM: 138 mmol/L (ref 135–145)

## 2017-12-03 LAB — URINALYSIS, COMPLETE (UACMP) WITH MICROSCOPIC
BILIRUBIN URINE: NEGATIVE
Glucose, UA: NEGATIVE mg/dL
Hgb urine dipstick: NEGATIVE
Ketones, ur: NEGATIVE mg/dL
Leukocytes, UA: NEGATIVE
NITRITE: NEGATIVE
PH: 7 (ref 5.0–8.0)
Protein, ur: NEGATIVE mg/dL
SPECIFIC GRAVITY, URINE: 1.003 — AB (ref 1.005–1.030)

## 2017-12-03 LAB — LACTIC ACID, PLASMA: Lactic Acid, Venous: 1.9 mmol/L (ref 0.5–1.9)

## 2017-12-03 LAB — ACETAMINOPHEN LEVEL: Acetaminophen (Tylenol), Serum: <10 ug/mL — ABNORMAL LOW (ref 10–30)

## 2017-12-03 MED ORDER — CEFAZOLIN SODIUM-DEXTROSE 2-4 GM/100ML-% IV SOLN
2.0000 g | INTRAVENOUS | Status: AC
  Administered 2017-12-04: 2 g via INTRAVENOUS
  Filled 2017-12-03: qty 100

## 2017-12-03 MED ORDER — LEVETIRACETAM 250 MG PO TABS
250.0000 mg | ORAL_TABLET | Freq: Two times a day (BID) | ORAL | Status: DC
  Administered 2017-12-03 – 2017-12-05 (×4): 250 mg via ORAL
  Filled 2017-12-03 (×5): qty 1

## 2017-12-03 NOTE — Consult Note (Signed)
[place] Face-to-Face Psychiatry Consult   Reason for Consult: Consult for this 32 year old woman with a history of cognitive impairment alcohol abuse and mental illness Referring Physician: Posey Pronto Patient Identification: Sylvia [name] MRN:  [id number] Principal Diagnosis: Overdose Diagnosis:   Patient Active Problem List   Diagnosis Date Noted  . SIRS (systemic inflammatory response syndrome) ([place]) [R65.10] 12/02/2017  . Overdose [T50.901A] 12/02/2017  . Radius fracture [S52.90XA] 11/22/2017  . Substance induced mood disorder ([place]) [F19.94] 07/24/2016  . 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 Cocaine use disorder, moderate, dependence ([place]) [F14.20] 03/17/2015  . Cannabis use disorder, mild, abuse [F12.10] 03/17/2015  . Alcohol use disorder, severe, dependence ([place]) [F10.20] 03/17/2015  . Seizure disorder ([place]) [W10.272] 09/17/2013  . Cellulitis and abscess of toe of right foot [L03.031, L02.611] 09/16/2013  . Hepatitis C [B19.20]   . CP (cerebral palsy) ([place]) [G80.9]     Total Time spent with patient: 1 hour  Subjective:   Sylvia [name] is a 32 y.o. female patient admitted with "I did not take an overdose".  HPI: Patient seen chart reviewed.  Patient known from previous encounters.  This is a 32 year old woman with cerebral palsy and chronic intellectual disability who also has a long-standing problem with heavy alcohol abuse.  She has been diagnosed in the past with schizoaffective or bipolar disorder although the validity of those diagnoses could be questioned given the persistence of her alcohol problem.  She came to the hospital yesterday early afternoon on petition filed by her act team with a report that she had taken a full 7 days worth of her prescription medicines and also had on her own initiative removed a cast from her broken left arm.  Patient was very sedated when I tried to speak to her yesterday.  The medications that she may or may not of taken overdoses  of were primarily Keppra and gabapentin and possibly also hydroxyzine and unlikely to be measurable by any blood test.  Today the patient is awake and cooperative with the interview although irritable.  She tells me that she did not take any overdose of her pills.  Denies taking any extra pills whatsoever.  She says that she cut the cast off of her arm because it was smelling bad and uncomfortable and she believed that it needed to be recasted.  It does not look like she had actually bothered to get in touch with anyone had orthopedic surgery she just took it off on her own initiative and then realize she had no way to get over to the hospital.  Patient is a little difficult to interview given how irritable she is but she admits that she is continuing to drink heavily.  The exact amount varies several times during the interview between a 6 pack a day and 12-24 beers a day.  Denies that she is been using any cocaine.  Patient says her mood has been okay and she denies any suicidal ideation.  She is unwilling to consider the obvious possibility that while intoxicated she might of done something that she does not remember.  Social history: Patient's aunt is her legal guardian.  Patient lives in independent living but has close supervision from an act team.  Despite this she continues to drink heavily which is a major part of her problem.  Medical history: Cerebral palsy causing some limitation in her use of her right upper extremity.  Hepatitis C currently has a left arm radius fracture.  She makes the point  that given her cerebral palsy having a broken left arm was particularly difficult for her and she was having trouble doing any kind of even hygiene for herself with the cast on which is another reason she gives for taking it off.  Substance abuse history: Long-standing alcohol abuse.  She has had alcohol withdrawal seizures and also has had seizures that may or may not be unrelated to alcohol abuse.   Intermittent abuse of cocaine as well.  Past Psychiatric History: Multiple visits to the hospital both for behavior problems mood complaints and intoxication as well as medical injuries.  Diagnosis of schizophrenia has been made and she has been treated with antipsychotics although she has frequently told me that she never complies with those outside the hospital.  In my experience working with her once she sobers up she is usually free of any psychotic symptoms although she often is still disorganized in her thinking possibly from her intellectual disability.  She has a history of acting out and self injury especially when she is drunk.  Risk to Self:   Risk to Others:   Prior Inpatient Therapy:   Prior Outpatient Therapy:    Past Medical History:  Past Medical History:  Diagnosis Date  . Alcoholism ([place])   . CP (cerebral palsy) ([place])   . Hepatitis C   . Herpes   . Polysubstance abuse ([place])   . Schizoaffective schizophrenia ([place]) 09/17/2013   Per notes from [facility] as on 2014   . Seizures ([place])   . Stroke [facility])     Past Surgical History:  Procedure Laterality Date  . 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 FOOT SURGERY     rt and left  . LEG SURGERY Right    x7  . OPEN REDUCTION INTERNAL FIXATION (ORIF) DISTAL RADIAL FRACTURE Left 11/22/2017   Procedure: OPEN REDUCTION INTERNAL FIXATION (ORIF) DISTAL RADIAL FRACTURE and carpal tunnel release;  Surgeon: Thornton Park, MD;  Location: [facility] ORS;  Service: Orthopedics;  Laterality: Left;   Family History:  Family History  Problem Relation Age of Onset  . Bipolar disorder Mother   . Cancer Maternal Grandmother   . Diabetes Maternal Grandfather   . Hypertension Maternal Grandfather    Family Psychiatric  History: Bipolar disorder in a mother Social History:  Social History   Substance and  Sexual Activity  Alcohol Use Yes  . Alcohol/week: 3.0 standard drinks  . Types: 3 Cans of beer per week   Comment: Per patient 0.5 gallon to gallon when she can but since being in group home she can't do it as often      Social History   Substance and Sexual Activity  Drug Use Yes  . Types: "Crack" cocaine, Marijuana, Cocaine   Comment: Per patient uses herion and crack and cocaine "once in a blue moon"    Social History   Socioeconomic History  . Marital status: Single    Spouse name: Not on file  . Number of children: Not on file  . Years of education: Not on file  . Highest education level: Not on file  Occupational History  . Not on file  Social Needs  . Financial resource strain: Not on file  . Food insecurity:    Worry: Not on file    Inability: Not on file  . Transportation needs:    Medical:  Not on file    Non-medical: Not on file  Tobacco Use  . Smoking status: Current Every Day Smoker    Packs/day: 1.00    Years: 20.00    Pack years: 20.00    Types: Cigarettes  . Smokeless tobacco: Former Network engineer and Sexual Activity  . Alcohol use: Yes    Alcohol/week: 3.0 standard drinks    Types: 3 Cans of beer per week    Comment: Per patient 0.5 gallon to gallon when she can but since being in group home she can't do it as often   . Drug use: Yes    Types: "Crack" cocaine, Marijuana, Cocaine    Comment: Per patient uses herion and crack and cocaine "once in a blue moon"  . Sexual activity: Not Currently    Birth control/protection: Implant  Lifestyle  . Physical activity:    Days per week: Not on file    Minutes per session: Not on file  . Stress: Not on file  Relationships  . Social connections:    Talks on phone: Not on file    Gets together: Not on file    Attends religious service: Not on file    Active member of club or organization: Not on file    Attends meetings of clubs or organizations: Not on file    Relationship status: Not on file  Other  Topics Concern  . Not on file  Social History Narrative   Lives at home by herself, only has acquaintances here   Does not work    Additional Social History:    Allergies:   Allergies  Allergen Reactions  . Diphenhydramine Hcl Rash    Reaction:  Lowers Dilantin level   . Fluoxetine Other (See Comments)    Reaction:  Altered mental status   . Baclofen Other (See Comments)    Drops blood pressure, per pt  . Depakote [Divalproex Sodium] Other (See Comments)    Reaction:  Seizures and drops pts BP  . Quetiapine Other (See Comments)    Reaction:  Drops pts BP    Labs:  Results for orders placed or performed during the hospital encounter of 12/02/17 (from the past 48 hour(s))  Blood culture (routine x 2)     Status: None (Preliminary result)   Collection Time: 12/02/17  2:20 PM  Result Value Ref Range   Specimen Description BLOOD BLOOD RIGHT HAND    Special Requests      BOTTLES DRAWN AEROBIC AND ANAEROBIC Blood Culture adequate volume   Culture      NO GROWTH < 24 HOURS Performed at [facility], [address], [place], [place] [address]    Report Status PENDING   Lactic acid, plasma     Status: Abnormal   Collection Time: 12/02/17  2:20 PM  Result Value Ref Range   Lactic Acid, Venous 2.4 (HH) 0.5 - 1.9 mmol/L    Comment: CRITICAL RESULT CALLED TO, READ BACK BY AND VERIFIED WITH KELLY PENDLETON 12/02/17 [place] Performed at [facility], [address] [place], [place] [address]   CBC     Status: Abnormal   Collection Time: 12/02/17  2:20 PM  Result Value Ref Range   WBC 3.7 (L) 4.0 - 10.5 K/uL   RBC 3.64 (L) 3.87 - 5.11 MIL/uL   Hemoglobin 11.9 (L) 12.0 - 15.0 g/dL   HCT 36.0 36.0 - 46.0 %   MCV 98.9 80.0 - 100.0 fL   MCH 32.7  26.0 - 34.0 pg   MCHC 33.1 30.0 - 36.0 g/dL   RDW 13.1 11.5 - 15.5 %   Platelets 222 150 - 400 K/uL   nRBC 0.0 0.0 - 0.2 %    Comment: Performed at [facility], [address] [place], [place] [address]   Comprehensive metabolic panel     Status: Abnormal   Collection Time: 12/02/17  2:20 PM  Result Value Ref Range   Sodium 139 135 - 145 mmol/L   Potassium 3.9 3.5 - 5.1 mmol/L   Chloride 106 98 - 111 mmol/L   CO2 22 22 - 32 mmol/L   Glucose, Bld 95 70 - 99 mg/dL   BUN 5 (L) 6 - 20 mg/dL   Creatinine, Ser 0.51 0.44 - 1.00 mg/dL   Calcium 8.5 (L) 8.9 - 10.3 mg/dL   Total Protein 7.2 6.5 - 8.1 g/dL   Albumin 3.9 3.5 - 5.0 g/dL   AST 34 15 - 41 U/L   ALT 21 0 - 44 U/L   Alkaline Phosphatase 65 38 - 126 U/L   Total Bilirubin 0.3 0.3 - 1.2 mg/dL   GFR calc non Af Amer >60 >60 mL/min   GFR calc Af Amer >60 >60 mL/min    Comment: (NOTE) The eGFR has been calculated using the CKD EPI equation. This calculation has not been validated in all clinical situations. eGFR's persistently <60 mL/min signify possible Chronic Kidney Disease.    Anion gap 11 5 - 15    Comment: Performed at [facility], [address] [place], [place] [address]  Ethanol     Status: Abnormal   Collection Time: 12/02/17  2:20 PM  Result Value Ref Range   Alcohol, Ethyl (B) 67 (H) <10 mg/dL    Comment: (NOTE) Lowest detectable limit for serum alcohol is 10 mg/dL. For medical purposes only. Performed at [facility], [address] [place], [place] [address]   Sedimentation rate     Status: Abnormal   Collection Time: 12/02/17  2:20 PM  Result Value Ref Range   Sed Rate 32 (H) 0 - 20 mm/hr    Comment: Performed at [facility], [address] [place], [place] [address]  Acetaminophen level     Status: Abnormal   Collection Time: 12/02/17  2:20 PM  Result Value Ref Range   Acetaminophen (Tylenol), Serum <10 (L) 10 - 30 ug/mL    Comment: (NOTE) Therapeutic concentrations vary significantly. A range of 10-30 ug/mL  may be an effective concentration for many patients. However, some  are best treated at concentrations outside of this range. Acetaminophen concentrations >150 ug/mL  at 4 hours after ingestion  and >50 ug/mL at 12 hours after ingestion are often associated with  toxic reactions. Performed at [facility], [address] [place], [place] [address]   Salicylate level     Status: None   Collection Time: 12/02/17  2:20 PM  Result Value Ref Range   Salicylate Lvl <7.7 2.8 - 30.0 mg/dL    Comment: Performed at [facility], [address] [place], [place] [address]  Magnesium     Status: None   Collection Time: 12/02/17  2:20 PM  Result Value Ref Range   Magnesium 2.3 1.7 - 2.4 mg/dL    Comment: Performed at [facility], [address] [place], [place] [address]  Urinalysis, Complete w Microscopic     Status: Abnormal   Collection Time: 12/02/17  2:37 PM  Result Value Ref Range   Color, Urine YELLOW (A) YELLOW   APPearance CLEAR (A) CLEAR   Specific Gravity, Urine 1.003 (L) 1.005 - 1.030   pH 7.0 5.0 - 8.0   Glucose, UA NEGATIVE NEGATIVE mg/dL   Hgb urine dipstick NEGATIVE NEGATIVE   Bilirubin Urine NEGATIVE NEGATIVE   Ketones, ur NEGATIVE NEGATIVE mg/dL   Protein, ur NEGATIVE NEGATIVE mg/dL   Nitrite NEGATIVE NEGATIVE   Leukocytes, UA NEGATIVE NEGATIVE    Comment: Performed at [facility], [address] [place], [place] [address]  Urine Drug Screen, Qualitative ([facility] only)     Status: None   Collection Time: 12/02/17  2:37 PM  Result Value Ref Range   Tricyclic, Ur Screen NONE DETECTED NONE DETECTED   Amphetamines, Ur Screen NONE DETECTED NONE DETECTED   MDMA (Ecstasy)Ur Screen NONE DETECTED NONE DETECTED   Cocaine Metabolite,Ur [place] NONE DETECTED NONE DETECTED   Opiate, Ur Screen NONE DETECTED NONE DETECTED   Phencyclidine (PCP) Ur S NONE DETECTED NONE DETECTED   Cannabinoid 50 Ng, Ur [place] NONE DETECTED NONE DETECTED   Barbiturates, Ur Screen NONE DETECTED NONE DETECTED   Benzodiazepine, Ur Scrn NONE DETECTED NONE DETECTED   Methadone Scn, Ur NONE DETECTED NONE DETECTED    Comment:  (NOTE) Tricyclics + metabolites, urine    Cutoff 1000 ng/mL Amphetamines + metabolites, urine  Cutoff 1000 ng/mL MDMA (Ecstasy), urine              Cutoff 500 ng/mL Cocaine Metabolite, urine          Cutoff 300 ng/mL Opiate + metabolites, urine        Cutoff 300 ng/mL Phencyclidine (PCP), urine         Cutoff 25 ng/mL Cannabinoid, urine                 Cutoff 50 ng/mL Barbiturates + metabolites, urine  Cutoff 200 ng/mL Benzodiazepine, urine              Cutoff 200 ng/mL Methadone, urine                   Cutoff 300 ng/mL The urine drug screen provides only a preliminary, unconfirmed analytical test result and should not be used for non-medical purposes. Clinical consideration and professional judgment should be applied to any positive drug screen result due to possible interfering substances. A more specific alternate chemical method must be used in order to obtain a confirmed analytical result. Gas chromatography / mass spectrometry (GC/MS) is the preferred confirmat ory method. Performed at [facility], [address] [place], [place] [address]   Blood culture (routine x 2)     Status: None (Preliminary result)   Collection Time: 12/02/17 11:04 PM  Result Value Ref Range   Specimen Description BLOOD RIGHT HAND    Special Requests      BOTTLES DRAWN AEROBIC ONLY Blood Culture adequate volume   Culture      NO GROWTH < 12 HOURS Performed at [facility], [address], [place], [place] [address]    Report Status PENDING   Acetaminophen level     Status: Abnormal   Collection Time: 12/02/17 11:54 PM  Result Value Ref Range   Acetaminophen (Tylenol), Serum <10 (L) 10 - 30 ug/mL    Comment: (NOTE) Therapeutic concentrations vary significantly. A range of 10-30 ug/mL  may be an effective concentration for many patients. However, some  are best treated at concentrations outside of this  range. Acetaminophen concentrations >150 ug/mL at 4 hours after  ingestion  and >50 ug/mL at 12 hours after ingestion are often associated with  toxic reactions. Performed at [facility], [address] [place], [place] [address]   Lactic acid, plasma     Status: None   Collection Time: 12/02/17 11:54 PM  Result Value Ref Range   Lactic Acid, Venous 1.9 0.5 - 1.9 mmol/L    Comment: Performed at [facility], [address] [place], [place] [address]  Basic metabolic panel     Status: Abnormal   Collection Time: 12/03/17  3:30 AM  Result Value Ref Range   Sodium 138 135 - 145 mmol/L   Potassium 3.6 3.5 - 5.1 mmol/L   Chloride 109 98 - 111 mmol/L   CO2 24 22 - 32 mmol/L   Glucose, Bld 129 (H) 70 - 99 mg/dL   BUN 6 6 - 20 mg/dL   Creatinine, Ser 0.65 0.44 - 1.00 mg/dL   Calcium 7.9 (L) 8.9 - 10.3 mg/dL   GFR calc non Af Amer >60 >60 mL/min   GFR calc Af Amer >60 >60 mL/min    Comment: (NOTE) The eGFR has been calculated using the CKD EPI equation. This calculation has not been validated in all clinical situations. eGFR's persistently <60 mL/min signify possible Chronic Kidney Disease.    Anion gap 5 5 - 15    Comment: Performed at [facility], [address] [place], [place] [address]  CBC     Status: Abnormal   Collection Time: 12/03/17  3:30 AM  Result Value Ref Range   WBC 5.9 4.0 - 10.5 K/uL   RBC 3.19 (L) 3.87 - 5.11 MIL/uL   Hemoglobin 10.3 (L) 12.0 - 15.0 g/dL   HCT 32.2 (L) 36.0 - 46.0 %   MCV 100.9 (H) 80.0 - 100.0 fL   MCH 32.3 26.0 - 34.0 pg   MCHC 32.0 30.0 - 36.0 g/dL   RDW 13.2 11.5 - 15.5 %   Platelets 220 150 - 400 K/uL   nRBC 0.0 0.0 - 0.2 %    Comment: Performed at [facility], [address] [place], [place] [address]    Current Facility-Administered Medications  Medication Dose Route Frequency Provider Last Rate Last Dose  . 0.9 %  sodium chloride infusion   Intravenous Continuous Saundra Shelling, MD 100 mL/hr at 12/03/17 0539    . acetaminophen (TYLENOL) tablet 650  mg  650 mg Oral Q6H PRN Saundra Shelling, MD       Or  . acetaminophen (TYLENOL) suppository 650 mg  650 mg Rectal Q6H PRN Pyreddy, Reatha Harps, MD      . benztropine (COGENTIN) tablet 0.5 mg  0.5 mg Oral QHS Saundra Shelling, MD   Stopped at 12/02/17 2120  . ceFAZolin (ANCEF) IVPB 2g/100 mL premix  2 g Intravenous 30 min Pre-Op Thornton Park, MD      . cholecalciferol (VITAMIN D) tablet 1,000 Units  1,000 Units Oral Daily Saundra Shelling, MD   1,000 Units at 12/03/17 0839  . docusate sodium (COLACE) capsule 100 mg  100 mg Oral BID Pyreddy, Reatha Harps, MD      . enoxaparin (LOVENOX) injection 40 mg  40 mg Subcutaneous Q24H Pyreddy, Pavan, MD      . folic acid (FOLVITE) tablet 1 mg  1 mg Oral Daily Pyreddy, Reatha Harps, MD   1 mg at 12/03/17 0837  . gabapentin (NEURONTIN) capsule 300 mg  300 mg Oral BID  Saundra Shelling, MD   Stopped at 12/02/17 2119  . hydrOXYzine (ATARAX/VISTARIL) tablet 50 mg  50 mg Oral TID Saundra Shelling, MD   Stopped at 12/02/17 2119  . levETIRAcetam (KEPPRA) tablet 250 mg  250 mg Oral BID Clapacs, John T, MD      . LORazepam (ATIVAN) tablet 1 mg  1 mg Oral Q6H PRN Saundra Shelling, MD   1 mg at 12/03/17 1138   Or  . LORazepam (ATIVAN) injection 1 mg  1 mg Intravenous Q6H PRN Pyreddy, Reatha Harps, MD      . LORazepam (ATIVAN) tablet 0-4 mg  0-4 mg Oral Q6H Pyreddy, Pavan, MD   1 mg at 12/02/17 2355   Followed by  . [START ON 12/04/2017] LORazepam (ATIVAN) tablet 0-4 mg  0-4 mg Oral Q12H Pyreddy, Pavan, MD      . multivitamin with minerals tablet 1 tablet  1 tablet Oral Daily Saundra Shelling, MD   1 tablet at 12/03/17 0838  . nicotine (NICODERM CQ - dosed in mg/24 hours) patch 21 mg  21 mg Transdermal Daily Pyreddy, Reatha Harps, MD   21 mg at 12/03/17 0836  . ondansetron (ZOFRAN) tablet 4 mg  4 mg Oral Q6H PRN Saundra Shelling, MD       Or  . ondansetron (ZOFRAN) injection 4 mg  4 mg Intravenous Q6H PRN Pyreddy, Pavan, MD      . oxyCODONE (Oxy IR/ROXICODONE) immediate release tablet 5 mg  5 mg Oral Q4H PRN  Saundra Shelling, MD   5 mg at 12/03/17 1344  . thiamine (VITAMIN B-1) tablet 100 mg  100 mg Oral Daily Pyreddy, Reatha Harps, MD   100 mg at 12/03/17 2774   Or  . thiamine (B-1) injection 100 mg  100 mg Intravenous Daily Pyreddy, Reatha Harps, MD        Musculoskeletal: Strength & Muscle Tone: Has limitations especially in the right arm Gait & Station: normal Patient leans: N/A  Psychiatric Specialty Exam: Physical Exam  Nursing note and vitals reviewed. Constitutional: She appears well-developed and well-nourished.  HENT:  Head: Normocephalic and atraumatic.  Eyes: Pupils are equal, round, and reactive to light. Conjunctivae are normal.  Neck: Normal range of motion.  Cardiovascular: Regular rhythm and normal heart sounds.  Respiratory: Effort normal. No respiratory distress.  GI: Soft.  Musculoskeletal: Normal range of motion.  Neurological: She is alert.  Skin: Skin is warm and dry.  Psychiatric: Her affect is labile. Her speech is tangential. She is agitated. She is not aggressive. Thought content is not paranoid. Cognition and memory are impaired. She expresses impulsivity. She expresses no homicidal and no suicidal ideation. She exhibits abnormal recent memory and abnormal remote memory.    Review of Systems  Constitutional: Negative.   HENT: Negative.   Eyes: Negative.   Respiratory: Negative.   Cardiovascular: Negative.   Gastrointestinal: Negative.   Musculoskeletal: Negative.   Skin: Negative.   Neurological: Negative.   Psychiatric/Behavioral: Positive for substance abuse. Negative for depression, hallucinations, memory loss and suicidal ideas. The patient is not nervous/anxious and does not have insomnia.     Blood pressure 110/75, pulse 76, temperature 98.3 F (36.8 C), temperature source Oral, resp. rate 17, SpO2 100 %.There is no height or weight on file to calculate BMI.  General Appearance: Casual  Eye Contact:  Fair  Speech:  Pressured and Slurred  Volume:  Increased   Mood:  Irritable  Affect:  Congruent  Thought Process:  Disorganized  Orientation:  Full (Time, Place, and Person)  Thought Content:  Rumination and Tangential  Suicidal Thoughts:  No  Homicidal Thoughts:  No  Memory:  Immediate;   Fair Recent;   Fair Remote;   Fair  Judgement:  Impaired  Insight:  Shallow  Psychomotor Activity:  Decreased and Restlessness  Concentration:  Concentration: Poor  Recall:  Poor  Fund of Knowledge:  Poor  Language:  Poor  Akathisia:  No  Handed:  Left  AIMS (if indicated):     Assets:  Desire for Improvement Housing Resilience Social Support  ADL's:  Impaired  Cognition:  Impaired,  Mild  Sleep:        Treatment Plan Summary: Daily contact with patient to assess and evaluate symptoms and progress in treatment, Medication management and Plan Patient with alcohol abuse cognitive impairment and possible other mental illness.  She has sobered up and is much clearer than she was yesterday.  She is alert and oriented and understands her situation.  She is adamant that she is not suicidal and I believe this is probably correct.  In my experience she is not likely to be suicidal while sober.  Patient's insight is poor.  I expect that her act team was probably correct that she did overdose on her pills while she was drunk and in a blackout.  Patient is unwilling to accept even the possibility of that.  Nevertheless she seems to be suffering no long-term consequences.  She can be restarted back on the gabapentin and Keppra which are low doses anyway.  Continue detox medication.  I believe she is scheduled to have orthopedic surgery if possible tomorrow.  I will continue to follow up.  Patient's longer term prognosis is somewhat grim given her poor insight and her lack so far of any sobriety combined with what I expect is gradually worsening cognitive problems from drinking.  I will not change anything else except about her medicine at this point.  I have  discontinued the involuntary commitment in the one-on-one sitter  Disposition: No evidence of imminent risk to self or others at present.   Patient does not meet criteria for psychiatric inpatient admission. Supportive therapy provided about ongoing stressors. Discussed crisis plan, support from social network, calling 911, coming to the Emergency Department, and calling Suicide Hotline.  Alethia Berthold, MD 12/03/2017 4:07 PM

## 2017-12-03 NOTE — Progress Notes (Signed)
RN informed me that patient has been refusing a wrist splint.  I have explained the importance of the splint to the patient.  I personally placed the splint on her left wrist.   She will continue to elevate and ice the left forearm.   Patient is NPO after midnight.  Surgery tomorrow depends on results of urine drug screen.

## 2017-12-03 NOTE — Consult Note (Addendum)
ORTHOPAEDIC CONSULTATION  REQUESTING PHYSICIAN: Enedina Finner, MD  Chief Complaint: left wrist pain and swelling  HPI: Sylvia [name] is a 32 y.o. female who complains of left wrist pain and swelling.  Patient is status post ORIF of a left distal radius fracture on 11/22/2017.  Patient was placed in a short arm cast before she was discharged on 11/24/2017.  Patient has a history of alcoholism, polysubstance abuse, seizure disorder and schizoaffective schizophrenia.  Patient removed her cast.  She states she did this because no one would help her in the cast started to smell.  Patient never contacted our office.  Patient had a nondisplaced distal ulna fracture at the time of her surgery.  Upon re-x-ray in the ER the ulna fracture is nondisplaced.  Past Medical History:  Diagnosis Date  . Alcoholism (HCC)   . CP (cerebral palsy) (HCC)   . Hepatitis C   . Herpes   . Polysubstance abuse (HCC)   . Schizoaffective schizophrenia (HCC) 09/17/2013   Per notes from [facility] as on 2014   . Seizures (HCC)   . Stroke [facility])    Past Surgical History:  Procedure Laterality Date  . AMPUTATION Right 09/17/2013   Procedure: PARTIAL AMPUTATION 2ND TOE RIGHT FOOT;  Surgeon: Dallas Schimke, DPM;  Location: [facility];  Service: Podiatry;  Laterality: Right;  . FOOT SURGERY     rt and left  . LEG SURGERY Right    x7  . OPEN REDUCTION INTERNAL FIXATION (ORIF) DISTAL RADIAL FRACTURE Left 11/22/2017   Procedure: OPEN REDUCTION INTERNAL FIXATION (ORIF) DISTAL RADIAL FRACTURE and carpal tunnel release;  Surgeon: Juanell Fairly, MD;  Location: [facility] ORS;  Service: Orthopedics;  Laterality: Left;   Social History   Socioeconomic History  . Marital status: Single    Spouse name: Not on file  . Number of children: Not on file  . Years of education: Not on file  . Highest education level: Not on file  Occupational History  . Not on file  Social Needs  . Financial resource strain: Not on file  . Food  insecurity:    Worry: Not on file    Inability: Not on file  . Transportation needs:    Medical: Not on file    Non-medical: Not on file  Tobacco Use  . Smoking status: Current Every Day Smoker    Packs/day: 1.00    Years: 20.00    Pack years: 20.00    Types: Cigarettes  . Smokeless tobacco: Former Engineer, water and Sexual Activity  . Alcohol use: Yes    Alcohol/week: 3.0 standard drinks    Types: 3 Cans of beer per week    Comment: Per patient 0.5 gallon to gallon when she can but since being in group home she can't do it as often   . Drug use: Yes    Types: "Crack" cocaine, Marijuana, Cocaine    Comment: Per patient uses herion and crack and cocaine "once in a blue moon"  . Sexual activity: Not Currently    Birth control/protection: Implant  Lifestyle  . Physical activity:    Days per week: Not on file    Minutes per session: Not on file  . Stress: Not on file  Relationships  . Social connections:    Talks on phone: Not on file    Gets together: Not on file    Attends religious service: Not on file    Active member of club or organization: Not on file  Attends meetings of clubs or organizations: Not on file    Relationship status: Not on file  Other Topics Concern  . Not on file  Social History Narrative   Lives at home by herself, only has acquaintances here   Does not work    Family History  Problem Relation Age of Onset  . Bipolar disorder Mother   . Cancer Maternal Grandmother   . Diabetes Maternal Grandfather   . Hypertension Maternal Grandfather    Allergies  Allergen Reactions  . Diphenhydramine Hcl Rash    Reaction:  Lowers Dilantin level   . Fluoxetine Other (See Comments)    Reaction:  Altered mental status   . Baclofen Other (See Comments)    Drops blood pressure, per pt  . Depakote [Divalproex Sodium] Other (See Comments)    Reaction:  Seizures and drops pts BP  . Quetiapine Other (See Comments)    Reaction:  Drops pts BP   Prior to  Admission medications   Medication Sig Start Date End Date Taking? Authorizing Provider  ARIPiprazole (ABILIFY) 5 MG tablet Take 5 mg by mouth daily.   Yes [provider]  cholecalciferol (VITAMIN D) 1000 units tablet Take 1,000 Units by mouth daily.   Yes [provider]  FLUoxetine (PROZAC) 20 MG capsule Take 20 mg by mouth daily.   Yes [provider]  gabapentin (NEURONTIN) 300 MG capsule Take 300 mg by mouth 2 (two) times daily.   Yes [provider]  hydrOXYzine (ATARAX/VISTARIL) 50 MG tablet Take 50 mg by mouth 3 (three) times daily.   Yes [provider]  levETIRAcetam (KEPPRA) 250 MG tablet Take 250 mg by mouth at bedtime.   Yes [provider]  risperiDONE (RISPERDAL) 3 MG tablet Take 3 mg by mouth 2 (two) times daily.   Yes [provider]  benztropine (COGENTIN) 0.5 MG tablet Take 0.5 mg by mouth at bedtime.    [provider]  citalopram (CELEXA) 20 MG tablet Take 20 mg by mouth daily.    [provider]  docusate sodium (COLACE) 100 MG capsule Take 1 capsule (100 mg total) by mouth 2 (two) times daily. 11/24/17   Juanell Fairly, MD  nicotine polacrilex (NICORETTE) 2 MG gum Take 1 each (2 mg total) by mouth as needed for smoking cessation. Patient not taking: Reported on 07/06/2015 03/24/15   [name], NP  oxyCODONE (OXY IR/ROXICODONE) 5 MG immediate release tablet Take 1 tablet (5 mg total) by mouth every 4 (four) hours as needed for moderate pain or severe pain. 11/24/17   Juanell Fairly, MD  perphenazine (TRILAFON) 2 MG tablet Take 2-6 mg 2 (two) times daily by mouth. Take 1 tablet by mouth every morning and 3 tablets by mouth at bedtime    [provider]   Dg Forearm Left  Result Date: 12/02/2017 CLINICAL DATA:  Followup ORIF. EXAM: LEFT FOREARM - 2 VIEW COMPARISON:  11/22/2017 FINDINGS: There is now complete displacement at the previously nondisplaced distal ulnar diaphyseal  fracture. The bone is displaced medially by the with of the bone and dorsally by half the width of the bone. Hardware previously placed for reduction and fixation of the displaced distal radial fracture remains in place, without convincing change in position or alignment based on the views provided. IMPRESSION: Previous ORIF of distal radial fracture without discernible change. There is now complete displacement of the previously nondisplaced distal ulnar diaphyseal fracture as described above. Electronically Signed   By: Loraine Leriche  Shogry M.D.   On: 12/02/2017 14:05    Positive ROS: All other systems have been reviewed and were otherwise negative with the exception of those mentioned in the HPI and as above.  Physical Exam: General: no acute distress  MUSCULOSKELETAL: Left wrist: Patient's left wrist is swollen.  Her incision remains intact.  There is no drainage from her incision.  There is no associated erythema.  Patient has palpable crepitus of her ulna with palpation.  She can flex and extend all 5 digits of her left hand and states that she has intact sensation to light touch in all 5 digits today.  Assessment: Displaced left ulna fracture following ORIF of left distal radius fracture  Plan: Given that the patient is noncompliant I hesitate to treat the ulna nonoperatively.  I would recommend ORIF of the left ulna prevent malunion.  I would like to perform the surgery tomorrow.  Urine drug screen is being sent on the patient.  Depending on results of the screen surgery will be planned for tomorrow.  She will be n.p.o. after midnight.  Patient will be monitored overnight on the orthopedic floor after surgery and then may be discharged to behavioral medicine.  Patient will have her left wrist splinted.  If the urine drug screen is positive patient will have her left wrist splinted and surgery will need to be planned for next week.  Discussed this case with Dr. Enedina Finner who understands it and is in  agreement with this plan.  Patient was not splinted in the ER last night.  I gave a verbal order for a splint to the floor nurse overnight and yet the patient is still not splinted.   Patient is reported to be refusing the wrist splint.  Patient should continue to apply ice to left wrist as well as continue to elevate the left upper extremity on pillows.  These orders have been placed.  I have tentatively scheduled surgery for the patient tomorrow morning.  She will be the second case likely around 11:00.   Juanell Fairly, MD    12/03/2017 1:41 PM

## 2017-12-03 NOTE — Progress Notes (Signed)
[facility] - [place] at [facility]   PATIENT NAME: Sylvia [name]    MR#:  [id number]  DATE OF BIRTH:  1985-10-06  SUBJECTIVE:  came in after she took seven day worth all her psych meds. Patient denies suicidal ideation this morning. She has sitter in the room. She is asking to get her psych meds be restarted. Eating breakfast she reports drinking heavy on a daily basis. Last drink was yesterday. Tells me lately she has been drinking less. Does not qualify. REVIEW OF SYSTEMS:   Review of Systems  Constitutional: Negative for chills, fever and weight loss.  HENT: Negative for ear discharge, ear pain and nosebleeds.   Eyes: Negative for blurred vision, pain and discharge.  Respiratory: Negative for sputum production, shortness of breath, wheezing and stridor.   Cardiovascular: Negative for chest pain, palpitations, orthopnea and PND.  Gastrointestinal: Negative for abdominal pain, diarrhea, nausea and vomiting.  Genitourinary: Negative for frequency and urgency.  Musculoskeletal: Positive for falls and joint pain. Negative for back pain.  Neurological: Negative for sensory change, speech change, focal weakness and weakness.  Psychiatric/Behavioral: Positive for substance abuse. Negative for depression and hallucinations. The patient is not nervous/anxious.    Tolerating Diet: yes Tolerating PT: ambulatory  DRUG ALLERGIES:   Allergies  Allergen Reactions  . Diphenhydramine Hcl Rash    Reaction:  Lowers Dilantin level   . Fluoxetine Other (See Comments)    Reaction:  Altered mental status   . Baclofen Other (See Comments)    Drops blood pressure, per pt  . Depakote [Divalproex Sodium] Other (See Comments)    Reaction:  Seizures and drops pts BP  . Quetiapine Other (See Comments)    Reaction:  Drops pts BP    VITALS:  Blood pressure 90/60, pulse 75, temperature 98.8 F (37.1 C), temperature source Oral, resp. rate 18, SpO2 98 %.  PHYSICAL EXAMINATION:    Physical Exam  GENERAL:  32 y.o.-year-old patient lying in the bed with no acute distress.  EYES: Pupils equal, round, reactive to light and accommodation. No scleral icterus. Extraocular muscles intact.  HEENT: Head atraumatic, normocephalic. Oropharynx and nasopharynx clear.  NECK:  Supple, no jugular venous distention. No thyroid enlargement, no tenderness.  LUNGS: Normal breath sounds bilaterally, no wheezing, rales, rhonchi. No use of accessory muscles of respiration.  CARDIOVASCULAR: S1, S2 normal. No murmurs, rubs, or gallops.  ABDOMEN: Soft, nontender, nondistended. Bowel sounds present. No organomegaly or mass.  EXTREMITIES: No cyanosis, clubbing or edema b/l.   Left upper extremity swelling. No erythema. Sutures from previous surgery stable. Good radial pulse NEUROLOGIC: Cranial nerves II through XII are intact. No focal Motor or sensory deficits b/l.   PSYCHIATRIC:  patient is alert and oriented x 3.  SKIN: No obvious rash, lesion, or ulcer.   LABORATORY PANEL:  CBC Recent Labs  Lab 12/03/17 0330  WBC 5.9  HGB 10.3*  HCT 32.2*  PLT 220    Chemistries  Recent Labs  Lab 12/02/17 1420 12/03/17 0330  NA 139 138  K 3.9 3.6  CL 106 109  CO2 22 24  GLUCOSE 95 129*  BUN 5* 6  CREATININE 0.51 0.65  CALCIUM 8.5* 7.9*  MG 2.3  --   AST 34  --   ALT 21  --   ALKPHOS 65  --   BILITOT 0.3  --    Cardiac Enzymes No results for input(s): TROPONINI in the last 168 hours. RADIOLOGY:  Dg Forearm Left  Result  Date: 12/02/2017 CLINICAL DATA:  Followup ORIF. EXAM: LEFT FOREARM - 2 VIEW COMPARISON:  11/22/2017 FINDINGS: There is now complete displacement at the previously nondisplaced distal ulnar diaphyseal fracture. The bone is displaced medially by the with of the bone and dorsally by half the width of the bone. Hardware previously placed for reduction and fixation of the displaced distal radial fracture remains in place, without convincing change in position or alignment  based on the views provided. IMPRESSION: Previous ORIF of distal radial fracture without discernible change. There is now complete displacement of the previously nondisplaced distal ulnar diaphyseal fracture as described above. Electronically Signed   By: Paulina Fusi M.D.   On: 12/02/2017 14:05   ASSESSMENT AND PLAN:  Sylvia [name]  is a 32 y.o. female with a known history of cerebral palsy, schizoaffective schizophrenia, substance abuse, seizures, CVA in the past, alcohol abuse currently in the emergency room for overdose with multiple medications.  Patient took a large dose of multiple meds.  According to family member patient has been intoxicated and took a full 7-day supply followed medications last night and made suicidal statements.  Patient has been involuntarily committed by ER physician   *Altered mental status secondary to medication overdose Monitor electrolytes and mental status--mentation back to baseline  *Suicidal ideation One-on-one observation for patient safety Appreciate psychiatry consultation--holding psych meds till evaluated by Dr clapacs today  *Alcohol intoxication CIWA protocol Thiamine and folic acid supplements  *leftDisplaced ulnar fracture Patient removed the cast previously intact aligned bone fragments have been displaced Orthopedic surgery consultation--with Dr Real Cons -patient has swelling. She has sutures from previous surgery which looks stable. There is no redness erythema or increased temperature on the left hand. -No evidence of cellulitis. She received one dose of clindamycin in the ER. I will hold off on further antibiotics unless orthopedic feel pt needs it  *DVT prophylaxis subcu Lovenox daily  *Schizoaffective schizophrenia Psychiatry follow-up   Case discussed with Care Management/Social Worker. Management plans discussed with the patient, family and they are in agreement.  CODE STATUS: full  DVT Prophylaxis: lovenox  TOTAL TIME  TAKING CARE OF THIS PATIENT: *30* minutes.  >50% time spent on counselling and coordination of care  POSSIBLE D/C IN 1-2* DAYS, DEPENDING ON CLINICAL CONDITION.  Note: This dictation was prepared with Dragon dictation along with smaller phrase technology. Any transcriptional errors that result from this process are unintentional.  Enedina Finner M.D on 12/03/2017 at 8:52 AM  Between 7am to 6pm - Pager - [phone]  After 6pm go to [email or web address] - Social research officer, government  [facility]  Office  [phone]  CC: Primary care physician; Jeanice Lim, PA-CPatient ID: Chauncey Reading, female   DOB: [birth year], 32 y.o.   MRN: [id number]

## 2017-12-04 ENCOUNTER — Encounter: Payer: Self-pay | Admitting: Anesthesiology

## 2017-12-04 ENCOUNTER — Inpatient Hospital Stay: Payer: Medicare Other | Admitting: Registered Nurse

## 2017-12-04 ENCOUNTER — Encounter
Admission: EM | Disposition: A | Discharge status: Other Acute Inpatient Hospital | Payer: Self-pay | Source: Home / Self Care | Attending: Internal Medicine

## 2017-12-04 ENCOUNTER — Inpatient Hospital Stay: Payer: Medicare Other

## 2017-12-04 HISTORY — PX: ORIF ULNAR FRACTURE: SHX5417

## 2017-12-04 LAB — POCT PREGNANCY, URINE: PREG TEST UR: NEGATIVE

## 2017-12-04 SURGERY — OPEN REDUCTION INTERNAL FIXATION (ORIF) ULNAR FRACTURE
Anesthesia: General | Laterality: Left

## 2017-12-04 MED ORDER — ONDANSETRON HCL 4 MG/2ML IJ SOLN
INTRAMUSCULAR | Status: AC
  Filled 2017-12-04: qty 2

## 2017-12-04 MED ORDER — POTASSIUM CHLORIDE IN NACL 20-0.9 MEQ/L-% IV SOLN
INTRAVENOUS | Status: DC
  Administered 2017-12-04: 18:00:00 via INTRAVENOUS
  Filled 2017-12-04 (×4): qty 1000

## 2017-12-04 MED ORDER — DEXAMETHASONE SODIUM PHOSPHATE 10 MG/ML IJ SOLN
INTRAMUSCULAR | Status: AC
  Filled 2017-12-04: qty 1

## 2017-12-04 MED ORDER — OXYCODONE HCL 5 MG PO TABS: 10.0000 mg | ORAL_TABLET | ORAL | Status: DC | PRN

## 2017-12-04 MED ORDER — MENTHOL 3 MG MT LOZG
1.0000 | LOZENGE | OROMUCOSAL | Status: DC | PRN
  Filled 2017-12-04: qty 9

## 2017-12-04 MED ORDER — LIDOCAINE HCL (PF) 2 % IJ SOLN
INTRAMUSCULAR | Status: AC
  Filled 2017-12-04: qty 10

## 2017-12-04 MED ORDER — HYDROMORPHONE HCL 1 MG/ML IJ SOLN: 0.5000 mg | INTRAMUSCULAR | Status: DC | PRN

## 2017-12-04 MED ORDER — ONDANSETRON HCL 4 MG/2ML IJ SOLN
INTRAMUSCULAR | Status: DC | PRN
  Administered 2017-12-04: 4 mg via INTRAVENOUS

## 2017-12-04 MED ORDER — FENTANYL CITRATE (PF) 100 MCG/2ML IJ SOLN
INTRAMUSCULAR | Status: AC
  Filled 2017-12-04: qty 2

## 2017-12-04 MED ORDER — ONDANSETRON HCL 4 MG/2ML IJ SOLN: 4.0000 mg | Freq: Once | INTRAMUSCULAR | Status: DC | PRN

## 2017-12-04 MED ORDER — LACTATED RINGERS IV SOLN
INTRAVENOUS | Status: DC
  Administered 2017-12-04: 12:00:00 via INTRAVENOUS

## 2017-12-04 MED ORDER — MAGNESIUM CITRATE PO SOLN
1.0000 | Freq: Once | ORAL | Status: DC | PRN
  Filled 2017-12-04: qty 296

## 2017-12-04 MED ORDER — ACETAMINOPHEN 500 MG PO TABS
1000.0000 mg | ORAL_TABLET | Freq: Four times a day (QID) | ORAL | Status: AC
  Administered 2017-12-05: 1000 mg via ORAL
  Filled 2017-12-04 (×3): qty 2

## 2017-12-04 MED ORDER — NEOMYCIN-POLYMYXIN B GU 40-200000 IR SOLN
Status: DC | PRN
  Administered 2017-12-04: 4 mL

## 2017-12-04 MED ORDER — METHOCARBAMOL 1000 MG/10ML IJ SOLN
500.0000 mg | Freq: Four times a day (QID) | INTRAVENOUS | Status: DC | PRN
  Filled 2017-12-04: qty 5

## 2017-12-04 MED ORDER — LORAZEPAM 2 MG/ML IJ SOLN
2.0000 mg | Freq: Once | INTRAMUSCULAR | Status: AC
  Administered 2017-12-04: 2 mg via INTRAMUSCULAR
  Filled 2017-12-04: qty 1

## 2017-12-04 MED ORDER — ONDANSETRON HCL 4 MG/2ML IJ SOLN: 4.0000 mg | Freq: Four times a day (QID) | INTRAMUSCULAR | Status: DC | PRN

## 2017-12-04 MED ORDER — ONDANSETRON HCL 4 MG PO TABS: 4.0000 mg | ORAL_TABLET | Freq: Four times a day (QID) | ORAL | Status: DC | PRN

## 2017-12-04 MED ORDER — KETOROLAC TROMETHAMINE 15 MG/ML IJ SOLN
7.5000 mg | Freq: Four times a day (QID) | INTRAMUSCULAR | Status: AC
  Administered 2017-12-04 – 2017-12-05 (×3): 7.5 mg via INTRAVENOUS
  Filled 2017-12-04 (×3): qty 1

## 2017-12-04 MED ORDER — POLYETHYLENE GLYCOL 3350 17 G PO PACK: 17.0000 g | PACK | Freq: Every day | ORAL | Status: DC | PRN

## 2017-12-04 MED ORDER — ENOXAPARIN SODIUM 40 MG/0.4ML ~~LOC~~ SOLN
40.0000 mg | SUBCUTANEOUS | Status: DC
  Administered 2017-12-05: 40 mg via SUBCUTANEOUS
  Filled 2017-12-04: qty 0.4

## 2017-12-04 MED ORDER — SUCCINYLCHOLINE CHLORIDE 20 MG/ML IJ SOLN
INTRAMUSCULAR | Status: DC | PRN
  Administered 2017-12-04: 80 mg via INTRAVENOUS

## 2017-12-04 MED ORDER — PROPOFOL 10 MG/ML IV BOLUS
INTRAVENOUS | Status: DC | PRN
  Administered 2017-12-04: 150 mg via INTRAVENOUS

## 2017-12-04 MED ORDER — FENTANYL CITRATE (PF) 100 MCG/2ML IJ SOLN: 25.0000 ug | INTRAMUSCULAR | Status: DC | PRN

## 2017-12-04 MED ORDER — ACETAMINOPHEN 10 MG/ML IV SOLN
INTRAVENOUS | Status: AC
  Filled 2017-12-04: qty 100

## 2017-12-04 MED ORDER — FENTANYL CITRATE (PF) 100 MCG/2ML IJ SOLN
INTRAMUSCULAR | Status: DC | PRN
  Administered 2017-12-04: 50 ug via INTRAVENOUS
  Administered 2017-12-04 (×2): 25 ug via INTRAVENOUS

## 2017-12-04 MED ORDER — MIDAZOLAM HCL 2 MG/2ML IJ SOLN
INTRAMUSCULAR | Status: AC
  Filled 2017-12-04: qty 2

## 2017-12-04 MED ORDER — BUPIVACAINE HCL (PF) 0.5 % IJ SOLN
INTRAMUSCULAR | Status: DC | PRN
  Administered 2017-12-04: 10 mL

## 2017-12-04 MED ORDER — BISACODYL 10 MG RE SUPP: 10.0000 mg | Freq: Every day | RECTAL | Status: DC | PRN

## 2017-12-04 MED ORDER — ALUM & MAG HYDROXIDE-SIMETH 200-200-20 MG/5ML PO SUSP: 30.0000 mL | ORAL | Status: DC | PRN

## 2017-12-04 MED ORDER — ACETAMINOPHEN 325 MG PO TABS: 325.0000 mg | ORAL_TABLET | Freq: Four times a day (QID) | ORAL | Status: DC | PRN

## 2017-12-04 MED ORDER — METHOCARBAMOL 500 MG PO TABS
500.0000 mg | ORAL_TABLET | Freq: Four times a day (QID) | ORAL | Status: DC | PRN
  Administered 2017-12-05: 500 mg via ORAL
  Filled 2017-12-04: qty 1

## 2017-12-04 MED ORDER — CEFAZOLIN SODIUM-DEXTROSE 2-4 GM/100ML-% IV SOLN
INTRAVENOUS | Status: AC
  Filled 2017-12-04: qty 100

## 2017-12-04 MED ORDER — OXYCODONE HCL 5 MG PO TABS
5.0000 mg | ORAL_TABLET | ORAL | Status: DC | PRN
  Administered 2017-12-04 (×2): 5 mg via ORAL
  Administered 2017-12-05: 10 mg via ORAL
  Administered 2017-12-05 (×3): 5 mg via ORAL
  Filled 2017-12-04 (×4): qty 1
  Filled 2017-12-04: qty 2
  Filled 2017-12-04: qty 1

## 2017-12-04 MED ORDER — LIDOCAINE HCL (CARDIAC) PF 100 MG/5ML IV SOSY
PREFILLED_SYRINGE | INTRAVENOUS | Status: DC | PRN
  Administered 2017-12-04: 80 mg via INTRAVENOUS

## 2017-12-04 MED ORDER — DEXAMETHASONE SODIUM PHOSPHATE 10 MG/ML IJ SOLN
INTRAMUSCULAR | Status: DC | PRN
  Administered 2017-12-04: 10 mg via INTRAVENOUS

## 2017-12-04 MED ORDER — DOCUSATE SODIUM 100 MG PO CAPS
100.0000 mg | ORAL_CAPSULE | Freq: Two times a day (BID) | ORAL | Status: DC
  Administered 2017-12-05: 100 mg via ORAL
  Filled 2017-12-04 (×2): qty 1

## 2017-12-04 MED ORDER — CEFAZOLIN SODIUM-DEXTROSE 1-4 GM/50ML-% IV SOLN
1.0000 g | Freq: Four times a day (QID) | INTRAVENOUS | Status: AC
  Administered 2017-12-04 – 2017-12-05 (×2): 1 g via INTRAVENOUS
  Filled 2017-12-04 (×2): qty 50

## 2017-12-04 MED ORDER — PROPOFOL 10 MG/ML IV BOLUS
INTRAVENOUS | Status: AC
  Filled 2017-12-04: qty 20

## 2017-12-04 MED ORDER — PHENOL 1.4 % MT LIQD
1.0000 | OROMUCOSAL | Status: DC | PRN
  Filled 2017-12-04: qty 177

## 2017-12-04 MED ORDER — ACETAMINOPHEN 10 MG/ML IV SOLN
INTRAVENOUS | Status: DC | PRN
  Administered 2017-12-04: 1000 mg via INTRAVENOUS

## 2017-12-04 SURGICAL SUPPLY — 44 items
BIT DRILL 2.5X110 QC LCP DISP (BIT) ×2 IMPLANT
BNDG COHESIVE 4X5 TAN STRL (GAUZE/BANDAGES/DRESSINGS) ×3 IMPLANT
BNDG ESMARK 4X12 TAN STRL LF (GAUZE/BANDAGES/DRESSINGS) ×3 IMPLANT
CANISTER SUCT 1200ML W/VALVE (MISCELLANEOUS) ×3 IMPLANT
CLOSURE WOUND 1/2 X4 (GAUZE/BANDAGES/DRESSINGS) ×2
COVER WAND RF STERILE (DRAPES) ×3 IMPLANT
CUFF TOURN 18 STER (MISCELLANEOUS) ×2 IMPLANT
CUFF TOURN 24 STER (MISCELLANEOUS) IMPLANT
DRAPE FLUOR MINI C-ARM 54X84 (DRAPES) ×3 IMPLANT
DURAPREP 26ML APPLICATOR (WOUND CARE) ×9 IMPLANT
ELECT REM PT RETURN 9FT ADLT (ELECTROSURGICAL) ×3
ELECTRODE REM PT RTRN 9FT ADLT (ELECTROSURGICAL) ×1 IMPLANT
GAUZE PETRO XEROFOAM 1X8 (MISCELLANEOUS) ×3 IMPLANT
GAUZE SPONGE 4X4 12PLY STRL (GAUZE/BANDAGES/DRESSINGS) ×3 IMPLANT
GLOVE BIOGEL PI IND STRL 9 (GLOVE) ×1 IMPLANT
GLOVE BIOGEL PI INDICATOR 9 (GLOVE) ×2
GLOVE SURG 9.0 ORTHO LTXF (GLOVE) ×6 IMPLANT
GOWN STRL REUS W/ TWL LRG LVL3 (GOWN DISPOSABLE) ×1 IMPLANT
GOWN STRL REUS W/TWL 2XL LVL3 (GOWN DISPOSABLE) ×3 IMPLANT
GOWN STRL REUS W/TWL LRG LVL3 (GOWN DISPOSABLE) ×3
HANDLE YANKAUER SUCT BULB TIP (MISCELLANEOUS) ×3 IMPLANT
KIT TURNOVER KIT A (KITS) ×3 IMPLANT
NS IRRIG 1000ML POUR BTL (IV SOLUTION) ×3 IMPLANT
PACK EXTREMITY ARMC (MISCELLANEOUS) ×3 IMPLANT
PAD ABD DERMACEA PRESS 5X9 (GAUZE/BANDAGES/DRESSINGS) ×6 IMPLANT
PAD CAST CTTN 4X4 STRL (SOFTGOODS) ×2 IMPLANT
PADDING CAST COTTON 4X4 STRL (SOFTGOODS) ×6
PLATE LCP RECON 3.5 5H/70 (Plate) ×2 IMPLANT
SCREW CORTEX 3.5 12MM (Screw) ×2 IMPLANT
SCREW CORTEX 3.5 14MM (Screw) ×8 IMPLANT
SCREW CORTEX 3.5 16MM (Screw) ×2 IMPLANT
SCREW LOCK CORT ST 3.5X12 (Screw) IMPLANT
SCREW LOCK CORT ST 3.5X14 (Screw) IMPLANT
SCREW LOCK CORT ST 3.5X16 (Screw) IMPLANT
SLING ARM M TX990204 (SOFTGOODS) ×2 IMPLANT
SPLINT CAST 1 STEP 4X15 (MISCELLANEOUS) ×4 IMPLANT
SPONGE LAP 18X18 RF (DISPOSABLE) ×3 IMPLANT
STAPLER SKIN PROX 35W (STAPLE) ×3 IMPLANT
STOCKINETTE BIAS CUT 4 980044 (GAUZE/BANDAGES/DRESSINGS) ×3 IMPLANT
STOCKINETTE STRL 6IN 960660 (GAUZE/BANDAGES/DRESSINGS) ×3 IMPLANT
STRIP CLOSURE SKIN 1/2X4 (GAUZE/BANDAGES/DRESSINGS) ×2 IMPLANT
SUT VIC AB 0 CT1 36 (SUTURE) ×2 IMPLANT
SUT VIC AB 2-0 CT1 36 (SUTURE) ×3 IMPLANT
SUT VICRYL+ 3-0 36IN CT-1 (SUTURE) ×3 IMPLANT

## 2017-12-04 NOTE — Progress Notes (Signed)
[facility] [place] at [facility]   PATIENT NAME: Sylvia [name]    MR#:  [id number]  DATE OF BIRTH:  December 17, 1985  SUBJECTIVE:  CHIEF COMPLAINT:   Chief Complaint  Patient presents with  . Psychiatric Evaluation   -Involuntary commitment discontinued.  Going for ulnar surgery today. -Poor judgment.  REVIEW OF SYSTEMS:  Review of Systems  Constitutional: Negative for chills and fever.  HENT: Negative for congestion, ear discharge, hearing loss and nosebleeds.   Eyes: Negative for blurred vision and double vision.  Respiratory: Negative for cough, shortness of breath and wheezing.   Cardiovascular: Negative for chest pain and palpitations.  Gastrointestinal: Negative for abdominal pain, constipation, diarrhea, nausea and vomiting.  Genitourinary: Negative for dysuria.  Musculoskeletal: Positive for joint pain and myalgias.  Neurological: Negative for dizziness, speech change, focal weakness, seizures, weakness and headaches.  Psychiatric/Behavioral: Negative for depression.    DRUG ALLERGIES:   Allergies  Allergen Reactions  . Diphenhydramine Hcl Rash    Reaction:  Lowers Dilantin level   . Fluoxetine Other (See Comments)    Reaction:  Altered mental status   . Baclofen Other (See Comments)    Drops blood pressure, per pt  . Depakote [Divalproex Sodium] Other (See Comments)    Reaction:  Seizures and drops pts BP  . Quetiapine Other (See Comments)    Reaction:  Drops pts BP    VITALS:  Blood pressure 113/82, pulse 67, temperature (!) 97.5 F (36.4 C), temperature source Temporal, resp. rate 11, SpO2 100 %.  PHYSICAL EXAMINATION:  Physical Exam  GENERAL:  32 y.o.-year-old patient lying in the bed with no acute distress.  EYES: Pupils equal, round, reactive to light and accommodation. No scleral icterus. Extraocular muscles intact.  HEENT: Head atraumatic, normocephalic. Oropharynx and nasopharynx clear.  NECK:  Supple, no jugular venous  distention. No thyroid enlargement, no tenderness.  LUNGS: Normal breath sounds bilaterally, no wheezing, rales,rhonchi or crepitation. No use of accessory muscles of respiration.  CARDIOVASCULAR: S1, S2 normal. No murmurs, rubs, or gallops.  ABDOMEN: Soft, nontender, nondistended. Bowel sounds present. No organomegaly or mass.  EXTREMITIES: left hand s/p surgery. No pedal edema, cyanosis, or clubbing.  NEUROLOGIC: Cranial nerves II through XII are intact. Muscle strength 5/5 in all extremities. Sensation intact. Gait not checked.  PSYCHIATRIC: The patient is alert and oriented x 3.  SKIN: No obvious rash, lesion, or ulcer.    LABORATORY PANEL:   CBC Recent Labs  Lab 12/03/17 0330  WBC 5.9  HGB 10.3*  HCT 32.2*  PLT 220   ------------------------------------------------------------------------------------------------------------------  Chemistries  Recent Labs  Lab 12/02/17 1420 12/03/17 0330  NA 139 138  K 3.9 3.6  CL 106 109  CO2 22 24  GLUCOSE 95 129*  BUN 5* 6  CREATININE 0.51 0.65  CALCIUM 8.5* 7.9*  MG 2.3  --   AST 34  --   ALT 21  --   ALKPHOS 65  --   BILITOT 0.3  --    ------------------------------------------------------------------------------------------------------------------  Cardiac Enzymes No results for input(s): TROPONINI in the last 168 hours. ------------------------------------------------------------------------------------------------------------------  RADIOLOGY:  No results found.  EKG:   Orders placed or performed during the hospital encounter of 12/02/17  . ED EKG  . ED EKG    ASSESSMENT AND PLAN:   Sylvia [name]  is a 32 y.o. female with a known history of polysubstance abuse, alcohol abuse, cerebral palsy, hep C due to use of IV drugs in the past, schizophrenia,  depression and anxiety presents to hospital after drug overdose and left hand ulnar fracture.    1.  Left hand nondisplaced distal ulnar fracture-appreciate  orthopedics consult and patient had ORIF done today. -Postop pain control and physical therapy recommended. -Management per orthopedic surgeon   2.  Drug overdose-appreciate psychiatry consult.  Patient was placed on involuntary commitment  -Patient is now awake and alert and denied any overdose of pills.  Cleared by psychiatry.  Commitment has been removed.  She did cut the cast off her arm from last discharge saying it was uncomfortable.  Has been drinking heavily. -No indication for inpatient psychiatric treatment according to Dr. Toni Amend -Commended to continue her outpatient medications.  3.  Seizure disorder-continue Keppra.    4.  Depression and anxiety-again not taking any of her medicines for more than 7 months now -Continue to monitor.  5.  Polysubstance abuse-tox screen is negative, patient says he has not been doing cocaine lately.  But has been drinking heavily.  6.  Alcohol abuse disorder-monitor for any withdrawals.  Use Ativan as needed  7.  DVT prophylaxis-will be started after surgery     All the records are reviewed and case discussed with Care Management/Social Workerr. Management plans discussed with the patient, family and they are in agreement.  CODE STATUS: Full Code  TOTAL TIME TAKING CARE OF THIS PATIENT: 38 minutes.   POSSIBLE D/C IN 1-2 DAYS, DEPENDING ON CLINICAL CONDITION.   Enid Baas M.D on 12/04/2017 at 3:02 PM  Between 7am to 6pm - Pager - [phone]  After 6pm go to [email or web address] - Social research officer, government  [facility]  Office  [phone]  CC: Primary care physician; Haubert, Vernell Leep, PA-C

## 2017-12-04 NOTE — Op Note (Signed)
12/04/2017  2:44 PM  PATIENT:  Sylvia [name]    PRE-OPERATIVE DIAGNOSIS:  CLOSED LEFT DISTAL ULNA FRACTURE  POST-OPERATIVE DIAGNOSIS:  Same  PROCEDURE:  OPEN REDUCTION INTERNAL FIXATION (ORIF) LEFT DISTAL ULNAR FRACTURE  SURGEON:  , , MD  ANESTHESIA:   General  PREOPERATIVE INDICATIONS:  Sylvia [name] is a  32 y.o. female with a diagnosis of LEFT DISTAL ULNA FRACTURE who failed conservative measures and elected for surgical management.    I discussed the risks and benefits of surgery. The risks include but are not limited to infection, bleeding requiring blood transfusion, nerve or blood vessel injury, joint stiffness or loss of motion, persistent pain, weakness or instability, malunion, nonunion and hardware failure and the need for further surgery. Medical risks include but are not limited to DVT and pulmonary embolism, myocardial infarction, stroke, pneumonia, respiratory failure and death. Patient understood these risks and wished to proceed.    OPERATIVE IMPLANTS: Synthes 3.5 5 hole LCDC plate  OPERATIVE FINDINGS: transverse displaced fracture of the distal ulna  OPERATIVE PROCEDURE: Patient was met in the preoperative area.  I signed the left wrist according to the hospitals correct site of surgery protocol.  Patient was brought to the operating room.  She was placed supine on the operative table.  A tourniquet was applied to the left upper extremity.  Patient was prepped and draped in a sterile fashion.  A timeout was performed to verify the patient's name, date of birth, medical record number, correct site of surgery and correct procedure to be performed.  Once all in attendance were in agreement the case began.  Patient received 2 g of Ancef prior to the incision.  A approach was used to the ulna.  A linear incision in line with the ulna was made.  Subcutaneous tissue was then dissected with the Metzenbaum scissor and pickup.  Care was taken to avoid injury to  neurovascular structures.  The fracture was then identified.  Scar tissue was removed with a rondure and a curette.  A periosteal elevator was used to remove soft tissue from the lateral aspect of the ulna.  A 5 hole 3.5 LCDC plate was applied to the lateral ulna and held into position with 2 fracture clamps.  FluoroScan images were used to confirm fracture reduction and plate placement.  A single bicortical screw was placed distal and a second screw placed proximal to the fracture in a compressive fashion.  Fracture clamps were removed.  FluoroScan images were taken to confirm fracture reduction and proper plate placement.  A second 3.5 bicortical screw was placed distal to the fracture and 2 additional 3.5 cortical screws were placed proximal to the fracture.  Once all fracture holes were filled the wound was copiously irrigated.  Final FluoroScan images were taken.    The deep fascia was closed with 0 Vicryl in the subcutaneous tissue was closed with 3-0 Vicryl.  The skin was approximated with staples.  10 cc of quarter percent Marcaine plain was injected into the incision site for postoperative pain control.  Sutures from her previous distal radius ORIF and open carpal tunnel release were removed as her incision was well-healed.  Steri-Strips were applied over this incision and sutures were removed.  A dry sterile dressing was applied.  An AP splint was then placed over the dressing and wrapped with an Ace wrap.  Patient was then extubated and brought to the PACU in stable condition.  I was scrubbed and present for the entire   case.  All sharp, instrument, and punch counts were correct at the conclusion the case.

## 2017-12-04 NOTE — Treatment Plan (Signed)
PT  PLACED  UNDER  IVC  PAPERS  PER  DR CLAPACS  MD   

## 2017-12-04 NOTE — Anesthesia Post-op Follow-up Note (Signed)
Anesthesia QCDR form completed.        

## 2017-12-04 NOTE — Clinical Social Work Note (Signed)
Clinical Social Work Assessment  Patient Details  Name: Sylvia [name] MRN: [id number] Date of Birth: 09/01/1985  Date of referral:  12/04/17               Reason for consult:  Community Resources, Mental Health Concerns, Substance Use/ETOH Abuse                Permission sought to share information with:    Permission granted to share information::     Name::        Agency::     Relationship::     Contact Information:     Housing/Transportation Living arrangements for the past 2 months:  Apartment Source of Information:  Patient Patient Interpreter Needed:  None Criminal Activity/Legal Involvement Pertinent to Current Situation/Hospitalization:  No - Comment as needed Significant Relationships:  Other(Comment)(Aunt/ ACT Team ) Lives with:  Self Do you feel safe going back to the place where you live?  Yes Need for family participation in patient care:  Yes (Comment)  Care giving concerns:  Patient lives alone in an apartment in [place] and is followed by an ACT Team.    Social Worker assessment / plan:  Clinical Social Worker (CSW) reviewed chart and noted that Dr. Clapacs recommended inpatient psych admission on 12/02/17. On 12/03/17 Dr. Clapacs cancelled the IVC and cleared patient. Per Dr. Clapacs note on 12/03/17 patient does not need inpatient psych admission. CSW met with patient alone at bedside today to offer resources. Patient appeared angry and agitated. Patient was yelling and stated that "my damn ACT Team are liars and I was not trying to kill myself." Patient did not answer CSW questions and asked CSW to leave the room. Patient said all she wanted was her purse and keys and she does not know where they are at. Per patient she is going home right after she has surgery. CSW attempted to provide emotional support however continued to yell and ask for her stuff. CSW left patient a list of [place] resources at bedside including outpatient substance abuse resources.  Patient did not want to talk about anything else. RN aware of above. CSW will continue to follow and assist as needed.   Employment status:  Disabled (Comment on whether or not currently receiving Disability) Insurance information:  Medicare, Medicaid In State PT Recommendations:  Not assessed at this time Information / Referral to community resources:  Other (Comment Required)(Patient refused resources. )  Patient/Family's Response to care:  Patient appeared agitated and did not participate in assessment.   Patient/Family's Understanding of and Emotional Response to Diagnosis, Current Treatment, and Prognosis:  Patient appeared angry and wanted surgery to happen quickly. She stated that she wanted to D/C home.  Emotional Assessment Appearance:  Appears stated age Attitude/Demeanor/Rapport:  Aggressive (Verbally and/or physically), Angry Affect (typically observed):    Orientation:  Oriented to Self, Oriented to Place, Oriented to  Time, Oriented to Situation Alcohol / Substance use:  Alcohol Use Psych involvement (Current and /or in the community):  Yes (Comment)(Psych cleared patient )  Discharge Needs  Concerns to be addressed:  Mental Health Concerns Readmission within the last 30 days:  Yes Current discharge risk:  Substance Abuse Barriers to Discharge:  Continued Medical Work up   ,  M, LCSW 12/04/2017, 2:26 PM  

## 2017-12-04 NOTE — Progress Notes (Signed)
Pt frequently removes cardiac monitored and refuses to wear. Tele notified and placed patient on standby. MD paged. Patient alert and oriented and resting in bed.CIWA controlled with scheduled medication.

## 2017-12-04 NOTE — Progress Notes (Signed)
Pt returned from PACU about 3:30pm. Pt was very agitated and immediately started saying she was going home "to make sure my house is locked" and that she needs a cab voucher. Dr. Nemiah Commander notified and came to see pt. Fredric Mare with CSW left voicemail for Dr. Toni Amend, and he came to see pt. After discussion with Dr. Toni Amend and IM ativan, pt calmed down and more appropriate.

## 2017-12-04 NOTE — Progress Notes (Signed)
Clinical Social Worker (CSW) contacted Rene Kocher [phone] who placed patient under IVC in the community. Per Rene Kocher she is with patient's [employer] [facility]. Per Rene Kocher patient's guardian is her aunt Ander Slade. CSW found guardianship form scanned into chart from 2017. Per Melina Modena is hard to get in touch with because she is a Financial controller. Per Rene Kocher she has been calling [facility] since 12/02/17 and has not been able to get in touch with her. Patient's nurse has been calling [facility] all day today with no success. Rene Kocher believes that patient needs a new guardian. CSW also left a Engineer, technical sales for [employer]. Rene Kocher believes that patient needs an inpatient psych admission to get her medications stabilized. CSW also contacted Mikey Bussing [phone] patient's [employer] [facility] Coord[facility]. Per Mikey Bussing he believes that patient needs to go to an inpatient psych unit as well. Per Mikey Bussing patient's medications have not been stable since she was released from jail. Per Mikey Bussing his plan for patient is to return to her apartment in [place] until she can move into another apartment in [place]. Per Mikey Bussing he has found patient a 2 bedroom apartment in [place] that is closer to her support system. CSW made Dr. Toni Amend aware of above. Patient is under IVC now. CSW made an Adult Management consultant (APS) report in [facility] because patient's guardian has not called the ACT Team or hospital back. CSW will continue to follow and assist as needed.   [employer], LCSW [phone]

## 2017-12-04 NOTE — Consult Note (Signed)
[place] [facility] Consult   Reason for Consult: Follow-up consult for patient with alcohol abuse schizoaffective disorder and poor self-care Referring Physician: Tressia Miners Patient Identification: Sylvia [name] MRN:  [id number] Principal Diagnosis: Overdose Diagnosis:   Patient Active Problem List   Diagnosis Date Noted  . SIRS (systemic inflammatory response syndrome) ([place]) [R65.10] 12/02/2017  . Overdose [T50.901A] 12/02/2017  . Radius fracture [S52.90XA] 11/22/2017  . Substance induced mood disorder ([place]) [F19.94] 07/24/2016  . 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 Cocaine use disorder, moderate, dependence ([place]) [F14.20] 03/17/2015  . Cannabis use disorder, mild, abuse [F12.10] 03/17/2015  . Alcohol use disorder, severe, dependence ([place]) [F10.20] 03/17/2015  . Seizure disorder ([place]) [T36.468] 09/17/2013  . Cellulitis and abscess of toe of right foot [L03.031, L02.611] 09/16/2013  . Hepatitis C [B19.20]   . CP (cerebral palsy) ([place]) [G80.9]     Total Time spent with patient: 30 minutes  Subjective:   Stephanieann E Fulcher is a 32 y.o. female patient admitted with "I got ago".  HPI: Patient seen chart reviewed.  Case reviewed with nursing and social work in Environmental health practitioner.  Patient had her arm recasted today and immediately was demanding that she be released from the hospital.  Behavior was agitated and disorganized and inappropriate.  Demanding to be released but having no way to get out of here no access to her clothes no idea who would help her with anything.  Affect was very angry.  Trying to speak with her she was constantly interrupting with pressured speech and no ability to show that she understood her situation.  Past Psychiatric History: Long history of problem behavior with alcohol abuse possible schizoaffective disorder and intellectual disability  Risk to Self:   Risk to Others:   Prior Inpatient Therapy:   Prior Outpatient Therapy:    Past  Medical History:  Past Medical History:  Diagnosis Date  . Alcoholism ([place])   . CP (cerebral palsy) ([place])   . Hepatitis C   . Herpes   . Polysubstance abuse ([place])   . Schizoaffective schizophrenia ([place]) 09/17/2013   Per notes from [facility] as on 2014   . Seizures ([place])   . Stroke [facility])     Past Surgical History:  Procedure Laterality Date  . 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 FOOT SURGERY     rt and left  . LEG SURGERY Right    x7  . OPEN REDUCTION INTERNAL FIXATION (ORIF) DISTAL RADIAL FRACTURE Left 11/22/2017   Procedure: OPEN REDUCTION INTERNAL FIXATION (ORIF) DISTAL RADIAL FRACTURE and carpal tunnel release;  Surgeon: Thornton Park, MD;  Location: [facility] ORS;  Service: Orthopedics;  Laterality: Left;   Family History:  Family History  Problem Relation Age of Onset  . Bipolar disorder Mother   . Cancer Maternal Grandmother   . Diabetes Maternal Grandfather   . Hypertension Maternal Grandfather    Family Psychiatric  History: See previous note Social History:  Social History   Substance and Sexual Activity  Alcohol Use Yes  . Alcohol/week: 3.0 standard drinks  . Types: 3 Cans of beer per week   Comment: Per patient 0.5 gallon to gallon when she can but since being in group home she can't do it as often      Social History   Substance and Sexual Activity  Drug Use Yes  . Types: "Crack" cocaine,  Marijuana, Cocaine   Comment: Per patient uses herion and crack and cocaine "once in a blue moon"    Social History   Socioeconomic History  . Marital status: Single    Spouse name: Not on file  . Number of children: Not on file  . Years of education: Not on file  . Highest education level: Not on file  Occupational History  . Not on file  Social Needs  . Financial resource strain: Not on file  . Food insecurity:    Worry: Not  on file    Inability: Not on file  . Transportation needs:    Medical: Not on file    Non-medical: Not on file  Tobacco Use  . Smoking status: Current Every Day Smoker    Packs/day: 1.00    Years: 20.00    Pack years: 20.00    Types: Cigarettes  . Smokeless tobacco: Former Network engineer and Sexual Activity  . Alcohol use: Yes    Alcohol/week: 3.0 standard drinks    Types: 3 Cans of beer per week    Comment: Per patient 0.5 gallon to gallon when she can but since being in group home she can't do it as often   . Drug use: Yes    Types: "Crack" cocaine, Marijuana, Cocaine    Comment: Per patient uses herion and crack and cocaine "once in a blue moon"  . Sexual activity: Not Currently    Birth control/protection: Implant  Lifestyle  . Physical activity:    Days per week: Not on file    Minutes per session: Not on file  . Stress: Not on file  Relationships  . Social connections:    Talks on phone: Not on file    Gets together: Not on file    Attends religious service: Not on file    Active member of club or organization: Not on file    Attends meetings of clubs or organizations: Not on file    Relationship status: Not on file  Other Topics Concern  . Not on file  Social History Narrative   Lives at home by herself, only has acquaintances here   Does not work    Additional Social History:    Allergies:   Allergies  Allergen Reactions  . Diphenhydramine Hcl Rash    Reaction:  Lowers Dilantin level   . Fluoxetine Other (See Comments)    Reaction:  Altered mental status   . Baclofen Other (See Comments)    Drops blood pressure, per pt  . Depakote [Divalproex Sodium] Other (See Comments)    Reaction:  Seizures and drops pts BP  . Quetiapine Other (See Comments)    Reaction:  Drops pts BP    Labs:  Results for orders placed or performed during the hospital encounter of 12/02/17 (from the past 48 hour(s))  Blood culture (routine x 2)     Status: None (Preliminary  result)   Collection Time: 12/02/17 11:04 PM  Result Value Ref Range   Specimen Description BLOOD RIGHT HAND    Special Requests      BOTTLES DRAWN AEROBIC ONLY Blood Culture adequate volume   Culture      NO GROWTH 2 DAYS Performed at [facility], [address] [place], [place] [address]    Report Status PENDING   Acetaminophen level     Status: Abnormal   Collection Time: 12/02/17 11:54 PM  Result Value Ref Range   Acetaminophen (Tylenol), Serum <10 (L)  10 - 30 ug/mL    Comment: (NOTE) Therapeutic concentrations vary significantly. A range of 10-30 ug/mL  may be an effective concentration for many patients. However, some  are best treated at concentrations outside of this range. Acetaminophen concentrations >150 ug/mL at 4 hours after ingestion  and >50 ug/mL at 12 hours after ingestion are often associated with  toxic reactions. Performed at [facility], [address] [place], [place] [address]   Lactic acid, plasma     Status: None   Collection Time: 12/02/17 11:54 PM  Result Value Ref Range   Lactic Acid, Venous 1.9 0.5 - 1.9 mmol/L    Comment: Performed at [facility], [address] [place], [place] [address]  Basic metabolic panel     Status: Abnormal   Collection Time: 12/03/17  3:30 AM  Result Value Ref Range   Sodium 138 135 - 145 mmol/L   Potassium 3.6 3.5 - 5.1 mmol/L   Chloride 109 98 - 111 mmol/L   CO2 24 22 - 32 mmol/L   Glucose, Bld 129 (H) 70 - 99 mg/dL   BUN 6 6 - 20 mg/dL   Creatinine, Ser 0.65 0.44 - 1.00 mg/dL   Calcium 7.9 (L) 8.9 - 10.3 mg/dL   GFR calc non Af Amer >60 >60 mL/min   GFR calc Af Amer >60 >60 mL/min    Comment: (NOTE) The eGFR has been calculated using the CKD EPI equation. This calculation has not been validated in all clinical situations. eGFR's persistently <60 mL/min signify possible Chronic Kidney Disease.    Anion gap 5 5 - 15    Comment: Performed at [facility], [address] [place], [place] [address]  CBC     Status: Abnormal   Collection Time: 12/03/17  3:30 AM  Result Value Ref Range   WBC 5.9 4.0 - 10.5 K/uL   RBC 3.19 (L) 3.87 - 5.11 MIL/uL   Hemoglobin 10.3 (L) 12.0 - 15.0 g/dL   HCT 32.2 (L) 36.0 - 46.0 %   MCV 100.9 (H) 80.0 - 100.0 fL   MCH 32.3 26.0 - 34.0 pg   MCHC 32.0 30.0 - 36.0 g/dL   RDW 13.2 11.5 - 15.5 %   Platelets 220 150 - 400 K/uL   nRBC 0.0 0.0 - 0.2 %    Comment: Performed at [facility], [address] [place], [place] [address]  Pregnancy, urine POC     Status: None   Collection Time: 12/04/17 11:54 AM  Result Value Ref Range   Preg Test, Ur NEGATIVE NEGATIVE    Comment:        THE SENSITIVITY OF THIS METHODOLOGY IS >24 mIU/mL     Current Facility-Administered Medications  Medication Dose Route Frequency Provider Last Rate Last Dose  . 0.9 % NaCl with KCl 20 mEq/ L  infusion   Intravenous Continuous Thornton Park, MD 75 mL/hr at 12/04/17 7619    . acetaminophen (TYLENOL) tablet 1,000 mg  1,000 mg Oral Q6H Thornton Park, MD      . Derrill Memo ON 12/05/2017] acetaminophen (TYLENOL) tablet 325-650 mg  325-650 mg Oral Q6H PRN Thornton Park, MD      . alum & mag hydroxide-simeth (MAALOX/MYLANTA) 200-200-20 MG/5ML suspension 30 mL  30 mL Oral Q4H PRN Thornton Park, MD      . benztropine (COGENTIN) tablet 0.5 mg  0.5 mg Oral QHS Thornton Park, MD   0.5 mg at 12/03/17 2101  . bisacodyl (DULCOLAX)  suppository 10 mg  10 mg Rectal Daily PRN Thornton Park, MD      . ceFAZolin (ANCEF) IVPB 1 g/50 mL premix  1 g Intravenous Q6H Thornton Park, MD 100 mL/hr at 12/04/17 1826 1 g at 12/04/17 1826  . cholecalciferol (VITAMIN D) tablet 1,000 Units  1,000 Units Oral Daily Thornton Park, MD   1,000 Units at 12/03/17 [phone]  . docusate sodium (COLACE) capsule 100 mg  100 mg Oral BID Thornton Park, MD      . Derrill Memo ON 12/05/2017] enoxaparin (LOVENOX) injection 40 mg  40 mg Subcutaneous Q24H Thornton Park, MD       . folic acid (FOLVITE) tablet 1 mg  1 mg Oral Daily Thornton Park, MD   1 mg at 12/03/17 0837  . gabapentin (NEURONTIN) capsule 300 mg  300 mg Oral BID Thornton Park, MD   300 mg at 12/03/17 2101  . HYDROmorphone (DILAUDID) injection 0.5-1 mg  0.5-1 mg Intravenous Q4H PRN Thornton Park, MD      . hydrOXYzine (ATARAX/VISTARIL) tablet 50 mg  50 mg Oral TID Thornton Park, MD   50 mg at 12/03/17 2101  . ketorolac (TORADOL) 15 MG/ML injection 7.5 mg  7.5 mg Intravenous Q6H Thornton Park, MD   7.5 mg at 12/04/17 1817  . lactated ringers infusion   Intravenous Continuous Thornton Park, MD   Stopped at 12/04/17 1432  . levETIRAcetam (KEPPRA) tablet 250 mg  250 mg Oral BID Thornton Park, MD   250 mg at 12/04/17 0951  . LORazepam (ATIVAN) tablet 1 mg  1 mg Oral Q6H PRN Thornton Park, MD   1 mg at 12/04/17 0951   Or  . LORazepam (ATIVAN) injection 1 mg  1 mg Intravenous Q6H PRN Thornton Park, MD      . LORazepam (ATIVAN) tablet 0-4 mg  0-4 mg Oral Q12H Thornton Park, MD      . magnesium citrate solution 1 Bottle  1 Bottle Oral Once PRN Thornton Park, MD      . menthol-cetylpyridinium (CEPACOL) lozenge 3 mg  1 lozenge Oral PRN Thornton Park, MD       Or  . phenol (CHLORASEPTIC) mouth spray 1 spray  1 spray Mouth/Throat PRN Thornton Park, MD      . methocarbamol (ROBAXIN) tablet 500 mg  500 mg Oral Q6H PRN Thornton Park, MD       Or  . methocarbamol (ROBAXIN) 500 mg in dextrose 5 % 50 mL IVPB  500 mg Intravenous Q6H PRN Thornton Park, MD      . multivitamin with minerals tablet 1 tablet  1 tablet Oral Daily Thornton Park, MD   1 tablet at 12/03/17 [phone]  . nicotine (NICODERM CQ - dosed in mg/24 hours) patch 21 mg  21 mg Transdermal Daily Thornton Park, MD   21 mg at 12/04/17 0937  . ondansetron (ZOFRAN) tablet 4 mg  4 mg Oral Q6H PRN Thornton Park, MD       Or  . ondansetron [facility]) injection 4 mg  4 mg Intravenous Q6H PRN Thornton Park, MD      .  oxyCODONE (Oxy IR/ROXICODONE) immediate release tablet 10-15 mg  10-15 mg Oral Q4H PRN Thornton Park, MD      . oxyCODONE (Oxy IR/ROXICODONE) immediate release tablet 5-10 mg  5-10 mg Oral Q4H PRN Thornton Park, MD   5 mg at 12/04/17 1829  . polyethylene glycol (MIRALAX / GLYCOLAX) packet 17 g  17 g Oral Daily PRN Thornton Park, MD      .  thiamine (VITAMIN B-1) tablet 100 mg  100 mg Oral Daily Thornton Park, MD   100 mg at 12/03/17 8250   Or  . thiamine (B-1) injection 100 mg  100 mg Intravenous Daily Thornton Park, MD   100 mg at 12/04/17 5397    Musculoskeletal: Strength & Muscle Tone: within normal limits Gait & Station: normal Patient leans: N/A  Psychiatric Specialty Exam: Physical Exam  Constitutional: She appears well-developed and well-nourished.  HENT:  Head: Normocephalic and atraumatic.  Eyes: Pupils are equal, round, and reactive to light. Conjunctivae are normal.  Neck: Normal range of motion.  Cardiovascular: Normal heart sounds.  Respiratory: Effort normal.  GI: Soft.  Musculoskeletal: Normal range of motion.       Arms: Neurological: She is alert.  Skin: Skin is warm and dry.  Psychiatric: Her affect is angry, labile and inappropriate. Her speech is rapid and/or pressured and tangential. She is agitated. Cognition and memory are impaired. She expresses impulsivity and inappropriate judgment. She expresses no suicidal ideation.    Review of Systems  Constitutional: Negative.   HENT: Negative.   Eyes: Negative.   Respiratory: Negative.   Cardiovascular: Negative.   Gastrointestinal: Negative.   Musculoskeletal: Positive for joint pain.  Skin: Negative.   Neurological: Negative.   Psychiatric/Behavioral: Positive for memory loss and substance abuse. Negative for depression, hallucinations and suicidal ideas. The patient is nervous/anxious. The patient does not have insomnia.     Blood pressure (!) 110/97, pulse 96, temperature (!) 97.5 F (36.4  C), temperature source Temporal, resp. rate 11, SpO2 100 %.There is no height or weight on file to calculate BMI.  General Appearance: Disheveled  Eye Contact:  Fair  Speech:  Pressured  Volume:  Increased  Mood:  Irritable  Affect:  Inappropriate  Thought Process:  Disorganized  Orientation:  Negative  Thought Content:  Illogical, Rumination and Tangential  Suicidal Thoughts:  No  Homicidal Thoughts:  No  Memory:  Immediate;   Fair Recent;   Poor Remote;   Poor  Judgement:  Poor  Insight:  Lacking  Psychomotor Activity:  Decreased  Concentration:  Concentration: Poor  Recall:  Poor  Fund of Knowledge:  Poor  Language:  Poor  Akathisia:  No  Handed:  Left  AIMS (if indicated):     Assets:  Housing  ADL's:  Impaired  Cognition:  Impaired,  Mild  Sleep:        Treatment Plan Summary: Daily contact with patient to assess and evaluate symptoms and progress in treatment, Medication management and Plan Tried to have a rational conversation to no avail.  Very agitated and manic-like.  Clearly unable to take care of herself clearly unable to understand her current medical condition.  Not able to care for herself to the point of being a danger.  I filed commitment papers to keep her from trying to walk out of the hospital naked and agitated.  Spoke with social work.  Given her current agitation she may require admission to the inpatient psychiatric unit within the next day or so.  Disposition: Recommend psychiatric Inpatient admission when medically cleared. Supportive therapy provided about ongoing stressors.  Alethia Berthold, MD 12/04/2017 6:33 PM

## 2017-12-04 NOTE — Anesthesia Preprocedure Evaluation (Signed)
Anesthesia Evaluation  Patient identified by MRN, date of birth, ID band Patient awake    Reviewed: Allergy & Precautions, H&P , NPO status , Patient's Chart, lab work & pertinent test results  Airway Mallampati: III  TM Distance: >3 FB Neck ROM: full    Dental  (+) Chipped, Poor Dentition   Pulmonary Current Smoker,           Cardiovascular negative cardio ROS       Neuro/Psych Seizures -,  PSYCHIATRIC DISORDERS Bipolar Disorder Schizophrenia  Neuromuscular disease (CP) CVA negative neurological ROS  negative psych ROS   GI/Hepatic negative GI ROS, Neg liver ROS, (+)     substance abuse  alcohol use, Hepatitis -, CShe drinks 12-24 beers per day   Endo/Other  negative endocrine ROS  Renal/GU      Musculoskeletal   Abdominal   Peds  Hematology negative hematology ROS (+)   Anesthesia Other Findings Pink macular rash on right chest and RUE  Past Medical History: No date: Alcoholism (H[facility]) No date: CP (cerebral palsy) ([facility]) No date: Hepatitis C No date: Herpes No date: Polysubstance abuse ([facility]) 09/17/2013: Schizoaffective schizophrenia ([facility])     Comment:  Per notes from [facility] as on 2014  No date: Seizures ([facility]) No date: Stroke [facility])  Past Surgical History: 09/17/2013: AMPUTATION; Right     Comment:  Procedure: PARTIAL AMPUTATION 2ND TOE RIGHT FOOT;                Surgeon: Dallas Schimke, DPM;  Location: [facility];               Service: Podiatry;  Laterality: Right; No date: FOOT SURGERY     Comment:  rt and left No date: LEG SURGERY; Right     Comment:  x7  BMI    Body Mass Index:  21.46 kg/m    Patient was caught drinking water out of the faucet and has a history of non-compliance so will proceed as if she is not NPO and do an RSI.  Reproductive/Obstetrics negative OB ROS                             Anesthesia Physical  Anesthesia Plan  ASA: III  Anesthesia Plan:  General ETT   Post-op Pain Management:    Induction: Intravenous, Rapid sequence and Cricoid pressure planned  PONV Risk Score and Plan: Ondansetron, Dexamethasone, Midazolam and Treatment may vary due to age or medical condition  Airway Management Planned: Oral ETT  Additional Equipment:   Intra-op Plan:   Post-operative Plan:   Informed Consent: I have reviewed the patients History and Physical, chart, labs and discussed the procedure including the risks, benefits and alternatives for the proposed anesthesia with the patient or authorized representative who has indicated his/her understanding and acceptance.   Dental Advisory Given  Plan Discussed with: Anesthesiologist, CRNA and Surgeon  Anesthesia Plan Comments:         Anesthesia Quick Evaluation

## 2017-12-04 NOTE — Progress Notes (Signed)
Upon awaking from anesthesia, pt immediately begins cussing and making hateful comments.  Per report, this has been her baseline.   Speaking calmly to pt to offer support, but she "doesn't care" threatening to leave AMA.  Lurene Shadow, RN

## 2017-12-04 NOTE — Anesthesia Procedure Notes (Signed)
Procedure Name: Intubation Date/Time: 12/04/2017 12:28 PM Performed by: Doreen Salvage, CRNA Pre-anesthesia Checklist: Patient identified, Emergency Drugs available, Suction available and Patient being monitored Patient Re-evaluated:Patient Re-evaluated prior to induction Oxygen Delivery Method: Circle system utilized Preoxygenation: Pre-oxygenation with 100% oxygen Induction Type: IV induction, Cricoid Pressure applied and Rapid sequence Ventilation: Mask ventilation without difficulty Laryngoscope Size: Mac and 3 Grade View: Grade II Tube type: Oral Tube size: 7.0 mm Number of attempts: 1 Airway Equipment and Method: Stylet Placement Confirmation: ETT inserted through vocal cords under direct vision,  positive ETCO2 and breath sounds checked- equal and bilateral Secured at: 21 cm Tube secured with: Tape Dental Injury: Teeth and Oropharynx as per pre-operative assessment

## 2017-12-04 NOTE — Progress Notes (Signed)
Per Dr.Diamond okay to suspend patients tele

## 2017-12-04 NOTE — Progress Notes (Signed)
Clinical Education officer, museum (CSW) met with patient to provide emotional support. Patient stated that she wants to "get her goddman clothes and leave." MD and nurse were also in the room and explained that patient is not medically ready to leave. Patient reported that she would sign herself out AMA. Patient reported that she does not have a ride home and does not know how where her keys are to her apartment. Dr. Weber Cooks in on the floor now seeing patient.   [employer], LCSW [phone]

## 2017-12-04 NOTE — Progress Notes (Signed)
Patient removed splint and will not continue to wear. Patient up to bathroom sink getting water. Nursing tried to remove water cup from patient and explain plan of surgery and importance of being NPO. Patient non complaint at this time continues to drink water.

## 2017-12-04 NOTE — Transfer of Care (Signed)
Immediate Anesthesia Transfer of Care Note  Patient: Sylvia [name]  Procedure(s) Performed: Procedure(s): OPEN REDUCTION INTERNAL FIXATION (ORIF) ULNAR FRACTURE (Left)  Patient Location: PACU  Anesthesia Type:General  Level of Consciousness: sedated  Airway & Oxygen Therapy: Patient Spontanous Breathing and Patient connected to face mask oxygen  Post-op Assessment: Report given to RN and Post -op Vital signs reviewed and stable  Post vital signs: Reviewed and stable  Last Vitals:  Vitals:   12/04/17 1206 12/04/17 1430  BP: 106/79 110/73  Pulse: 68 67  Resp: 16 12  Temp: 36.6 C (!) 36.4 C  SpO2: 100% 100%    Complications: No apparent anesthesia complications

## 2017-12-05 ENCOUNTER — Inpatient Hospital Stay
Admission: AD | Admit: 2017-12-05 | Discharge: 2017-12-11 | DRG: 885 | Disposition: A | Discharge status: Home / Self Care | Payer: Medicare Other | Attending: Psychiatry | Admitting: Psychiatry

## 2017-12-05 ENCOUNTER — Encounter: Payer: Self-pay | Admitting: Orthopedic Surgery

## 2017-12-05 DIAGNOSIS — G809 Cerebral palsy, unspecified: Secondary | ICD-10-CM | POA: Diagnosis present

## 2017-12-05 DIAGNOSIS — F79 Unspecified intellectual disabilities: Secondary | ICD-10-CM | POA: Diagnosis present

## 2017-12-05 DIAGNOSIS — T50901A Poisoning by unspecified drugs, medicaments and biological substances, accidental (unintentional), initial encounter: Secondary | ICD-10-CM | POA: Diagnosis present

## 2017-12-05 DIAGNOSIS — T50902A Poisoning by unspecified drugs, medicaments and biological substances, intentional self-harm, initial encounter: Secondary | ICD-10-CM | POA: Diagnosis not present

## 2017-12-05 DIAGNOSIS — R402254 Coma scale, best verbal response, oriented, 24 hours or more after hospital admission: Secondary | ICD-10-CM | POA: Diagnosis not present

## 2017-12-05 DIAGNOSIS — Z8619 Personal history of other infectious and parasitic diseases: Secondary | ICD-10-CM | POA: Diagnosis not present

## 2017-12-05 DIAGNOSIS — Z8673 Personal history of transient ischemic attack (TIA), and cerebral infarction without residual deficits: Secondary | ICD-10-CM | POA: Diagnosis not present

## 2017-12-05 DIAGNOSIS — S5290XA Unspecified fracture of unspecified forearm, initial encounter for closed fracture: Secondary | ICD-10-CM | POA: Diagnosis present

## 2017-12-05 DIAGNOSIS — Z915 Personal history of self-harm: Secondary | ICD-10-CM

## 2017-12-05 DIAGNOSIS — F419 Anxiety disorder, unspecified: Secondary | ICD-10-CM | POA: Diagnosis present

## 2017-12-05 DIAGNOSIS — F429 Obsessive-compulsive disorder, unspecified: Secondary | ICD-10-CM | POA: Diagnosis present

## 2017-12-05 DIAGNOSIS — Z79899 Other long term (current) drug therapy: Secondary | ICD-10-CM

## 2017-12-05 DIAGNOSIS — Y903 Blood alcohol level of 60-79 mg/100 ml: Secondary | ICD-10-CM | POA: Diagnosis present

## 2017-12-05 DIAGNOSIS — F4310 Post-traumatic stress disorder, unspecified: Secondary | ICD-10-CM | POA: Diagnosis present

## 2017-12-05 DIAGNOSIS — Z89421 Acquired absence of other right toe(s): Secondary | ICD-10-CM | POA: Diagnosis not present

## 2017-12-05 DIAGNOSIS — K5900 Constipation, unspecified: Secondary | ICD-10-CM | POA: Diagnosis present

## 2017-12-05 DIAGNOSIS — G40909 Epilepsy, unspecified, not intractable, without status epilepticus: Secondary | ICD-10-CM | POA: Diagnosis present

## 2017-12-05 DIAGNOSIS — F17210 Nicotine dependence, cigarettes, uncomplicated: Secondary | ICD-10-CM | POA: Diagnosis present

## 2017-12-05 DIAGNOSIS — F1910 Other psychoactive substance abuse, uncomplicated: Secondary | ICD-10-CM | POA: Diagnosis present

## 2017-12-05 DIAGNOSIS — Z888 Allergy status to other drugs, medicaments and biological substances status: Secondary | ICD-10-CM | POA: Diagnosis not present

## 2017-12-05 DIAGNOSIS — Z818 Family history of other mental and behavioral disorders: Secondary | ICD-10-CM | POA: Diagnosis not present

## 2017-12-05 DIAGNOSIS — F17200 Nicotine dependence, unspecified, uncomplicated: Secondary | ICD-10-CM | POA: Diagnosis present

## 2017-12-05 DIAGNOSIS — R402144 Coma scale, eyes open, spontaneous, 24 hours or more after hospital admission: Secondary | ICD-10-CM | POA: Diagnosis not present

## 2017-12-05 DIAGNOSIS — F250 Schizoaffective disorder, bipolar type: Principal | ICD-10-CM | POA: Diagnosis present

## 2017-12-05 DIAGNOSIS — F1020 Alcohol dependence, uncomplicated: Secondary | ICD-10-CM | POA: Diagnosis present

## 2017-12-05 DIAGNOSIS — R45851 Suicidal ideations: Secondary | ICD-10-CM | POA: Diagnosis present

## 2017-12-05 LAB — BASIC METABOLIC PANEL
ANION GAP: 10 (ref 5–15)
BUN: 9 mg/dL (ref 6–20)
CALCIUM: 8.9 mg/dL (ref 8.9–10.3)
CO2: 25 mmol/L (ref 22–32)
CREATININE: 0.65 mg/dL (ref 0.44–1.00)
Chloride: 104 mmol/L (ref 98–111)
GFR calc non Af Amer: >60 mL/min (ref >60)
Glucose, Bld: 137 mg/dL — ABNORMAL HIGH (ref 70–99)
POTASSIUM: 4.1 mmol/L (ref 3.5–5.1)
SODIUM: 139 mmol/L (ref 135–145)

## 2017-12-05 LAB — CBC
HCT: 33 % — ABNORMAL LOW (ref 36.0–46.0)
Hemoglobin: 11 g/dL — ABNORMAL LOW (ref 12.0–15.0)
MCH: 33.1 pg (ref 26.0–34.0)
MCHC: 33.3 g/dL (ref 30.0–36.0)
MCV: 99.4 fL (ref 80.0–100.0)
NRBC: 0 % (ref 0.0–0.2)
PLATELETS: 250 10*3/uL (ref 150–400)
RBC: 3.32 MIL/uL — AB (ref 3.87–5.11)
RDW: 12.9 % (ref 11.5–15.5)
WBC: 11.6 10*3/uL — AB (ref 4.0–10.5)

## 2017-12-05 MED ORDER — METHOCARBAMOL 1000 MG/10ML IJ SOLN
500.0000 mg | Freq: Four times a day (QID) | INTRAVENOUS | Status: DC | PRN
  Filled 2017-12-05: qty 5

## 2017-12-05 MED ORDER — MAGNESIUM HYDROXIDE 400 MG/5ML PO SUSP
30.0000 mL | Freq: Every day | ORAL | Status: DC | PRN
  Administered 2017-12-07: 30 mL via ORAL
  Filled 2017-12-05: qty 30

## 2017-12-05 MED ORDER — GABAPENTIN 300 MG PO CAPS
300.0000 mg | ORAL_CAPSULE | Freq: Two times a day (BID) | ORAL | Status: DC
  Administered 2017-12-05 – 2017-12-09 (×8): 300 mg via ORAL
  Filled 2017-12-05 (×7): qty 1

## 2017-12-05 MED ORDER — NICOTINE 21 MG/24HR TD PT24
21.0000 mg | MEDICATED_PATCH | Freq: Every day | TRANSDERMAL | Status: DC
  Administered 2017-12-06 – 2017-12-10 (×5): 21 mg via TRANSDERMAL
  Filled 2017-12-05 (×5): qty 1

## 2017-12-05 MED ORDER — VITAMIN D 1000 UNITS PO TABS
1000.0000 [IU] | ORAL_TABLET | Freq: Every day | ORAL | Status: DC
  Administered 2017-12-06 – 2017-12-11 (×6): 1000 [IU] via ORAL
  Filled 2017-12-05 (×6): qty 1

## 2017-12-05 MED ORDER — RISPERIDONE 1 MG PO TBDP
4.0000 mg | ORAL_TABLET | Freq: Every day | ORAL | Status: DC
  Filled 2017-12-05: qty 4

## 2017-12-05 MED ORDER — LEVETIRACETAM 250 MG PO TABS
250.0000 mg | ORAL_TABLET | Freq: Two times a day (BID) | ORAL | Status: DC
  Administered 2017-12-05 – 2017-12-09 (×10): 250 mg via ORAL
  Filled 2017-12-05 (×9): qty 1

## 2017-12-05 MED ORDER — VITAMIN B-1 100 MG PO TABS
100.0000 mg | ORAL_TABLET | Freq: Every day | ORAL | Status: DC
  Administered 2017-12-06 – 2017-12-09 (×4): 100 mg via ORAL
  Filled 2017-12-05 (×4): qty 1

## 2017-12-05 MED ORDER — ACETAMINOPHEN 325 MG PO TABS
650.0000 mg | ORAL_TABLET | Freq: Four times a day (QID) | ORAL | Status: DC | PRN
  Administered 2017-12-07 – 2017-12-08 (×3): 650 mg via ORAL
  Filled 2017-12-05 (×3): qty 2

## 2017-12-05 MED ORDER — ONDANSETRON HCL 4 MG PO TABS
4.0000 mg | ORAL_TABLET | Freq: Four times a day (QID) | ORAL | Status: DC | PRN
  Administered 2017-12-09: 4 mg via ORAL
  Filled 2017-12-05 (×2): qty 1

## 2017-12-05 MED ORDER — ADULT MULTIVITAMIN W/MINERALS CH
1.0000 | ORAL_TABLET | Freq: Every day | ORAL | Status: DC
  Administered 2017-12-06 – 2017-12-09 (×4): 1 via ORAL
  Filled 2017-12-05 (×4): qty 1

## 2017-12-05 MED ORDER — METHOCARBAMOL 500 MG PO TABS
500.0000 mg | ORAL_TABLET | Freq: Four times a day (QID) | ORAL | Status: DC | PRN
  Administered 2017-12-05 – 2017-12-09 (×2): 500 mg via ORAL
  Filled 2017-12-05 (×3): qty 1

## 2017-12-05 MED ORDER — THIAMINE HCL 100 MG/ML IJ SOLN
100.0000 mg | Freq: Every day | INTRAMUSCULAR | Status: DC
  Filled 2017-12-05 (×4): qty 1

## 2017-12-05 MED ORDER — FOLIC ACID 1 MG PO TABS
1.0000 mg | ORAL_TABLET | Freq: Every day | ORAL | Status: DC
  Administered 2017-12-06 – 2017-12-11 (×6): 1 mg via ORAL
  Filled 2017-12-05 (×6): qty 1

## 2017-12-05 MED ORDER — ALUM & MAG HYDROXIDE-SIMETH 200-200-20 MG/5ML PO SUSP: 30.0000 mL | ORAL | Status: DC | PRN

## 2017-12-05 MED ORDER — DOCUSATE SODIUM 100 MG PO CAPS
100.0000 mg | ORAL_CAPSULE | Freq: Two times a day (BID) | ORAL | Status: DC
  Administered 2017-12-07 – 2017-12-11 (×6): 100 mg via ORAL
  Filled 2017-12-05 (×9): qty 1

## 2017-12-05 MED ORDER — HYDROXYZINE HCL 50 MG PO TABS
50.0000 mg | ORAL_TABLET | Freq: Three times a day (TID) | ORAL | Status: DC
  Administered 2017-12-06 – 2017-12-11 (×16): 50 mg via ORAL
  Filled 2017-12-05 (×16): qty 1

## 2017-12-05 MED ORDER — RISPERIDONE 1 MG PO TBDP
4.0000 mg | ORAL_TABLET | Freq: Every day | ORAL | Status: DC
  Administered 2017-12-05 – 2017-12-07 (×3): 4 mg via ORAL
  Filled 2017-12-05 (×5): qty 4

## 2017-12-05 MED ORDER — RISPERIDONE 1 MG PO TBDP
3.0000 mg | ORAL_TABLET | Freq: Every day | ORAL | Status: DC
  Filled 2017-12-05: qty 3

## 2017-12-05 MED ORDER — NICOTINE 21 MG/24HR TD PT24: 21.0000 mg | MEDICATED_PATCH | Freq: Every day | TRANSDERMAL | 0 refills | Status: DC

## 2017-12-05 MED ORDER — ONDANSETRON HCL 4 MG/2ML IJ SOLN: 4.0000 mg | Freq: Four times a day (QID) | INTRAMUSCULAR | Status: DC | PRN

## 2017-12-05 MED ORDER — BENZTROPINE MESYLATE 1 MG PO TABS
0.5000 mg | ORAL_TABLET | Freq: Every day | ORAL | Status: DC
  Administered 2017-12-05 – 2017-12-06 (×2): 0.5 mg via ORAL
  Filled 2017-12-05 (×2): qty 1

## 2017-12-05 NOTE — Consult Note (Signed)
[place] [facility] Consult   Reason for Consult: Consult to follow-up with this 32 year old woman with schizoaffective disorder and alcohol abuse Referring Physician: Tressia Miners Patient Identification: Sylvia [name] MRN:  [id number] Principal Diagnosis: Overdose Diagnosis:   Patient Active Problem List   Diagnosis Date Noted  . SIRS (systemic inflammatory response syndrome) ([place]) [R65.10] 12/02/2017  . Overdose [T50.901A] 12/02/2017  . Radius fracture [S52.90XA] 11/22/2017  . Substance induced mood disorder ([place]) [F19.94] 07/24/2016  . 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 Cocaine use disorder, moderate, dependence ([place]) [F14.20] 03/17/2015  . Cannabis use disorder, mild, abuse [F12.10] 03/17/2015  . Alcohol use disorder, severe, dependence ([place]) [F10.20] 03/17/2015  . Seizure disorder ([place]) [W09.811] 09/17/2013  . Cellulitis and abscess of toe of right foot [L03.031, L02.611] 09/16/2013  . Hepatitis C [B19.20]   . CP (cerebral palsy) ([place]) [G80.9]     Total Time spent with patient: 30 minutes  Subjective:   Sylvia [name] is a 32 y.o. female patient admitted with "at least my apartment got locked".  HPI: Patient interviewed chart reviewed.  Patient was a little calmer today.  Apparently she learned that the person from [place] did manage to make sure that her apartment was locked and got some of her belongings for her which is a big relief.  Patient still sleeping poorly at night.  Mood labile and irritable.  Speech rapid and disorganized.  Past Psychiatric History: Long history of mild cognitive impairment heavy alcohol abuse and mood instability  Risk to Self:   Risk to Others:   Prior Inpatient Therapy:   Prior Outpatient Therapy:    Past Medical History:  Past Medical History:  Diagnosis Date  . Alcoholism ([place])   . CP (cerebral palsy) ([place])   . Hepatitis C   . Herpes   . Polysubstance abuse ([place])   . Schizoaffective  schizophrenia ([place]) 09/17/2013   Per [facility] from [facility] as on 2014   . Seizures ([place])   . Stroke [facility])     Past Surgical History:  Procedure Laterality Date  . 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 FOOT SURGERY     rt and left  . LEG SURGERY Right    x7  . OPEN REDUCTION INTERNAL FIXATION (ORIF) DISTAL RADIAL FRACTURE Left 11/22/2017   Procedure: OPEN REDUCTION INTERNAL FIXATION (ORIF) DISTAL RADIAL FRACTURE and carpal tunnel release;  Surgeon: Thornton Park, MD;  Location: [facility] ORS;  Service: Orthopedics;  Laterality: Left;  . ORIF ULNAR FRACTURE Left 12/04/2017   Procedure: OPEN REDUCTION INTERNAL FIXATION (ORIF) ULNAR FRACTURE;  Surgeon: Thornton Park, MD;  Location: [facility] ORS;  Service: Orthopedics;  Laterality: Left;   Family History:  Family History  Problem Relation Age of Onset  . Bipolar disorder Mother   . Cancer Maternal Grandmother   . Diabetes Maternal Grandfather   . Hypertension Maternal Grandfather    Family Psychiatric  History: None Social History:  Social History   Substance and Sexual Activity  Alcohol Use Yes  . Alcohol/week: 3.0 standard drinks  . Types: 3 Cans of beer per week   Comment: Per patient 0.5 gallon to gallon when she can but since being in group home she can't do it as often      Social History   Substance and Sexual Activity  Drug Use Yes  . Types: "Crack" cocaine, Marijuana, Cocaine  Comment: Per patient uses herion and crack and cocaine "once in a blue moon"    Social History   Socioeconomic History  . Marital status: Single    Spouse name: Not on file  . Number of children: Not on file  . Years of education: Not on file  . Highest education level: Not on file  Occupational History  . Not on file  Social Needs  . Financial resource strain: Not on file  . Food insecurity:    Worry:  Not on file    Inability: Not on file  . Transportation needs:    Medical: Not on file    Non-medical: Not on file  Tobacco Use  . Smoking status: Current Every Day Smoker    Packs/day: 1.00    Years: 20.00    Pack years: 20.00    Types: Cigarettes  . Smokeless tobacco: Former Network engineer and Sexual Activity  . Alcohol use: Yes    Alcohol/week: 3.0 standard drinks    Types: 3 Cans of beer per week    Comment: Per patient 0.5 gallon to gallon when she can but since being in group home she can't do it as often   . Drug use: Yes    Types: "Crack" cocaine, Marijuana, Cocaine    Comment: Per patient uses herion and crack and cocaine "once in a blue moon"  . Sexual activity: Not Currently    Birth control/protection: Implant  Lifestyle  . Physical activity:    Days per week: Not on file    Minutes per session: Not on file  . Stress: Not on file  Relationships  . Social connections:    Talks on phone: Not on file    Gets together: Not on file    Attends religious service: Not on file    Active member of club or organization: Not on file    Attends meetings of clubs or organizations: Not on file    Relationship status: Not on file  Other Topics Concern  . Not on file  Social History Narrative   Lives at home by herself, only has acquaintances here   Does not work    Additional Social History:    Allergies:   Allergies  Allergen Reactions  . Diphenhydramine Hcl Rash    Reaction:  Lowers Dilantin level   . Fluoxetine Other (See Comments)    Reaction:  Altered mental status   . Baclofen Other (See Comments)    Drops blood pressure, per pt  . Depakote [Divalproex Sodium] Other (See Comments)    Reaction:  Seizures and drops pts BP  . Quetiapine Other (See Comments)    Reaction:  Drops pts BP    Labs:  Results for orders placed or performed during the hospital encounter of 12/02/17 (from the past 48 hour(s))  Pregnancy, urine POC     Status: None   Collection  Time: 12/04/17 11:54 AM  Result Value Ref Range   Preg Test, Ur NEGATIVE NEGATIVE    Comment:        THE SENSITIVITY OF THIS METHODOLOGY IS >24 mIU/mL   CBC     Status: Abnormal   Collection Time: 12/05/17  3:46 AM  Result Value Ref Range   WBC 11.6 (H) 4.0 - 10.5 K/uL   RBC 3.32 (L) 3.87 - 5.11 MIL/uL   Hemoglobin 11.0 (L) 12.0 - 15.0 g/dL   HCT 33.0 (L) 36.0 - 46.0 %   MCV 99.4 80.0 - 100.0 fL  MCH 33.1 26.0 - 34.0 pg   MCHC 33.3 30.0 - 36.0 g/dL   RDW 12.9 11.5 - 15.5 %   Platelets 250 150 - 400 K/uL   nRBC 0.0 0.0 - 0.2 %    Comment: Performed at [facility], [address] [place], [place] [address]  Basic metabolic panel     Status: Abnormal   Collection Time: 12/05/17  3:46 AM  Result Value Ref Range   Sodium 139 135 - 145 mmol/L   Potassium 4.1 3.5 - 5.1 mmol/L   Chloride 104 98 - 111 mmol/L   CO2 25 22 - 32 mmol/L   Glucose, Bld 137 (H) 70 - 99 mg/dL   BUN 9 6 - 20 mg/dL   Creatinine, Ser 0.65 0.44 - 1.00 mg/dL   Calcium 8.9 8.9 - 10.3 mg/dL   GFR calc non Af Amer >60 >60 mL/min   GFR calc Af Amer >60 >60 mL/min    Comment: (NOTE) The eGFR has been calculated using the CKD EPI equation. This calculation has not been validated in all clinical situations. eGFR's persistently <60 mL/min signify possible Chronic Kidney Disease.    Anion gap 10 5 - 15    Comment: Performed at [facility], [address] [place], [place] [address]    Current Facility-Administered Medications  Medication Dose Route Frequency Provider Last Rate Last Dose  . 0.9 % NaCl with KCl 20 mEq/ L  infusion   Intravenous Continuous Thornton Park, MD   Stopped at 12/05/17 [phone]  . acetaminophen (TYLENOL) tablet 1,000 mg  1,000 mg Oral Q6H Thornton Park, MD   1,000 mg at 12/05/17 0110  . acetaminophen (TYLENOL) tablet 325-650 mg  325-650 mg Oral Q6H PRN Thornton Park, MD      . alum & mag hydroxide-simeth (MAALOX/MYLANTA) 200-200-20 MG/5ML suspension 30 mL  30 mL  Oral Q4H PRN Thornton Park, MD      . benztropine (COGENTIN) tablet 0.5 mg  0.5 mg Oral QHS Thornton Park, MD   0.5 mg at 12/04/17 2116  . bisacodyl (DULCOLAX) suppository 10 mg  10 mg Rectal Daily PRN Thornton Park, MD      . cholecalciferol (VITAMIN D) tablet 1,000 Units  1,000 Units Oral Daily Thornton Park, MD   1,000 Units at 12/05/17 [phone]  . docusate sodium (COLACE) capsule 100 mg  100 mg Oral BID Thornton Park, MD   100 mg at 12/05/17 0836  . enoxaparin (LOVENOX) injection 40 mg  40 mg Subcutaneous Q24H Thornton Park, MD   40 mg at 12/05/17 0837  . folic acid (FOLVITE) tablet 1 mg  1 mg Oral Daily Thornton Park, MD   1 mg at 12/05/17 0836  . gabapentin (NEURONTIN) capsule 300 mg  300 mg Oral BID Thornton Park, MD   300 mg at 12/05/17 0837  . HYDROmorphone (DILAUDID) injection 0.5-1 mg  0.5-1 mg Intravenous Q4H PRN Thornton Park, MD      . hydrOXYzine (ATARAX/VISTARIL) tablet 50 mg  50 mg Oral TID Thornton Park, MD   50 mg at 12/05/17 1029  . ketorolac (TORADOL) 15 MG/ML injection 7.5 mg  7.5 mg Intravenous Q6H Thornton Park, MD   7.5 mg at 12/05/17 0516  . lactated ringers infusion   Intravenous Continuous Thornton Park, MD   Stopped at 12/04/17 1432  . levETIRAcetam (KEPPRA) tablet 250 mg  250 mg Oral BID Thornton Park, MD   250 mg at 12/05/17 1029  . LORazepam (ATIVAN) tablet 0-4 mg  0-4 mg Oral Q12H Thornton Park, MD   2 mg at 12/05/17 0317  . magnesium citrate solution 1 Bottle  1 Bottle Oral Once PRN Thornton Park, MD      . menthol-cetylpyridinium (CEPACOL) lozenge 3 mg  1 lozenge Oral PRN Thornton Park, MD       Or  . phenol (CHLORASEPTIC) mouth spray 1 spray  1 spray Mouth/Throat PRN Thornton Park, MD      . methocarbamol (ROBAXIN) tablet 500 mg  500 mg Oral Q6H PRN Thornton Park, MD       Or  . methocarbamol (ROBAXIN) 500 mg in dextrose 5 % 50 mL IVPB  500 mg Intravenous Q6H PRN Thornton Park, MD      . multivitamin with  minerals tablet 1 tablet  1 tablet Oral Daily Thornton Park, MD   1 tablet at 12/05/17 0836  . nicotine (NICODERM CQ - dosed in mg/24 hours) patch 21 mg  21 mg Transdermal Daily Thornton Park, MD   21 mg at 12/05/17 4665  . ondansetron (ZOFRAN) tablet 4 mg  4 mg Oral Q6H PRN Thornton Park, MD       Or  . ondansetron [facility]) injection 4 mg  4 mg Intravenous Q6H PRN Thornton Park, MD      . oxyCODONE (Oxy IR/ROXICODONE) immediate release tablet 10-15 mg  10-15 mg Oral Q4H PRN Thornton Park, MD      . oxyCODONE (Oxy IR/ROXICODONE) immediate release tablet 5-10 mg  5-10 mg Oral Q4H PRN Thornton Park, MD   5 mg at 12/05/17 1348  . polyethylene glycol (MIRALAX / GLYCOLAX) packet 17 g  17 g Oral Daily PRN Thornton Park, MD      . risperiDONE (RISPERDAL M-TABS) disintegrating tablet 3 mg  3 mg Oral QHS Saige Busby T, MD      . thiamine (VITAMIN B-1) tablet 100 mg  100 mg Oral Daily Thornton Park, MD   100 mg at 12/05/17 9935   Or  . thiamine (B-1) injection 100 mg  100 mg Intravenous Daily Thornton Park, MD   100 mg at 12/04/17 7017    Musculoskeletal: Strength & Muscle Tone: decreased Gait & Station: normal Patient leans: N/A  Psychiatric Specialty Exam: Physical Exam  Nursing note and vitals reviewed. Constitutional: She appears well-developed and well-nourished.  HENT:  Head: Normocephalic and atraumatic.  Eyes: Pupils are equal, round, and reactive to light. Conjunctivae are normal.  Neck: Normal range of motion.  Cardiovascular: Regular rhythm and normal heart sounds.  Respiratory: Effort normal. No respiratory distress.  GI: Soft.  Musculoskeletal: Normal range of motion.       Arms: Neurological: She is alert.  Skin: Skin is warm and dry.  Psychiatric: Her affect is labile. Her speech is tangential. She is agitated. She is not aggressive. Thought content is not paranoid. Cognition and memory are impaired. She expresses impulsivity and inappropriate  judgment. She expresses no homicidal and no suicidal ideation. She exhibits abnormal recent memory.    Review of Systems  Constitutional: Negative.   HENT: Negative.   Eyes: Negative.   Respiratory: Negative.   Cardiovascular: Negative.   Gastrointestinal: Negative.   Musculoskeletal: Negative.   Skin: Negative.   Neurological: Negative.   Psychiatric/Behavioral: Positive for memory loss and substance abuse. Negative for depression, hallucinations and suicidal ideas. The patient is nervous/anxious. The patient does not have insomnia.     Blood pressure 110/85, pulse 82, temperature 97.9 F (36.6 C), temperature source Oral, resp. rate 18, SpO2  100 %.There is no height or weight on file to calculate BMI.  General Appearance: Disheveled  Eye Contact:  Fair  Speech:  Slurred  Volume:  Increased  Mood:  Irritable  Affect:  Inappropriate  Thought Process:  Disorganized  Orientation:  Full (Time, Place, and Person)  Thought Content:  Illogical, Rumination and Tangential  Suicidal Thoughts:  No  Homicidal Thoughts:  No  Memory:  Immediate;   Fair Recent;   Poor Remote;   Poor  Judgement:  Impaired  Insight:  Shallow  Psychomotor Activity:  Decreased  Concentration:  Concentration: Poor  Recall:  Poor  Fund of Knowledge:  Poor  Language:  Poor  Akathisia:  No  Handed:  Right  AIMS (if indicated):     Assets:  Social Support  ADL's:  Impaired  Cognition:  Impaired,  Mild  Sleep:        Treatment Plan Summary: Daily contact with patient to assess and evaluate symptoms and progress in treatment, Medication management and Plan Patient continues under IVC filed yesterday.  Medically she has stabilized.  I spoke with hospitalist today and confirmed that the patient can be transferred to the psychiatric unit.  I think this is the right thing to do to try to get her emotionally stabilized.  Continue mood stabilizing medication as well as current medicines for other medical conditions.   Orders completed for transfer.  Patient aware of the plan.  Case reviewed with TTS.  Disposition: Recommend psychiatric Inpatient admission when medically cleared. Supportive therapy provided about ongoing stressors.  Alethia Berthold, MD 12/05/2017 3:19 PM

## 2017-12-05 NOTE — Evaluation (Signed)
Occupational Therapy Evaluation Patient Details Name: Sylvia [name] MRN: [id number] DOB: 06-09-1985 Today's Date: 12/05/2017    History of Present Illness Pt. is a 32 y.o. femle who was admitted to [facility] with a Left Distal Ulna Fracture. Pt. underwent an ORIF, secondary to noncompliance with conservative management as pt. removed the cast. Pt. PMHx includes: Alcoholism, CP, Hepatitis C, Herpes, Polysubstance Abuse, Schizoaffective Schizophrenia, seizures, stroke.    Clinical Impression   Pt. presents with 8/10 LUE pain, RUE weakness, psychiatric impairments, limited activity tolerance, and limited functional mobility which hinders her ability to complete basic ADL and IADL functioning. Pt. Resides at home alone. Pt. Was independent with ADLs, and IADL functioning: including meal preparation reporting that she mostly heats items up in the microwave,  and medication management. Pt. is unemployed, and does not drive. Pt. Walks to the grocery store across the street. Pt. Was distracted, and concerned about her living situation, and needing to move from [place] closer to her support system.  Pt. Education was provided about precautions with the LUE, and compensatory strategies during ADLs. Pt. education was provided about A/E use for LE ADLs, and general reacher use. Pt. Could benefit from OT services for ADL training, A/E training, and pt. education about home modification, and DME. Pt. Would benefit from follow-up OT services at home.    Follow Up Recommendations  Home health OT    Equipment Recommendations       Recommendations for Other Services       Precautions / Restrictions Precautions Precautions: Fall Precaution Comments: LUE - NWB  Restrictions Weight Bearing Restrictions: Yes LUE Weight Bearing: Non weight bearing      Mobility Bed Mobility               General bed mobility comments: Pt. seen for bedside eval  Transfers Overall transfer level: Independent                    Balance                                           ADL either performed or assessed with clinical judgement   ADL Overall ADL's : Needs assistance/impaired Eating/Feeding: Set up;Modified independent   Grooming: Moderate assistance   Upper Body Bathing: Moderate assistance;Sitting;Minimal assistance   Lower Body Bathing: Moderate assistance;Sit to/from stand   Upper Body Dressing : Sitting;Minimal assistance;Moderate assistance   Lower Body Dressing: Minimal assistance;Moderate assistance                       Vision Baseline Vision/History: No visual deficits Patient Visual Report: No change from baseline       Perception     Praxis      Pertinent Vitals/Pain Pain Assessment: 0-10 Pain Score: 8  Pain Location: Left wrist and hand Pain Descriptors / Indicators: Aching Pain Intervention(s): Limited activity within patient's tolerance;Patient requesting pain meds-RN notified;Repositioned     Hand Dominance Left   Extremity/Trunk Assessment Upper Extremity Assessment Upper Extremity Assessment: LUE deficits/detail RUE Deficits / Details: RUE impairments with strength, ROM, and flexibility due to CP LUE: Unable to fully assess due to immobilization           Communication Communication Communication: No difficulties   Cognition Arousal/Alertness: Awake/alert Behavior During Therapy: WFL for tasks assessed/performed Overall Cognitive Status: Within Functional Limits for tasks assessed  General Comments       Exercises     Shoulder Instructions      Home Living Family/patient expects to be discharged to:: Private residence Living Arrangements: Alone Available Help at Discharge: Neighbor Type of Home: Apartment Home Access: Level entry     Home Layout: One level     Bathroom Shower/Tub: Chief Strategy Officer: Standard     Home  Equipment: None          Prior Functioning/Environment Level of Independence: Independent                 OT Problem List: Decreased strength;Pain;Decreased range of motion;Decreased safety awareness;Increased edema;Decreased activity tolerance;Impaired UE functional use;Decreased knowledge of use of DME or AE      OT Treatment/Interventions: Self-care/ADL training;Therapeutic activities;Therapeutic exercise;DME and/or AE instruction;Patient/family education    OT Goals(Current goals can be found in the care plan section) Acute Rehab OT Goals Patient Stated Goal: Pt. regain independence OT Goal Formulation: With patient Potential to Achieve Goals: Good  OT Frequency: Min 2X/week   Barriers to D/C:            Co-evaluation              AM-PAC PT "6 Clicks" Daily Activity     Outcome Measure Help from another person eating meals?: None Help from another person taking care of personal grooming?: A Little Help from another person toileting, which includes using toliet, bedpan, or urinal?: A Little Help from another person bathing (including washing, rinsing, drying)?: A Lot Help from another person to put on and taking off regular upper body clothing?: A Lot Help from another person to put on and taking off regular lower body clothing?: A Lot 6 Click Score: 16   End of Session    Activity Tolerance: Patient limited by pain Patient left: in bed;with call bell/phone within reach;with bed alarm set  OT Visit Diagnosis: Pain;Muscle weakness (generalized) (M62.81) Pain - Right/Left: Left Pain - part of body: Arm                Time: 1610-9604 OT Time Calculation (min): 20 min Charges:  OT General Charges $OT Visit: 1 Visit OT Evaluation $OT Eval Low Complexity: 1 Low  Olegario Messier, MS, OTR/L   Olegario Messier 12/05/2017, 12:47 PM

## 2017-12-05 NOTE — Progress Notes (Signed)
[place] [phone] office, [phone] cell with [facility] Adult Protective Services (APS) and guardianship came to visit patient today. Per Jon Gills APS has accepted and opened the case. CSW provided [place] with patient's guardianship form. Jon Gills is aware that patient will D/C to the inpatient psych unit at [facility] today.   [employer], LCSW [phone]

## 2017-12-05 NOTE — Discharge Summary (Signed)
[facility] - [place] [facility]   PATIENT NAME: Sylvia [name]    MR#:  [id number]  DATE OF BIRTH:  05/12/1985  DATE OF ADMISSION:  12/02/2017   ADMITTING PHYSICIAN: Ihor Austin, MD  DATE OF DISCHARGE: 12/05/17  PRIMARY CARE PHYSICIAN: Jeanice Lim, PA-C   ADMISSION DIAGNOSIS:   Suicidal ideation [R45.851] Cellulitis of left upper extremity [L03.114] Drug overdose, undetermined intent, initial encounter [T50.904A] Sepsis, due to unspecified organism, unspecified whether acute organ dysfunction present (HCC) [A41.9]  DISCHARGE DIAGNOSIS:   Principal Problem:   Overdose Active Problems:   Borderline intellectual functioning   Alcohol use disorder, severe, dependence (HCC)   SIRS (systemic inflammatory response syndrome) (HCC)   SECONDARY DIAGNOSIS:   Past Medical History:  Diagnosis Date  . Alcoholism (HCC)   . CP (cerebral palsy) (HCC)   . Hepatitis C   . Herpes   . Polysubstance abuse (HCC)   . Schizoaffective schizophrenia (HCC) 09/17/2013   Per notes from [facility] as on 2014   . Seizures (HCC)   . Stroke [facility])     HOSPITAL COURSE:   [name] a32 y.o.femalewith a known history ofpolysubstance abuse, alcohol abuse, cerebral palsy, hep C due to use of IV drugs in the past, schizophrenia, depression and anxiety presents to hospital after drug overdose and left hand ulnar fracture.    1. Left hand nondisplaced distal ulnar fracture-appreciate orthopedics consult and patient had ORIF done - POD #1 today -Postop pain control and physical therapy recommended. -Management per orthopedic surgeon  -Patient has a cast on, plan to leave it in place until seen by Ortho in the office in 2 weeks -Can use a sling to the left hand  2.  Drug overdose-appreciate psychiatry consult.  -  Patient placed on involuntary commitment  -After the surgery she became more alert and mania-like behavior very anxious and trying to leave AGAINST  MEDICAL ADVICE. -Patient has a legally appointed guardian, however it was so hard to get in touch with the guardian.  Social worker has restarted a Adult Management consultant report to see if patient would need a new guardian. -Appreciate psychiatry consult -Will need inpatient psychiatry treatment -Continue her outpatient medications at this time and they can be adjusted down stairs. - has a sitter at bedside now  3. Seizure disorder-continue Keppra.    4. Depression and anxiety- celexa, abilify, prozac and risperidone  5. Polysubstance abuse-tox screen is negative, patient says he has not been doing cocaine lately.  But has been drinking heavily.  6. Alcohol abuse disorder-monitor for any withdrawals. Use Ativan as needed  Will be discharged to psychiatry floor today  DISCHARGE CONDITIONS:   Guarded  CONSULTS OBTAINED:   Treatment Team:  Audery Amel, MD Juanell Fairly, MD Deeann Saint, MD  DRUG ALLERGIES:   Allergies  Allergen Reactions  . Diphenhydramine Hcl Rash    Reaction:  Lowers Dilantin level   . Fluoxetine Other (See Comments)    Reaction:  Altered mental status   . Baclofen Other (See Comments)    Drops blood pressure, per pt  . Depakote [Divalproex Sodium] Other (See Comments)    Reaction:  Seizures and drops pts BP  . Quetiapine Other (See Comments)    Reaction:  Drops pts BP   DISCHARGE MEDICATIONS:   Allergies as of 12/05/2017      Reactions   Diphenhydramine Hcl Rash   Reaction:  Lowers Dilantin level    Fluoxetine Other (See Comments)   Reaction:  Altered mental status    Baclofen Other (See Comments)   Drops blood pressure, per pt   Depakote [divalproex Sodium] Other (See Comments)   Reaction:  Seizures and drops pts BP   Quetiapine Other (See Comments)   Reaction:  Drops pts BP      Medication List    STOP taking these medications   nicotine polacrilex 2 MG gum Commonly known as:  NICORETTE   perphenazine 2 MG  tablet Commonly known as:  TRILAFON     TAKE these medications   ARIPiprazole 5 MG tablet Commonly known as:  ABILIFY Take 5 mg by mouth daily.   benztropine 0.5 MG tablet Commonly known as:  COGENTIN Take 0.5 mg by mouth at bedtime.   cholecalciferol 1000 units tablet Commonly known as:  VITAMIN D Take 1,000 Units by mouth daily.   citalopram 20 MG tablet Commonly known as:  CELEXA Take 20 mg by mouth daily.   docusate sodium 100 MG capsule Commonly known as:  COLACE Take 1 capsule (100 mg total) by mouth 2 (two) times daily.   FLUoxetine 20 MG capsule Commonly known as:  PROZAC Take 20 mg by mouth daily.   gabapentin 300 MG capsule Commonly known as:  NEURONTIN Take 300 mg by mouth 2 (two) times daily.   hydrOXYzine 50 MG tablet Commonly known as:  ATARAX/VISTARIL Take 50 mg by mouth 3 (three) times daily.   levETIRAcetam 250 MG tablet Commonly known as:  KEPPRA Take 250 mg by mouth at bedtime.   nicotine 21 mg/24hr patch Commonly known as:  NICODERM CQ - dosed in mg/24 hours Place 1 patch (21 mg total) onto the skin daily. Start taking on:  12/06/2017   oxyCODONE 5 MG immediate release tablet Commonly known as:  Oxy IR/ROXICODONE Take 1 tablet (5 mg total) by mouth every 4 (four) hours as needed for moderate pain or severe pain.   risperiDONE 3 MG tablet Commonly known as:  RISPERDAL Take 3 mg by mouth 2 (two) times daily.        DISCHARGE INSTRUCTIONS:   1. Transfer to behavioral medicine unit  DIET:   Regular diet  ACTIVITY:   Activity as tolerated  OXYGEN:   Home Oxygen: No.  Oxygen Delivery: room air  DISCHARGE LOCATION:   Behavioral Medicine Unit   If you experience worsening of your admission symptoms, develop shortness of breath, life threatening emergency, suicidal or homicidal thoughts you must seek medical attention immediately by calling 911 or calling your MD immediately  if symptoms less severe.  You Must read complete  instructions/literature along with all the possible adverse reactions/side effects for all the Medicines you take and that have been prescribed to you. Take any new Medicines after you have completely understood and accpet all the possible adverse reactions/side effects.   Please note  You were cared for by a hospitalist during your hospital stay. If you have any questions about your discharge medications or the care you received while you were in the hospital after you are discharged, you can call the unit and asked to speak with the hospitalist on call if the hospitalist that took care of you is not available. Once you are discharged, your primary care physician will handle any further medical issues. Please note that NO REFILLS for any discharge medications will be authorized once you are discharged, as it is imperative that you return to your primary care physician (or establish a relationship with a primary care physician if you  do not have one) for your aftercare needs so that they can reassess your need for medications and monitor your lab values.    On the day of Discharge:  VITAL SIGNS:   Blood pressure 110/85, pulse 82, temperature 97.9 F (36.6 C), temperature source Oral, resp. rate 18, SpO2 100 %.  PHYSICAL EXAMINATION:    GENERAL:32 y.o.-year-old patient lying in the bed with no acute distress.  EYES: Pupils equal, round, reactive to light and accommodation. No scleral icterus. Extraocular muscles intact.  HEENT: Head atraumatic, normocephalic. Oropharynx and nasopharynx clear.  NECK: Supple, no jugular venous distention. No thyroid enlargement, no tenderness.  LUNGS: Normal breath sounds bilaterally, no wheezing, rales,rhonchi or crepitation. No use of accessory muscles of respiration.  CARDIOVASCULAR: S1, S2 normal. No murmurs, rubs, or gallops.  ABDOMEN: Soft, nontender, nondistended. Bowel sounds present. No organomegaly or mass.  EXTREMITIES: left hand in a cast. No  pedal edema, cyanosis, or clubbing.  NEUROLOGIC: Cranial nerves II through XII are intact. Muscle strength 5/5 in all extremities. Sensation intact. Gait not checked.  PSYCHIATRIC: The patient is alert and oriented x 3.  Calm and sleepy today- but anxious, maniac like behaviour yesterday SKIN: No obvious rash, lesion, or ulcer.   DATA REVIEW:   CBC Recent Labs  Lab 12/05/17 0346  WBC 11.6*  HGB 11.0*  HCT 33.0*  PLT 250    Chemistries  Recent Labs  Lab 12/02/17 1420  12/05/17 0346  NA 139   < > 139  K 3.9   < > 4.1  CL 106   < > 104  CO2 22   < > 25  GLUCOSE 95   < > 137*  BUN 5*   < > 9  CREATININE 0.51   < > 0.65  CALCIUM 8.5*   < > 8.9  MG 2.3  --   --   AST 34  --   --   ALT 21  --   --   ALKPHOS 65  --   --   BILITOT 0.3  --   --    < > = values in this interval not displayed.     Microbiology Results  Results for orders placed or performed during the hospital encounter of 12/02/17  Blood culture (routine x 2)     Status: None (Preliminary result)   Collection Time: 12/02/17  2:20 PM  Result Value Ref Range Status   Specimen Description BLOOD BLOOD RIGHT HAND  Final   Special Requests   Final    BOTTLES DRAWN AEROBIC AND ANAEROBIC Blood Culture adequate volume   Culture   Final    NO GROWTH 3 DAYS Performed at [facility], [address] [place], [place] [address]    Report Status PENDING  Incomplete  Blood culture (routine x 2)     Status: None (Preliminary result)   Collection Time: 12/02/17 11:04 PM  Result Value Ref Range Status   Specimen Description BLOOD RIGHT HAND  Final   Special Requests   Final    BOTTLES DRAWN AEROBIC ONLY Blood Culture adequate volume   Culture   Final    NO GROWTH 3 DAYS Performed at [facility], [address] [place], [place] [address]    Report Status PENDING  Incomplete    RADIOLOGY:  Dg Wrist 2 Views Left  Result Date: 12/04/2017 CLINICAL DATA:  Left wrist fracture. EXAM: LEFT WRIST  - 2 VIEW COMPARISON:  Radiographs of December 02, 2017. FINDINGS:  Status post surgical internal fixation of distal left radial fracture. Interval surgical internal fixation of distal left fibular fracture. Good alignment of fracture components is noted. The left forearm has been casted and immobilized. IMPRESSION: Status post surgical internal fixation of distal left fibular fracture. Electronically Signed   By: Lupita Raider, M.D.   On: 12/04/2017 15:43     Management plans discussed with the patient, family and they are in agreement.  CODE STATUS:     Code Status Orders  (From admission, onward)         Start     Ordered   12/02/17 1736  Full code  Continuous     12/02/17 1735        Code Status History    Date Active Date Inactive Code Status Order ID Comments User Context   11/22/2017 2003 11/24/2017 1934 Full Code [id number]  Juanell Fairly, MD Inpatient   11/22/2017 0941 11/22/2017 2002 Full Code [id number]  Juanell Fairly, MD Inpatient   02/04/2017 0132 02/04/2017 2130 Full Code [id number]  Rebecka Apley, MD ED   09/13/2016 1939 09/14/2016 0623 Full Code [id number]  Arnaldo Natal, MD ED   03/17/2015 0514 03/24/2015 1802 Full Code [id number]  Worthy Flank, NP Inpatient   09/14/2013 2152 09/20/2013 1846 Full Code [id number]  Haydee Monica, MD Inpatient      TOTAL TIME TAKING CARE OF THIS PATIENT: 38 minutes.    Enid Baas M.D on 12/05/2017 at 1:54 PM  Between 7am to 6pm - Pager - [phone]  After 6pm go to [email or web address] - Scientist, research (life sciences) [facility]  Office  [phone]  CC: Primary care physician; Haubert, Vernell Leep, PA-C   Note: This dictation was prepared with Dragon dictation along with smaller phrase technology. Any transcriptional errors that result from this process are unintentional.

## 2017-12-05 NOTE — BH Assessment (Signed)
Patient is to be admitted to [facility] by Dr. Toni Amend.  Attending Physician will be Dr. Jennet Maduro.   Patient has been assigned to room 304, by [facility] Charge Nurse T'Yawn.   ER staff is aware of the admission:  Irving Burton, Patient's Nurse   Ethelene Browns, Patient Access.

## 2017-12-05 NOTE — Evaluation (Signed)
Physical Therapy Evaluation Patient Details Name: Sylvia [name] MRN: [id number] DOB: [birth year] Today's Date: 12/05/2017   History of Present Illness  Pt. is a 32 y.o. femle who was admitted to [facility] with a Left Distal Ulna Fracture. Pt. underwent an ORIF, secondary to noncompliance with conservative management as pt. removed the cast. Pt. PMHx includes: Alcoholism, CP, Hepatitis C, Herpes, Polysubstance Abuse, Schizoaffective Schizophrenia, seizures, stroke.   Clinical Impression  Pt is a pleasant 32 year old female who was admitted for L UE ORIF on 10/17. Pt demonstrates all transfers/ambulation at baseline level. Educated on L elbow/shoulder ROM to prevent joint stiffness. Educated to maintain NWB on L UE. Pt does not require any further PT needs at this time. Pt will be dc in house and does not require follow up. RN aware. Will dc current orders.     Follow Up Recommendations No PT follow up    Equipment Recommendations  None recommended by PT    Recommendations for Other Services       Precautions / Restrictions Precautions Precautions: Fall Precaution Comments: LUE - NWB  Required Braces or Orthoses: Sling Restrictions Weight Bearing Restrictions: Yes LUE Weight Bearing: Non weight bearing      Mobility  Bed Mobility               General bed mobility comments: Pt seated in chair upon arrival. Bed mobility not performed  Transfers Overall transfer level: Independent Equipment used: None             General transfer comment: Safe technique performed with upright posture.  Ambulation/Gait Ambulation/Gait assistance: Supervision Gait Distance (Feet): 400 Feet Assistive device: None Gait Pattern/deviations: WFL(Within Functional Limits)     General Gait Details: Safe technique performed during ambulation with good gait speed noted. Pt with slightly hunched over posture, however likely related to CP diagnosis  Stairs            Wheelchair  Mobility    Modified Rankin (Stroke Patients Only)       Balance Overall balance assessment: Independent                                           Pertinent Vitals/Pain Pain Assessment: No/denies pain Pain Score: 8  Pain Location: Left wrist and hand Pain Descriptors / Indicators: Aching Pain Intervention(s): Limited activity within patient's tolerance;Patient requesting pain meds-RN notified;Repositioned    Home Living Family/patient expects to be discharged to:: Private residence Living Arrangements: Alone Available Help at Discharge: Neighbor Type of Home: Apartment Home Access: Level entry     Home Layout: One level Home Equipment: None      Prior Function Level of Independence: Independent         Comments: Does not drive, currently unemployed, walks to get groceries at store across street     Hand Dominance   Dominant Hand: Left    Extremity/Trunk Assessment   Upper Extremity Assessment Upper Extremity Assessment: Defer to OT evaluation RUE Deficits / Details: RUE impairments with strength, ROM, and flexibility due to CP LUE: Unable to fully assess due to immobilization    Lower Extremity Assessment Lower Extremity Assessment: Generalized weakness(B LE grossly 4/5 Keeps B knees flexed)       Communication   Communication: No difficulties  Cognition Arousal/Alertness: Awake/alert Behavior During Therapy: WFL for tasks assessed/performed Overall Cognitive Status: Within Functional Limits for tasks  assessed                                        General Comments      Exercises Other Exercises Other Exercises: Gave exercises for L shoulder and elbow for improved ROM including flexion/extension. All ther-ex performed x 5-8 reps with supervision.   Assessment/Plan    PT Assessment Patent does not need any further PT services  PT Problem List Pain       PT Treatment Interventions      PT Goals (Current  goals can be found in the Care Plan section)  Acute Rehab PT Goals Patient Stated Goal: Pt. regain independence PT Goal Formulation: All assessment and education complete, DC therapy Time For Goal Achievement: 12/05/17 Potential to Achieve Goals: Good    Frequency     Barriers to discharge        Co-evaluation               AM-PAC PT "6 Clicks" Daily Activity  Outcome Measure Difficulty turning over in bed (including adjusting bedclothes, sheets and blankets)?: None Difficulty moving from lying on back to sitting on the side of the bed? : None Difficulty sitting down on and standing up from a chair with arms (e.g., wheelchair, bedside commode, etc,.)?: None Help needed moving to and from a bed to chair (including a wheelchair)?: None Help needed walking in hospital room?: None Help needed climbing 3-5 steps with a railing? : None 6 Click Score: 24    End of Session Equipment Utilized During Treatment: (no sling available) Activity Tolerance: Patient tolerated treatment well Patient left: in chair(with sitter present) Nurse Communication: Mobility status PT Visit Diagnosis: Difficulty in walking, not elsewhere classified (R26.2) Pain - Right/Left: Left Pain - part of body: Arm;Hand    Time: 1610-9604 PT Time Calculation (min) (ACUTE ONLY): 18 min   Charges:   PT Evaluation $PT Eval Low Complexity: 1 Low PT Treatments $Therapeutic Exercise: 8-22 mins        Elizabeth Palau, PT, DPT [phone]   Kenyonna Micek 12/05/2017, 3:02 PM

## 2017-12-05 NOTE — Progress Notes (Signed)
Patient ID: Sylvia [name], female   DOB: 10-Sep-1985, 32 y.o.   MRN: [id number] Per State regulations 482.30 this chart was reviewed for medical necessity with respect to the patient's admission/duration of stay.    Next review date: 12/09/17  Thurman Coyer, BSN, RN-BC  Case Manager

## 2017-12-05 NOTE — Care Management Important Message (Signed)
Spoke with Pam Drown, aunt and legal guardian, to review Medicare IM. Verbal consent given to note on IM that she was aware of right.

## 2017-12-05 NOTE — Progress Notes (Signed)
  Subjective:  POD#1 s/p left ORIF of ulna.  Patient reports left pain as moderate.    Objective:   VITALS:   Vitals:   12/04/17 1515 12/04/17 1537 12/05/17 0329 12/05/17 0803  BP: (!) 110/97 (!) 127/99 113/73 120/85  Pulse: 96 83 90 97  Resp: 11 18 18 18   Temp:   98.6 F (37 C) 98.5 F (36.9 C)  TempSrc:   Oral Oral  SpO2: 100% 100% 100% 100%    PHYSICAL EXAM: Left wrist: I personally changed dressings today and applied short arm cast so patient can go to behavioral medicine today. Neurovascular intact Sensation intact distally Intact pulses distally No cellulitis present Compartment soft  LABS  Results for orders placed or performed during the hospital encounter of 12/02/17 (from the past 24 hour(s))  CBC     Status: Abnormal   Collection Time: 12/05/17  3:46 AM  Result Value Ref Range   WBC 11.6 (H) 4.0 - 10.5 K/uL   RBC 3.32 (L) 3.87 - 5.11 MIL/uL   Hemoglobin 11.0 (L) 12.0 - 15.0 g/dL   HCT 16.1 (L) 09.6 - 04.5 %   MCV 99.4 80.0 - 100.0 fL   MCH 33.1 26.0 - 34.0 pg   MCHC 33.3 30.0 - 36.0 g/dL   RDW 40.9 81.1 - 91.4 %   Platelets 250 150 - 400 K/uL   nRBC 0.0 0.0 - 0.2 %  Basic metabolic panel     Status: Abnormal   Collection Time: 12/05/17  3:46 AM  Result Value Ref Range   Sodium 139 135 - 145 mmol/L   Potassium 4.1 3.5 - 5.1 mmol/L   Chloride 104 98 - 111 mmol/L   CO2 25 22 - 32 mmol/L   Glucose, Bld 137 (H) 70 - 99 mg/dL   BUN 9 6 - 20 mg/dL   Creatinine, Ser 7.82 0.44 - 1.00 mg/dL   Calcium 8.9 8.9 - 95.6 mg/dL   GFR calc non Af Amer >60 >60 mL/min   GFR calc Af Amer >60 >60 mL/min   Anion gap 10 5 - 15    Dg Wrist 2 Views Left  Result Date: 12/04/2017 CLINICAL DATA:  Left wrist fracture. EXAM: LEFT WRIST - 2 VIEW COMPARISON:  Radiographs of December 02, 2017. FINDINGS: Status post surgical internal fixation of distal left radial fracture. Interval surgical internal fixation of distal left fibular fracture. Good alignment of fracture components  is noted. The left forearm has been casted and immobilized. IMPRESSION: Status post surgical internal fixation of distal left fibular fracture. Electronically Signed   By: Lupita Raider, M.D.   On: 12/04/2017 15:43    Assessment/Plan: 1 Day Post-Op   Principal Problem:   Overdose Active Problems:   Borderline intellectual functioning   Alcohol use disorder, severe, dependence (HCC)   SIRS (systemic inflammatory response syndrome) (HCC)  Patient is doing well postop from an orthopedic standpoint.  Continue to elevate the left upper extremity on pillows whenever possible.  Keep the cast in place until the patient follows up with me in the office.  Patient should follow-up with me in 10 to 14 days.  Patient should use a sling on the left upper extremity when up out of bed walking or standing.  Patient should not push, pull, lift, carry or bear weight on the left upper extremity until her fracture heals in approximately 4 to 6 weeks.    Juanell Fairly , MD 12/05/2017, 1:39 PM

## 2017-12-06 ENCOUNTER — Other Ambulatory Visit: Payer: Self-pay

## 2017-12-06 DIAGNOSIS — F250 Schizoaffective disorder, bipolar type: Principal | ICD-10-CM

## 2017-12-06 LAB — BASIC METABOLIC PANEL
Anion gap: 10 (ref 5–15)
BUN: 10 mg/dL (ref 6–20)
CALCIUM: 8.9 mg/dL (ref 8.9–10.3)
CO2: 26 mmol/L (ref 22–32)
CREATININE: 0.79 mg/dL (ref 0.44–1.00)
Chloride: 103 mmol/L (ref 98–111)
GFR calc non Af Amer: >60 mL/min (ref >60)
Glucose, Bld: 97 mg/dL (ref 70–99)
Potassium: 3.9 mmol/L (ref 3.5–5.1)
SODIUM: 139 mmol/L (ref 135–145)

## 2017-12-06 LAB — TSH: TSH: 2.376 u[IU]/mL (ref 0.350–4.500)

## 2017-12-06 LAB — CBC
HCT: 36.2 % (ref 36.0–46.0)
Hemoglobin: 11.6 g/dL — ABNORMAL LOW (ref 12.0–15.0)
MCH: 32.6 pg (ref 26.0–34.0)
MCHC: 32 g/dL (ref 30.0–36.0)
MCV: 101.7 fL — ABNORMAL HIGH (ref 80.0–100.0)
NRBC: 0 % (ref 0.0–0.2)
PLATELETS: 233 10*3/uL (ref 150–400)
RBC: 3.56 MIL/uL — ABNORMAL LOW (ref 3.87–5.11)
RDW: 13.4 % (ref 11.5–15.5)
WBC: 7 10*3/uL (ref 4.0–10.5)

## 2017-12-06 LAB — LIPID PANEL
CHOL/HDL RATIO: 2.9 ratio
Cholesterol: 179 mg/dL (ref 0–200)
HDL: 62 mg/dL (ref >40)
LDL CALC: 93 mg/dL (ref 0–99)
Triglycerides: 122 mg/dL (ref <150)
VLDL: 24 mg/dL (ref 0–40)

## 2017-12-06 LAB — HEMOGLOBIN A1C
Hgb A1c MFr Bld: 5.1 % (ref 4.8–5.6)
Mean Plasma Glucose: 99.67 mg/dL

## 2017-12-06 MED ORDER — TRAZODONE HCL 50 MG PO TABS
50.0000 mg | ORAL_TABLET | Freq: Every day | ORAL | Status: DC
  Administered 2017-12-06 – 2017-12-10 (×4): 50 mg via ORAL
  Filled 2017-12-06 (×4): qty 1

## 2017-12-06 MED ORDER — LORAZEPAM 1 MG PO TABS: 1.0000 mg | ORAL_TABLET | Freq: Three times a day (TID) | ORAL | Status: DC | PRN

## 2017-12-06 NOTE — Progress Notes (Signed)
0800 Dayroom  Breakfast  with 1:1 0900 In room  Bath room   1:1 present 1000 Room  With MD 1:1 present  11:00 In Room  Sleeping  1:1 present 12:00 Dayroom eating  Lunch  1:1 present  1300 In group therapy   1400 In room  1:1 present  1500 Courtyard 1:1 present  1600 Courtyard  1:1 present 1700 Dayroom eating  1:1 present  1800 Bathing  In room  1:1 present 1900 Community 1:1 present

## 2017-12-06 NOTE — BHH Group Notes (Signed)
LCSW Group Therapy Note  12/06/2017 1:15pm  Type of Therapy and Topic:  Group Therapy:  Cognitive Distortions  Participation Level:  Minimal   Description of Group:    Patients in this group will be introduced to the topic of cognitive distortions.  Patients will identify and describe cognitive distortions, describe the feelings these distortions create for them.  Patients will identify one or more situations in their personal life where they have cognitively distorted thinking and will verbalize challenging this cognitive distortion through positive thinking skills.  Patients will practice the skill of using positive affirmations to challenge cognitive distortions using affirmation cards.    Therapeutic Goals:  1. Patient will identify two or more cognitive distortions they have used 2. Patient will identify one or more emotions that stem from use of a cognitive distortion 3. Patient will demonstrate use of a positive affirmation to counter a cognitive distortion through discussion and/or role play. 4. Patient will describe one way cognitive distortions can be detrimental to wellness   Summary of Patient Progress: The patient attended group but her participation was minimal. She was unable to follow the topic and/or give an appropriate answer.      Therapeutic Modalities:   Cognitive Behavioral Therapy Motivational Interviewing   Taeko Schaffer  CUEBAS-COLON, LCSW 12/06/2017 10:32 AM

## 2017-12-06 NOTE — Progress Notes (Signed)
Patient alert and oriented x 4, denies SI/HI/AVH, resting in bed no distress noted sitter at bedside.

## 2017-12-06 NOTE — Progress Notes (Signed)
Condition unchanged, sitter at bedside, no distress noted.

## 2017-12-06 NOTE — Tx Team (Signed)
Initial Treatment Plan 12/06/2017 5:14 AM Sylvia [name] ZOX:[id number]    PATIENT STRESSORS: Financial difficulties Medication change or noncompliance Substance abuse   PATIENT STRENGTHS: Motivation for treatment/growth Supportive family/friends   PATIENT IDENTIFIED PROBLEMS: Substance abuse     Psychosis     Seizures              DISCHARGE CRITERIA:  Improved stabilization in mood, thinking, and/or behavior Motivation to continue treatment in a less acute level of care  PRELIMINARY DISCHARGE PLAN: Attend 12-step recovery group Outpatient therapy  PATIENT/FAMILY INVOLVEMENT: This treatment plan has been presented to and reviewed with the patient, Sylvia [name],  The patient and family have been given the opportunity to ask questions and make suggestions.  Trula Ore, RN 12/06/2017, 5:14 AM

## 2017-12-06 NOTE — Plan of Care (Signed)
Emotional and mental status improving , Voice no concerns around sleep. Able to vent frustrations  with anger appropriately. Interacting with peers and staff  .  Remain free from injury  . Redirect  on information received given in concrete form for better understanding . Limited insight on resources assisting in health care needs  and lifestyle changes . Verbalize understanding of  medication received    Problem: Education: Goal: Emotional status will improve Outcome: Progressing Goal: Mental status will improve Outcome: Progressing   Problem: Activity: Goal: Sleeping patterns will improve Outcome: Progressing   Problem: Coping: Goal: Ability to verbalize frustrations and anger appropriately will improve Outcome: Progressing Goal: Ability to demonstrate self-control will improve Outcome: Progressing   Problem: Role Relationship: Goal: Ability to interact with others will improve Outcome: Progressing   Problem: Safety: Goal: Ability to redirect hostility and anger into socially appropriate behaviors will improve Outcome: Progressing Goal: Ability to remain free from injury will improve Outcome: Progressing   Problem: Safety: Goal: Ability to remain free from injury will improve Outcome: Progressing

## 2017-12-06 NOTE — Progress Notes (Signed)
Condition unchanged, sitter at bedside.  

## 2017-12-06 NOTE — Progress Notes (Signed)
Patient alert and oriented, sitter at bedside , patient was noted in the dayroom getting vitals, no distress noted.

## 2017-12-06 NOTE — Progress Notes (Signed)
Patient in dayroom with sitter no distress noted.

## 2017-12-06 NOTE — [facility] Suicide Risk Assessment (Signed)
[facility] Admission Suicide Risk Assessment   Nursing information obtained from:  Patient Demographic factors:  Living alone, Adolescent or young adult Current Mental Status:  NA Loss Factors:  Financial problems / change in socioeconomic status, Decline in physical health Historical Factors:  Impulsivity Risk Reduction Factors:  Religious beliefs about death  Total Time spent with patient: 1 hour Principal Problem: <principal problem not specified> Diagnosis:   Patient Active Problem List   Diagnosis Date Noted  . Schizoaffective disorder, bipolar type (HCC) [F25.0] 12/05/2017  . SIRS (systemic inflammatory response syndrome) (HCC) [R65.10] 12/02/2017  . Overdose [T50.901A] 12/02/2017  . Radius fracture [S52.90XA] 11/22/2017  . Substance induced mood disorder (HCC) [F19.94] 07/24/2016  . 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 Cocaine use disorder, moderate, dependence (HCC) [F14.20] 03/17/2015  . Cannabis use disorder, mild, abuse [F12.10] 03/17/2015  . Alcohol use disorder, severe, dependence (HCC) [F10.20] 03/17/2015  . Seizure disorder (HCC) [G40.909] 09/17/2013  . Cellulitis and abscess of toe of right foot [L03.031, L02.611] 09/16/2013  . Hepatitis C [B19.20]   . CP (cerebral palsy) (HCC) [G80.9]    Subjective Data:  Nobody helping me to call [employer]"  pt is 32 year old woman with a history of alcohol abuse cerebral palsy intellectual disability possible schizophrenia, admitted under IVC. Per report pt  who comes into the hospital because she took a overdose of multiple medicines. Family member reported that the patient had been intoxicated and took a full 7-day supply of all of her medications at once ,  made suicidal statements.  the patient also removed a cast that had recently been applied to her broken arm. Pt on 1:1, pt demanding to release her, focussed with contacting [employer] for her apartment. Pt  denies suicidal ideation at  this time, mood is labile and irritable. Speech is  rapid , pressured and disorganized. Patient lives with an aunt who is her closest relative and might be her guardian.   Continued Clinical Symptoms:  Alcohol Use Disorder Identification Test Final Score (AUDIT): 10 The "Alcohol Use Disorders Identification Test", Guidelines for Use in Primary Care, Second Edition.  World Science writer [facility]). Score between 0-7:  no or low risk or alcohol related problems. Score between 8-15:  moderate risk of alcohol related problems. Score between 16-19:  high risk of alcohol related problems. Score 20 or above:  warrants further diagnostic evaluation for alcohol dependence and treatment.   CLINICAL FACTORS:   anxiety, mood lability, h/o attempt, poor insight and impulse control   Musculoskeletal: Strength & Muscle Tone: within normal limits Gait & Station: normal Patient leans:   Psychiatric Specialty Exam: Physical Exam  Nursing note and vitals reviewed.   ROS  Blood pressure 116/84, pulse (!) 109, temperature 97.9 F (36.6 C), temperature source Oral, resp. rate 18, height 5\' 4"  (1.626 m), weight 58.5 kg, SpO2 100 %.Body mass index is 22.14 kg/m.  General Appearance:Disheveled  Eye Contact:None  Speech:pressured  Volume:Decreased  Mood:Dysphoric  Affect:labile  Thought Process:Disorganized  Orientation:Negative  Thought Content:Negative  Suicidal Thoughts:intermittent  Homicidal Thoughts:No  Memory:Immediate;Fair Recent;Poor Remote;Poor  Judgement:Impaired  Insight:Lacking  Psychomotor Activity:normal  Concentration:Concentration:Poor  Recall:Poor  Fund of Knowledge:Poor  Language:Poor  Akathisia:Negative  Handed:Right  AIMS (if indicated):   Assets:Housing Social Support  ADL's:Impaired  Cognition:Impaired,Moderate  Sleep: 4.75 hrs      COGNITIVE FEATURES THAT CONTRIBUTE TO RISK:   Closed-mindedness    SUICIDE RISK:   high  PLAN OF CARE:  Daily contact with patient to assess and evaluate  symptoms and progress in treatment and Medication management Pt with psychosis, schizoaffective d/o, intellectual disability, Cerebral palsy, alcohol use issues, presenting with mood lability, s/p OD on meds, self injurious behavior.   Plan - monitor SI Cont Risperdal 4mg  hs. Prn ativan for anxiety.  Cont 1:1.  IVC  Observation Level/Precautions:  1 to 1  Laboratory:  CBC Chemistry Profile HCG UDS UA  Psychotherapy:    Medications:    Consultations:    Discharge Concerns:    Estimated LOS:  Other:     Physician Treatment Plan for Primary Diagnosis: <principal problem not specified> Long Term Goal(s): Improvement in symptoms so as ready for discharge  Short Term Goals: Ability to identify changes in lifestyle to reduce recurrence of condition will improve, Ability to verbalize feelings will improve, Ability to disclose and discuss suicidal ideas, Ability to demonstrate self-control will improve, Ability to identify and develop effective coping behaviors will improve, Ability to maintain clinical measurements within normal limits will improve, Compliance with prescribed medications will improve and Ability to identify triggers associated with substance abuse/mental health issues will improve  Physician Treatment Plan for Secondary Diagnosis: Active Problems:   Schizoaffective disorder, bipolar type (HCC)  Long Term Goal(s): Improvement in symptoms so as ready for discharge  Short Term Goals: Ability to identify changes in lifestyle to reduce recurrence of condition will improve, Ability to verbalize feelings will improve, Ability to disclose and discuss suicidal ideas, Ability to demonstrate self-control will improve, Ability to identify and develop effective coping behaviors will improve, Ability to maintain clinical measurements within normal limits will improve,  Compliance with prescribed medications will improve and Ability to identify triggers associated with substance abuse/mental health issues will improve   I certify that inpatient services furnished can reasonably be expected to improve the patient's condition.   Beverly Sessions, MD 12/06/2017, 2:22 PM

## 2017-12-06 NOTE — Anesthesia Postprocedure Evaluation (Signed)
Anesthesia Post Note  Patient: Sylvia [name]  Procedure(s) Performed: OPEN REDUCTION INTERNAL FIXATION (ORIF) ULNAR FRACTURE (Left )  Patient location during evaluation: PACU Anesthesia Type: General Level of consciousness: awake and alert Pain management: pain level controlled Vital Signs Assessment: post-procedure vital signs reviewed and stable Respiratory status: spontaneous breathing, nonlabored ventilation, respiratory function stable and patient connected to nasal cannula oxygen Cardiovascular status: blood pressure returned to baseline and stable Postop Assessment: no apparent nausea or vomiting Anesthetic complications: no     Last Vitals:  Vitals:   12/05/17 1348 12/05/17 1840  BP: 110/85 110/89  Pulse: 82 87  Resp: 18 18  Temp: 36.6 C 36.5 C  SpO2: 100% 96%    Last Pain:  Vitals:   12/05/17 1840  TempSrc: Oral  PainSc:                  Lenard Simmer

## 2017-12-06 NOTE — H&P (Signed)
Psychiatric Admission Assessment Adult  Patient Identification: Sylvia [name] MRN:  [id number] Date of Evaluation:  12/06/2017 Chief Complaint:  schizophrenia Principal Diagnosis: <principal problem not specified> Diagnosis:   Patient Active Problem List   Diagnosis Date Noted  . Schizoaffective disorder, bipolar type ([place]) [F25.0] 12/05/2017  . SIRS (systemic inflammatory response syndrome) ([place]) [R65.10] 12/02/2017  . Overdose [T50.901A] 12/02/2017  . Radius fracture [S52.90XA] 11/22/2017  . Substance induced mood disorder ([place]) [F19.94] 07/24/2016  . 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 Cocaine use disorder, moderate, dependence ([place]) [F14.20] 03/17/2015  . Cannabis use disorder, mild, abuse [F12.10] 03/17/2015  . Alcohol use disorder, severe, dependence ([place]) [F10.20] 03/17/2015  . Seizure disorder ([place]) [V56.433] 09/17/2013  . Cellulitis and abscess of toe of right foot [L03.031, L02.611] 09/16/2013  . Hepatitis C [B19.20]   . CP (cerebral palsy) ([place]) [G80.9]    History of Present Illness:  Nobody helping me to call [employer]"   pt is 32 year old woman with a history of alcohol abuse cerebral palsy intellectual disability possible schizophrenia, admitted under IVC. Per report pt  who comes into the hospital because she took a overdose of multiple medicines.  Family member reported that the patient had been intoxicated and took a full 7-day supply of all of her medications at once ,  made suicidal statements.   the patient also removed a cast that had recently been applied to her broken arm.  Pt on 1:1, pt demanding to release her, focussed with contacting [employer] for her apartment. Pt  denies suicidal ideation at this time, mood is labile and irritable.  Speech is  rapid , pressured and disorganized. Patient lives with an aunt who is her closest relative and might be her guardian.  Medical history: recent Overdose .  History of  hepatitis C history of cerebral palsy recent fracture of her arm  Substance abuse history: Long-standing alcohol abuse with heavy complications although no history of DTs she does appear to have had alcohol withdrawal seizures.  Total Time spent with patient: 1 hour  Past Psychiatric History:   h/o  of schizophrenia although the patient in more recent times has not shown symptoms of schizophrenia so much as intellectual disability and chronic heavy alcohol abuse with cognitive impairment.  No  prior suicide attempts.   Is the patient at risk to self? Yes.    Has the patient been a risk to self in the past 6 months? Yes.    Has the patient been a risk to self within the distant past? Yes.    Is the patient a risk to others? No.  Has the patient been a risk to others in the past 6 months? No.  Has the patient been a risk to others within the distant past? No.   Prior Inpatient Therapy:   Prior Outpatient Therapy:    Alcohol Screening: 1. How often do you have a drink containing alcohol?: 2 to 4 times a month 2. How many drinks containing alcohol do you have on a typical day when you are drinking?: 5 or 6 3. How often do you have six or more drinks on one occasion?: Monthly AUDIT-C Score: 6 4. How often during the last year have you found that you were not able to stop drinking once you had started?: Less than monthly 5. How often during the last year have you failed to do what was normally expected from you becasue of drinking?: Monthly 6. How often during the last year  have you needed a first drink in the morning to get yourself going after a heavy drinking session?: Less than monthly 7. How often during the last year have you had a feeling of guilt of remorse after drinking?: Never 8. How often during the last year have you been unable to remember what happened the night before because you had been drinking?: Never 9. Have you or someone else been injured as a result of your drinking?:  No 10. Has a relative or friend or a doctor or another health worker been concerned about your drinking or suggested you cut down?: No Alcohol Use Disorder Identification Test Final Score (AUDIT): 10 Intervention/Follow-up: Alcohol Education Substance Abuse History in the last 12 months:  Yes.  , alcohol Consequences of Substance Abuse: Medical Consequences:  seizure Previous Psychotropic Medications: Yes  Psychological Evaluations:  Past Medical History:  Past Medical History:  Diagnosis Date  . Alcoholism ([place])   . CP (cerebral palsy) ([place])   . Hepatitis C   . Herpes   . Polysubstance abuse ([place])   . Schizoaffective schizophrenia ([place]) 09/17/2013   Per [facility] from [facility] as on 2014   . Seizures ([place])   . Stroke [facility])     Past Surgical History:  Procedure Laterality Date  . 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 FOOT SURGERY     rt and left  . LEG SURGERY Right    x7  . OPEN REDUCTION INTERNAL FIXATION (ORIF) DISTAL RADIAL FRACTURE Left 11/22/2017   Procedure: OPEN REDUCTION INTERNAL FIXATION (ORIF) DISTAL RADIAL FRACTURE and carpal tunnel release;  Surgeon: Thornton Park, MD;  Location: [facility] ORS;  Service: Orthopedics;  Laterality: Left;  . ORIF ULNAR FRACTURE Left 12/04/2017   Procedure: OPEN REDUCTION INTERNAL FIXATION (ORIF) ULNAR FRACTURE;  Surgeon: Thornton Park, MD;  Location: [facility] ORS;  Service: Orthopedics;  Laterality: Left;   Family History:  Family History  Problem Relation Age of Onset  . Bipolar disorder Mother   . Cancer Maternal Grandmother   . Diabetes Maternal Grandfather   . Hypertension Maternal Grandfather    Family Psychiatric  History: mother- bipolar Tobacco Screening: Have you used any form of tobacco in the last 30 days? (Cigarettes, Smokeless Tobacco, Cigars, and/or Pipes): Yes Tobacco use, Select all  that apply: 5 or more cigarettes per day Are you interested in Tobacco Cessation Medications?: Yes, will notify MD for an order Counseled patient on smoking cessation including recognizing danger situations, developing coping skills and basic information about quitting provided: Yes Social History:  Social History   Substance and Sexual Activity  Alcohol Use Yes  . Alcohol/week: 3.0 standard drinks  . Types: 3 Cans of beer per week   Comment: Per patient 0.5 gallon to gallon when she can but since being in group home she can't do it as often      Social History   Substance and Sexual Activity  Drug Use Yes  . Types: "Crack" cocaine, Marijuana, Cocaine   Comment: Per patient uses herion and crack and cocaine "once in a blue moon"    Additional Social History:   Patient lives with an aunt who is her closest relative and might be her guardian                         Allergies:   Allergies  Allergen Reactions  . Diphenhydramine Hcl Rash    Reaction:  Lowers Dilantin level   . Fluoxetine Other (See Comments)    Reaction:  Altered mental status   . Baclofen Other (See Comments)    Drops blood pressure, per pt  . Depakote [Divalproex Sodium] Other (See Comments)    Reaction:  Seizures and drops pts BP  . Quetiapine Other (See Comments)    Reaction:  Drops pts BP   Lab Results:  Results for orders placed or performed during the hospital encounter of 12/05/17 (from the past 48 hour(s))  Lipid panel     Status: None   Collection Time: 12/06/17  6:34 AM  Result Value Ref Range   Cholesterol 179 0 - 200 mg/dL   Triglycerides 122 <150 mg/dL   HDL 62 >40 mg/dL   Total CHOL/HDL Ratio 2.9 RATIO   VLDL 24 0 - 40 mg/dL   LDL Cholesterol 93 0 - 99 mg/dL    Comment:        Total Cholesterol/HDL:CHD Risk Coronary Heart Disease Risk Table                     Men   Women  1/2 Average Risk   3.4   3.3  Average Risk       5.0   4.4  2 X Average Risk   9.6   7.1  3 X Average  Risk  23.4   11.0        Use the calculated Patient Ratio above and the CHD Risk Table to determine the patient's CHD Risk.        ATP III CLASSIFICATION (LDL):  <100     mg/dL   Optimal  100-129  mg/dL   Near or Above                    Optimal  130-159  mg/dL   Borderline  160-189  mg/dL   High  >190     mg/dL   Very High Performed at [facility], [address] [place], [place] [address]   TSH     Status: None   Collection Time: 12/06/17  6:34 AM  Result Value Ref Range   TSH 2.376 0.350 - 4.500 uIU/mL    Comment: Performed by a 3rd Generation assay with a functional sensitivity of <=0.01 uIU/mL. Performed at [facility], [address] [place], [place] [address]   Basic metabolic panel     Status: None   Collection Time: 12/06/17  6:34 AM  Result Value Ref Range   Sodium 139 135 - 145 mmol/L   Potassium 3.9 3.5 - 5.1 mmol/L   Chloride 103 98 - 111 mmol/L   CO2 26 22 - 32 mmol/L   Glucose, Bld 97 70 - 99 mg/dL   BUN 10 6 - 20 mg/dL   Creatinine, Ser 0.79 0.44 - 1.00 mg/dL   Calcium 8.9 8.9 - 10.3 mg/dL   GFR calc non Af Amer >60 >60 mL/min   GFR calc Af Amer >60 >60 mL/min    Comment: (NOTE) The eGFR has been calculated using the CKD EPI equation. This calculation has not been validated in all clinical situations. eGFR's persistently <60 mL/min signify possible Chronic Kidney Disease.    Anion gap 10 5 - 15    Comment: Performed at [facility], [address] [place], [place] [address]  CBC     Status: Abnormal  Collection Time: 12/06/17  6:34 AM  Result Value Ref Range   WBC 7.0 4.0 - 10.5 K/uL   RBC 3.56 (L) 3.87 - 5.11 MIL/uL   Hemoglobin 11.6 (L) 12.0 - 15.0 g/dL   HCT 36.2 36.0 - 46.0 %   MCV 101.7 (H) 80.0 - 100.0 fL   MCH 32.6 26.0 - 34.0 pg   MCHC 32.0 30.0 - 36.0 g/dL   RDW 13.4 11.5 - 15.5 %   Platelets 233 150 - 400 K/uL   nRBC 0.0 0.0 - 0.2 %    Comment: Performed at [facility], [address] [place], [place] [address]    Blood Alcohol level:  Lab Results  Component Value Date   ETH 67 (H) 12/02/2017   ETH <10 57/02/7791    Metabolic Disorder Labs:  Lab Results  Component Value Date   HGBA1C 5.3 03/18/2015   MPG 105 03/18/2015   Lab Results  Component Value Date   PROLACTIN 68.5 (H) 03/18/2015   Lab Results  Component Value Date   CHOL 179 12/06/2017   TRIG 122 12/06/2017   HDL 62 12/06/2017   CHOLHDL 2.9 12/06/2017   VLDL 24 12/06/2017   LDLCALC 93 12/06/2017   LDLCALC 94 03/18/2015    Current Medications: Current Facility-Administered Medications  Medication Dose Route Frequency Provider Last Rate Last Dose  . acetaminophen (TYLENOL) tablet 650 mg  650 mg Oral Q6H PRN Clapacs, John T, MD      . alum & mag hydroxide-simeth (MAALOX/MYLANTA) 200-200-20 MG/5ML suspension 30 mL  30 mL Oral Q4H PRN Clapacs, John T, MD      . benztropine (COGENTIN) tablet 0.5 mg  0.5 mg Oral QHS Clapacs, John T, MD   0.5 mg at 12/05/17 2304  . cholecalciferol (VITAMIN D) tablet 1,000 Units  1,000 Units Oral Daily Clapacs, Madie Reno, MD   1,000 Units at 12/06/17 0840  . docusate sodium (COLACE) capsule 100 mg  100 mg Oral BID Clapacs, John T, MD      . folic acid (FOLVITE) tablet 1 mg  1 mg Oral Daily Clapacs, Madie Reno, MD   1 mg at 12/06/17 0840  . gabapentin (NEURONTIN) capsule 300 mg  300 mg Oral BID Clapacs, Madie Reno, MD   300 mg at 12/06/17 0840  . hydrOXYzine (ATARAX/VISTARIL) tablet 50 mg  50 mg Oral TID Clapacs, Madie Reno, MD   50 mg at 12/06/17 1219  . levETIRAcetam (KEPPRA) tablet 250 mg  250 mg Oral BID Clapacs, Madie Reno, MD   250 mg at 12/06/17 0840  . magnesium hydroxide (MILK OF MAGNESIA) suspension 30 mL  30 mL Oral Daily PRN Clapacs, John T, MD      . methocarbamol (ROBAXIN) tablet 500 mg  500 mg Oral Q6H PRN Clapacs, John T, MD   500 mg at 12/05/17 2305   Or  . methocarbamol (ROBAXIN) 500 mg in dextrose 5 % 50 mL IVPB  500 mg Intravenous Q6H PRN Clapacs, John T, MD      .  multivitamin with minerals tablet 1 tablet  1 tablet Oral Daily Clapacs, Madie Reno, MD   1 tablet at 12/06/17 0840  . nicotine (NICODERM CQ - dosed in mg/24 hours) patch 21 mg  21 mg Transdermal Daily Clapacs, Madie Reno, MD   21 mg at 12/06/17 0840  . ondansetron (ZOFRAN) tablet 4 mg  4 mg Oral Q6H PRN Clapacs, Madie Reno, MD       Or  . ondansetron (  ZOFRAN) injection 4 mg  4 mg Intravenous Q6H PRN Clapacs, John T, MD      . risperiDONE (RISPERDAL M-TABS) disintegrating tablet 4 mg  4 mg Oral QHS Clapacs, Madie Reno, MD   4 mg at 12/05/17 2305  . thiamine (VITAMIN B-1) tablet 100 mg  100 mg Oral Daily Clapacs, John T, MD   100 mg at 12/06/17 0840   Or  . thiamine (B-1) injection 100 mg  100 mg Intravenous Daily Clapacs, Madie Reno, MD       PTA Medications: Medications Prior to Admission  Medication Sig Dispense Refill Last Dose  . ARIPiprazole (ABILIFY) 5 MG tablet Take 5 mg by mouth daily.   unknown at unknown  . benztropine (COGENTIN) 0.5 MG tablet Take 0.5 mg by mouth at bedtime.   Not Taking at Unknown time  . cholecalciferol (VITAMIN D) 1000 units tablet Take 1,000 Units by mouth daily.   unknown at unknown  . citalopram (CELEXA) 20 MG tablet Take 20 mg by mouth daily.   Not Taking at Unknown time  . docusate sodium (COLACE) 100 MG capsule Take 1 capsule (100 mg total) by mouth 2 (two) times daily. 10 capsule 0   . FLUoxetine (PROZAC) 20 MG capsule Take 20 mg by mouth daily.   unknown at unknown  . gabapentin (NEURONTIN) 300 MG capsule Take 300 mg by mouth 2 (two) times daily.    at unknown  . hydrOXYzine (ATARAX/VISTARIL) 50 MG tablet Take 50 mg by mouth 3 (three) times daily.   unknown at unknown  . levETIRAcetam (KEPPRA) 250 MG tablet Take 250 mg by mouth at bedtime.   unknown at unknown  . nicotine (NICODERM CQ - DOSED IN MG/24 HOURS) 21 mg/24hr patch Place 1 patch (21 mg total) onto the skin daily. 28 patch 0   . oxyCODONE (OXY IR/ROXICODONE) 5 MG immediate release tablet Take 1 tablet (5 mg total)  by mouth every 4 (four) hours as needed for moderate pain or severe pain. 30 tablet 0   . risperiDONE (RISPERDAL) 3 MG tablet Take 3 mg by mouth 2 (two) times daily.   unknown at unknown    Musculoskeletal: Strength & Muscle Tone: within normal limits Gait & Station: normal Patient leans:   Psychiatric Specialty Exam: Physical Exam  Nursing note and vitals reviewed.   ROS  Blood pressure 116/84, pulse (!) 109, temperature 97.9 F (36.6 C), temperature source Oral, resp. rate 18, height '5\' 4"'  (1.626 m), weight 58.5 kg, SpO2 100 %.Body mass index is 22.14 kg/m.  General Appearance: Disheveled  Eye Contact:  None  Speech:  pressured  Volume:  Decreased  Mood:  Dysphoric  Affect:  labile  Thought Process:  Disorganized  Orientation:  Negative  Thought Content:  Negative  Suicidal Thoughts:  intermittent  Homicidal Thoughts:  No  Memory:  Immediate;   Fair Recent;   Poor Remote;   Poor  Judgement:  Impaired  Insight:  Lacking  Psychomotor Activity:  normal  Concentration:  Concentration: Poor  Recall:  Poor  Fund of Knowledge:  Poor  Language:  Poor  Akathisia:  Negative  Handed:  Right  AIMS (if indicated):     Assets:  Housing Social Support  ADL's:  Impaired  Cognition:  Impaired,  Moderate  Sleep:        Treatment Plan Summary: Daily contact with patient to assess and evaluate symptoms and progress in treatment and Medication management Pt with psychosis, schizoaffective d/o, intellectual disability, Cerebral palsy,  alcohol use issues, presenting with mood lability, s/p OD on meds, self injurious behavior.   Plan - Cont Risperdal 36m hs. Prn ativan for anxiety.  Cont 1:1.  IVC  Observation Level/Precautions:  1 to 1  Laboratory:  CBC Chemistry Profile HCG UDS UA  Psychotherapy:    Medications:    Consultations:    Discharge Concerns:    Estimated LOS:  Other:     Physician Treatment Plan for Primary Diagnosis: <principal problem not  specified> Long Term Goal(s): Improvement in symptoms so as ready for discharge  Short Term Goals: Ability to identify changes in lifestyle to reduce recurrence of condition will improve, Ability to verbalize feelings will improve, Ability to disclose and discuss suicidal ideas, Ability to demonstrate self-control will improve, Ability to identify and develop effective coping behaviors will improve, Ability to maintain clinical measurements within normal limits will improve, Compliance with prescribed medications will improve and Ability to identify triggers associated with substance abuse/mental health issues will improve  Physician Treatment Plan for Secondary Diagnosis: Active Problems:   Schizoaffective disorder, bipolar type ([place]  Long Term Goal(s): Improvement in symptoms so as ready for discharge  Short Term Goals: Ability to identify changes in lifestyle to reduce recurrence of condition will improve, Ability to verbalize feelings will improve, Ability to disclose and discuss suicidal ideas, Ability to demonstrate self-control will improve, Ability to identify and develop effective coping behaviors will improve, Ability to maintain clinical measurements within normal limits will improve, Compliance with prescribed medications will improve and Ability to identify triggers associated with substance abuse/mental health issues will improve  I certify that inpatient services furnished can reasonably be expected to improve the patient's condition.    JLenward Chancellor MD 10/19/20192:00 PM

## 2017-12-06 NOTE — Progress Notes (Signed)
Patient alert and oriented x 4, safety sitter at bedside no distress noted, arm in a sling and elevated will continue to monitor.

## 2017-12-06 NOTE — Progress Notes (Signed)
Patient is on 1:1 no distress noted, affect is blunted and angry, very demanding and hostile to sitter, patient was advised to patient with sitter and be polite to her, no distress noted.

## 2017-12-06 NOTE — Progress Notes (Signed)
D: Patient stated slept good last night .Stated appetite is good and energy level  normal. Stated concentration poor . Stated on Depression scale  5, hopeless 7 and anxiety 7.( low 0-10 high) Denies suicidal  homicidal ideations  .  No auditory hallucinations  . Appropriate ADL'S. Interacting with peers and staff. Emotional and mental status improving , Voice no concerns around sleep. Able to vent frustrations  with anger appropriately. Interacting with peers and staff  .  Remain free from injury  . Redirect  on information received given in concrete form for better understanding . Limited insight on resources assisting in health care needs  and lifestyle changes . Verbalize understanding of  medication received  Thought process remain altered   A: Encourage patient participation with unit programming . Instruction  Given on  Medication , verbalize understanding.  R: Voice no other concerns. Staff continue to monitor

## 2017-12-06 NOTE — Progress Notes (Signed)
Patient's condition unchanged no distress noted, affect is flat but brightens upon approach.

## 2017-12-06 NOTE — Plan of Care (Signed)
  Problem: Coping: Goal: Ability to demonstrate self-control will improve Outcome: Progressing  Patient able to demonstrate self control and restrain

## 2017-12-06 NOTE — Progress Notes (Signed)
Admission Note:  32 year old female who presents IVC in some acute distress for the treatment of psychosis and substance abuse. Pt appears anxious, upset, and angry, she had a safety sitter because she has a fresh cast on her left wrist; Vascular checks dome capillary refill less than 3 seconds, able to move fingers, skin appear warm to touch, no swelling, arm in a sling, ice applied as ordered, and extremity raised. No distress noted.  Patient eventually was calm and cooperative with admission process, patient stated  " l am here because of SI"  Patient currently denies SI/HI/AVH and contracts for safety upon admission. Patient stated she often abuse cocaine and are mood has been more irritable. Patient has Past medical Hx of schizophrenia, cellulitis of toe of right foot, Seizure, cerebral palsy, alcohol dependence, hepatitis, substance abuse, hx of fractures, and  overdose. Patient's skin was assessed and found to be warm to touch, patient has no abnormal marks, patient has a old scar on her left foot. Patient was searched for contraband none found, POC and unit policies explained and understanding verbalized, consents obtained, offered food and fluids both accepted . Patient has a Recruitment consultant at bedside because of her erratic behavior, and her trying to put things in the cast. 15  Minutes safety safety checks maintained will continue to monitor.

## 2017-12-06 NOTE — Progress Notes (Signed)
Patient in bed with eyes closed, sitter at bedside no distress noted will continue to monitor.  

## 2017-12-06 NOTE — Progress Notes (Signed)
Patient at bedside, no distress noted, noted lying in bed with eyes closed , sitter at bedside.

## 2017-12-07 ENCOUNTER — Encounter: Payer: Self-pay | Admitting: Psychiatry

## 2017-12-07 ENCOUNTER — Other Ambulatory Visit: Payer: Self-pay

## 2017-12-07 DIAGNOSIS — F17200 Nicotine dependence, unspecified, uncomplicated: Secondary | ICD-10-CM | POA: Diagnosis present

## 2017-12-07 LAB — BASIC METABOLIC PANEL
Anion gap: 8 (ref 5–15)
BUN: 8 mg/dL (ref 6–20)
CHLORIDE: 103 mmol/L (ref 98–111)
CO2: 28 mmol/L (ref 22–32)
CREATININE: 0.78 mg/dL (ref 0.44–1.00)
Calcium: 9.3 mg/dL (ref 8.9–10.3)
GFR calc Af Amer: >60 mL/min (ref >60)
GFR calc non Af Amer: >60 mL/min (ref >60)
GLUCOSE: 107 mg/dL — AB (ref 70–99)
POTASSIUM: 4.4 mmol/L (ref 3.5–5.1)
SODIUM: 139 mmol/L (ref 135–145)

## 2017-12-07 LAB — CBC
HEMATOCRIT: 35 % — AB (ref 36.0–46.0)
HEMOGLOBIN: 11.5 g/dL — AB (ref 12.0–15.0)
MCH: 33.1 pg (ref 26.0–34.0)
MCHC: 32.9 g/dL (ref 30.0–36.0)
MCV: 100.9 fL — ABNORMAL HIGH (ref 80.0–100.0)
Platelets: 250 10*3/uL (ref 150–400)
RBC: 3.47 MIL/uL — ABNORMAL LOW (ref 3.87–5.11)
RDW: 13.2 % (ref 11.5–15.5)
WBC: 5.3 10*3/uL (ref 4.0–10.5)
nRBC: 0 % (ref 0.0–0.2)

## 2017-12-07 LAB — CULTURE, BLOOD (ROUTINE X 2)
CULTURE: NO GROWTH
Culture: NO GROWTH
SPECIAL REQUESTS: ADEQUATE
SPECIAL REQUESTS: ADEQUATE

## 2017-12-07 MED ORDER — BENZTROPINE MESYLATE 1 MG PO TABS
0.5000 mg | ORAL_TABLET | Freq: Two times a day (BID) | ORAL | Status: DC
  Administered 2017-12-07 – 2017-12-09 (×5): 0.5 mg via ORAL
  Filled 2017-12-07 (×5): qty 1

## 2017-12-07 MED ORDER — MAGNESIUM HYDROXIDE 400 MG/5ML PO SUSP: 15.0000 mL | Freq: Every day | ORAL | Status: DC | PRN

## 2017-12-07 NOTE — Progress Notes (Signed)
Patient in bed sleeping. Staff continue to monitor on 1:1. 

## 2017-12-07 NOTE — Progress Notes (Signed)
Patient was in the dayroom at the beginning of this shift. Was cooperative and talking to staff and peers. She then directed herself to her room with the sitter. Pleasant and cooperative but experiencing disturbed thought process: incoherent with disorganized thinking. No other concerns. Was encouraged to talk to staff and express her concerns as needed. Safety maintained on 1:1.

## 2017-12-07 NOTE — Plan of Care (Signed)
Patient remains on 1:1 for safety. Hard cast to L) arm that patient removed in attempt to bathe. Inside of casting got wet, this writer obtained new insert for cast. Skin warm to touch, normal skin color. Denies self harming thoughts as well as HI. Compliant with meals and medications. Ambulates the unit with semi-unsteady gait. Sitter at her side, milieu remains safe at this time.

## 2017-12-07 NOTE — Progress Notes (Signed)
Patient found in the bathroom sitting on the commode. Reports that she is unable to have a bowel movement. However, the sitter reports that patient had a large bowel movement earlier. Patient continues to display disorganized thinking and behaviors, but pleasant.  Received her bedtime medications. Staff continue to provide support and encouragements. Safety monitored on 1:1.

## 2017-12-07 NOTE — Plan of Care (Signed)
Thought process disorganized. Anxious. No evidence of learning upon education.

## 2017-12-07 NOTE — Progress Notes (Signed)
Patient in the dayroom with staff and peers. Pleasant and cooperative. Thought process improved. Patient is alert and oriented and able to express her needs. No sign of distress. Remains on 1:1 for safety.

## 2017-12-07 NOTE — Progress Notes (Addendum)
[facility] MD Progress Note  12/07/2017 10:58 AM Sylvia [name]  MRN:  [id number] Subjective:   pt is 32 year old woman with a history of alcohol abuse cerebral palsy intellectual disability possible schizophrenia, admitted under IVC. Per report pt  who comes into the hospital because she took a overdose of multiple medicines. Family member reported that the patient had been intoxicated and took a full 7-day supply of all of her medications at once ,  made suicidal statements.  the patient also removed a cast that had recently been applied to her broken arm. Pt on 1:1,  I am not having good bowel movement" Pt seen, chart reviewed, pt on 1:1. Pt anxious, restless, guarded, suspicious, pt needed prn vistaril for anxiety, pt asking sth for constipation. Denies SI. Reports AH telling her " be quite, try harder".  Principal Problem: <principal problem not specified> Diagnosis:   Patient Active Problem List   Diagnosis Date Noted  . Schizoaffective disorder, bipolar type ([place]) [F25.0] 12/05/2017  . SIRS (systemic inflammatory response syndrome) ([place]) [R65.10] 12/02/2017  . Overdose [T50.901A] 12/02/2017  . Radius fracture [S52.90XA] 11/22/2017  . Substance induced mood disorder ([place]) [F19.94] 07/24/2016  . 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 Cocaine use disorder, moderate, dependence ([place]) [F14.20] 03/17/2015  . Cannabis use disorder, mild, abuse [F12.10] 03/17/2015  . Alcohol use disorder, severe, dependence ([place]) [F10.20] 03/17/2015  . Seizure disorder ([place]) [O13.086] 09/17/2013  . Cellulitis and abscess of toe of right foot [L03.031, L02.611] 09/16/2013  . Hepatitis C [B19.20]   . CP (cerebral palsy) ([place]) [G80.9]    Total Time spent with patient: 25 min  Past Psychiatric History: see H & P  Past Medical History:  Past Medical History:  Diagnosis Date  . Alcoholism ([place])   . CP (cerebral palsy) ([place])   . Hepatitis C   . Herpes   . Polysubstance abuse ([place])   .  Schizoaffective schizophrenia ([place]) 09/17/2013   Per notes from [facility] as on 2014   . Seizures ([place])   . Stroke [facility])     Past Surgical History:  Procedure Laterality Date  . 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 FOOT SURGERY     rt and left  . LEG SURGERY Right    x7  . OPEN REDUCTION INTERNAL FIXATION (ORIF) DISTAL RADIAL FRACTURE Left 11/22/2017   Procedure: OPEN REDUCTION INTERNAL FIXATION (ORIF) DISTAL RADIAL FRACTURE and carpal tunnel release;  Surgeon: Thornton Park, MD;  Location: [facility] ORS;  Service: Orthopedics;  Laterality: Left;  . ORIF ULNAR FRACTURE Left 12/04/2017   Procedure: OPEN REDUCTION INTERNAL FIXATION (ORIF) ULNAR FRACTURE;  Surgeon: Thornton Park, MD;  Location: [facility] ORS;  Service: Orthopedics;  Laterality: Left;   Family History:  Family History  Problem Relation Age of Onset  . Bipolar disorder Mother   . Cancer Maternal Grandmother   . Diabetes Maternal Grandfather   . Hypertension Maternal Grandfather    Family Psychiatric  History: see H & P Social History:  Social History   Substance and Sexual Activity  Alcohol Use Yes  . Alcohol/week: 3.0 standard drinks  . Types: 3 Cans of beer per week   Comment: Per patient 0.5 gallon to gallon when she can but since being in group home she can't do it as often      Social History   Substance and Sexual Activity  Drug Use Yes  . Types: "Crack" cocaine, Marijuana, Cocaine   Comment: Per patient uses herion and crack and cocaine "once in a blue moon"    Social History   Socioeconomic History  . Marital status: Single    Spouse name: Not on file  . Number of children: Not on file  . Years of education: Not on file  . Highest education level: Not on file  Occupational History  . Not on file  Social Needs  . Financial resource strain: Not on file  . Food  insecurity:    Worry: Not on file    Inability: Not on file  . Transportation needs:    Medical: Not on file    Non-medical: Not on file  Tobacco Use  . Smoking status: Current Every Day Smoker    Packs/day: 1.00    Years: 20.00    Pack years: 20.00    Types: Cigarettes  . Smokeless tobacco: Never Used  Substance and Sexual Activity  . Alcohol use: Yes    Alcohol/week: 3.0 standard drinks    Types: 3 Cans of beer per week    Comment: Per patient 0.5 gallon to gallon when she can but since being in group home she can't do it as often   . Drug use: Yes    Types: "Crack" cocaine, Marijuana, Cocaine    Comment: Per patient uses herion and crack and cocaine "once in a blue moon"  . Sexual activity: Not Currently    Birth control/protection: Implant  Lifestyle  . Physical activity:    Days per week: Not on file    Minutes per session: Not on file  . Stress: Not on file  Relationships  . Social connections:    Talks on phone: Not on file    Gets together: Not on file    Attends religious service: Not on file    Active member of club or organization: Not on file    Attends meetings of clubs or organizations: Not on file    Relationship status: Not on file  Other Topics Concern  . Not on file  Social History Narrative   Lives at home by herself, only has acquaintances here   Does not work    Additional Social History:                         Sleep: Fair  Appetite:  Fair  Current Medications: Current Facility-Administered Medications  Medication Dose Route Frequency Provider Last Rate Last Dose  . acetaminophen (TYLENOL) tablet 650 mg  650 mg Oral Q6H PRN Clapacs, Madie Reno, MD   650 mg at 12/07/17 0815  . alum & mag hydroxide-simeth (MAALOX/MYLANTA) 200-200-20 MG/5ML suspension 30 mL  30 mL Oral Q4H PRN Clapacs, John T, MD      . benztropine (COGENTIN) tablet 0.5 mg  0.5 mg Oral QHS Clapacs, John T, MD   0.5 mg at 12/06/17 2159  . cholecalciferol (VITAMIN D)  tablet 1,000 Units  1,000 Units Oral Daily Clapacs, Madie Reno, MD   1,000 Units at 12/07/17 [phone]  . docusate sodium (COLACE) capsule 100 mg  100 mg Oral BID Clapacs, Madie Reno, MD   100 mg at 12/07/17 0814  . folic acid (FOLVITE) tablet 1 mg  1 mg Oral Daily Clapacs, Madie Reno, MD   1 mg at 12/07/17 0815  . gabapentin (NEURONTIN) capsule 300 mg  300 mg Oral BID Clapacs, Madie Reno, MD  300 mg at 12/07/17 0814  . hydrOXYzine (ATARAX/VISTARIL) tablet 50 mg  50 mg Oral TID Clapacs, Madie Reno, MD   50 mg at 12/07/17 0814  . levETIRAcetam (KEPPRA) tablet 250 mg  250 mg Oral BID Clapacs, Madie Reno, MD   250 mg at 12/07/17 6314  . LORazepam (ATIVAN) tablet 1 mg  1 mg Oral TID PRN Lenward Chancellor, MD      . magnesium hydroxide (MILK OF MAGNESIA) suspension 30 mL  30 mL Oral Daily PRN Clapacs, John T, MD   30 mL at 12/07/17 1013  . methocarbamol (ROBAXIN) tablet 500 mg  500 mg Oral Q6H PRN Clapacs, Madie Reno, MD   500 mg at 12/05/17 2305   Or  . methocarbamol (ROBAXIN) 500 mg in dextrose 5 % 50 mL IVPB  500 mg Intravenous Q6H PRN Clapacs, John T, MD      . multivitamin with minerals tablet 1 tablet  1 tablet Oral Daily Clapacs, John T, MD   1 tablet at 12/07/17 0815  . nicotine (NICODERM CQ - dosed in mg/24 hours) patch 21 mg  21 mg Transdermal Daily Clapacs, Madie Reno, MD   21 mg at 12/07/17 0815  . ondansetron (ZOFRAN) tablet 4 mg  4 mg Oral Q6H PRN Clapacs, John T, MD       Or  . ondansetron (ZOFRAN) injection 4 mg  4 mg Intravenous Q6H PRN Clapacs, John T, MD      . risperiDONE (RISPERDAL M-TABS) disintegrating tablet 4 mg  4 mg Oral QHS Clapacs, Madie Reno, MD   4 mg at 12/06/17 2159  . thiamine (VITAMIN B-1) tablet 100 mg  100 mg Oral Daily Clapacs, Madie Reno, MD   100 mg at 12/07/17 9702   Or  . thiamine (B-1) injection 100 mg  100 mg Intravenous Daily Clapacs, Madie Reno, MD      . traZODone (DESYREL) tablet 50 mg  50 mg Oral QHS Lenward Chancellor, MD   50 mg at 12/06/17 2159    Lab Results:  Results for orders placed or  performed during the hospital encounter of 12/05/17 (from the past 48 hour(s))  Hemoglobin A1c     Status: None   Collection Time: 12/06/17  6:34 AM  Result Value Ref Range   Hgb A1c MFr Bld 5.1 4.8 - 5.6 %    Comment: (NOTE) Pre diabetes:          5.7%-6.4% Diabetes:              >6.4% Glycemic control for   <7.0% adults with diabetes    Mean Plasma Glucose 99.67 mg/dL    Comment: Performed at [facility] [address] [place], [place] [address]  Lipid panel     Status: None   Collection Time: 12/06/17  6:34 AM  Result Value Ref Range   Cholesterol 179 0 - 200 mg/dL   Triglycerides 122 <150 mg/dL   HDL 62 >40 mg/dL   Total CHOL/HDL Ratio 2.9 RATIO   VLDL 24 0 - 40 mg/dL   LDL Cholesterol 93 0 - 99 mg/dL    Comment:        Total Cholesterol/HDL:CHD Risk Coronary Heart Disease Risk Table                     Men   Women  1/2 Average Risk   3.4   3.3  Average Risk       5.0   4.4  2 X Average Risk   9.6   7.1  3 X Average Risk  23.4   11.0        Use the calculated Patient Ratio above and the CHD Risk Table to determine the patient's CHD Risk.        ATP III CLASSIFICATION (LDL):  <100     mg/dL   Optimal  100-129  mg/dL   Near or Above                    Optimal  130-159  mg/dL   Borderline  160-189  mg/dL   High  >190     mg/dL   Very High Performed at [facility], [address] [place], [place] [address]   TSH     Status: None   Collection Time: 12/06/17  6:34 AM  Result Value Ref Range   TSH 2.376 0.350 - 4.500 uIU/mL    Comment: Performed by a 3rd Generation assay with a functional sensitivity of <=0.01 uIU/mL. Performed at [facility], [address] [place], [place] [address]   Basic metabolic panel     Status: None   Collection Time: 12/06/17  6:34 AM  Result Value Ref Range   Sodium 139 135 - 145 mmol/L   Potassium 3.9 3.5 - 5.1 mmol/L   Chloride 103 98 - 111 mmol/L   CO2 26 22 - 32 mmol/L   Glucose, Bld 97 70 - 99  mg/dL   BUN 10 6 - 20 mg/dL   Creatinine, Ser 0.79 0.44 - 1.00 mg/dL   Calcium 8.9 8.9 - 10.3 mg/dL   GFR calc non Af Amer >60 >60 mL/min   GFR calc Af Amer >60 >60 mL/min    Comment: (NOTE) The eGFR has been calculated using the CKD EPI equation. This calculation has not been validated in all clinical situations. eGFR's persistently <60 mL/min signify possible Chronic Kidney Disease.    Anion gap 10 5 - 15    Comment: Performed at [facility], [address] [place], [place] [address]  CBC     Status: Abnormal   Collection Time: 12/06/17  6:34 AM  Result Value Ref Range   WBC 7.0 4.0 - 10.5 K/uL   RBC 3.56 (L) 3.87 - 5.11 MIL/uL   Hemoglobin 11.6 (L) 12.0 - 15.0 g/dL   HCT 36.2 36.0 - 46.0 %   MCV 101.7 (H) 80.0 - 100.0 fL   MCH 32.6 26.0 - 34.0 pg   MCHC 32.0 30.0 - 36.0 g/dL   RDW 13.4 11.5 - 15.5 %   Platelets 233 150 - 400 K/uL   nRBC 0.0 0.0 - 0.2 %    Comment: Performed at [facility], [address] [place], [place] [address]  Basic metabolic panel     Status: Abnormal   Collection Time: 12/07/17  6:34 AM  Result Value Ref Range   Sodium 139 135 - 145 mmol/L   Potassium 4.4 3.5 - 5.1 mmol/L   Chloride 103 98 - 111 mmol/L   CO2 28 22 - 32 mmol/L   Glucose, Bld 107 (H) 70 - 99 mg/dL   BUN 8 6 - 20 mg/dL   Creatinine, Ser 0.78 0.44 - 1.00 mg/dL   Calcium 9.3 8.9 - 10.3 mg/dL   GFR calc non Af Amer >60 >60 mL/min   GFR calc Af Amer >60 >60 mL/min    Comment: (NOTE) The eGFR has been calculated using the  CKD EPI equation. This calculation has not been validated in all clinical situations. eGFR's persistently <60 mL/min signify possible Chronic Kidney Disease.    Anion gap 8 5 - 15    Comment: Performed at [facility], [address] [place], [place] [address]  CBC     Status: Abnormal   Collection Time: 12/07/17  6:34 AM  Result Value Ref Range   WBC 5.3 4.0 - 10.5 K/uL   RBC 3.47 (L) 3.87 - 5.11 MIL/uL   Hemoglobin 11.5 (L)  12.0 - 15.0 g/dL   HCT 35.0 (L) 36.0 - 46.0 %   MCV 100.9 (H) 80.0 - 100.0 fL   MCH 33.1 26.0 - 34.0 pg   MCHC 32.9 30.0 - 36.0 g/dL   RDW 13.2 11.5 - 15.5 %   Platelets 250 150 - 400 K/uL   nRBC 0.0 0.0 - 0.2 %    Comment: Performed at [facility], [address] [place], [place] [address]    Blood Alcohol level:  Lab Results  Component Value Date   ETH 67 (H) 12/02/2017   ETH <10 09/81/1914    Metabolic Disorder Labs: Lab Results  Component Value Date   HGBA1C 5.1 12/06/2017   MPG 99.67 12/06/2017   MPG 105 03/18/2015   Lab Results  Component Value Date   PROLACTIN 68.5 (H) 03/18/2015   Lab Results  Component Value Date   CHOL 179 12/06/2017   TRIG 122 12/06/2017   HDL 62 12/06/2017   CHOLHDL 2.9 12/06/2017   VLDL 24 12/06/2017   LDLCALC 93 12/06/2017   LDLCALC 94 03/18/2015    Physical Findings: AIMS: Facial and Oral Movements Muscles of Facial Expression: None, normal Lips and Perioral Area: None, normal Jaw: None, normal Tongue: None, normal,Extremity Movements Upper (arms, wrists, hands, fingers): None, normal Lower (legs, knees, ankles, toes): None, normal, Trunk Movements Neck, shoulders, hips: None, normal, Overall Severity Severity of abnormal movements (highest score from questions above): None, normal Incapacitation due to abnormal movements: None, normal Patient's awareness of abnormal movements (rate only patient's report): No Awareness, Dental Status Current problems with teeth and/or dentures?: (S) Yes(patient has cavities) Does patient usually wear dentures?: No  CIWA:  CIWA-Ar Total: 0 COWS:  COWS Total Score: 1  Musculoskeletal: Strength & Muscle Tone: within normal limits Gait & Station: normal Patient leans:   Psychiatric Specialty Exam: Physical Exam  Nursing note and vitals reviewed.   ROS  Blood pressure 91/69, pulse (!) 109, temperature 97.9 F (36.6 C), temperature source Oral, resp. rate 16, height '5\' 4"'  (1.626  m), weight 58.5 kg, SpO2 99 %.Body mass index is 22.14 kg/m.  General Appearance:Disheveled, restless  Eye Contact:poor  Speech:pressured  Volume:Decreased  Mood:Dysphoric  Affect:labile  Thought Process:Disorganized  Orientation:Negative  Thought Content:Negative  Suicidal Thoughts:denies  Homicidal Thoughts:No  Memory:Immediate;Fair Recent;Poor Remote;Poor  Judgement:Impaired  Insight:Lacking  Psychomotor Activity:normal  Concentration:Concentration:Poor  Recall:Poor  Fund of Knowledge:Poor  Language:Poor  Akathisia:Negative  Handed:Right  AIMS (if indicated):   Assets:Housing Social Support  ADL's:Impaired  Cognition:Impaired,Moderate  Sleep: 7 hrs       Treatment Plan Summary: Daily contact with patient to assess and evaluate symptoms and progress in treatment and Medication management Pt with psychosis, schizoaffective d/o, intellectual disability, Cerebral palsy, alcohol use issues, presenting with mood lability, s/p OD on meds, self injurious behavior.  pt anxious, labile, disorganized. Plan - Colace and MOM for constipation.  Cont Risperdal 44m hs, increase cogentin. Prn ativan for anxiety.  Cont 1:1.  IVC   Lenward Chancellor, MD 12/07/2017, 10:58 AM
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
## 2017-12-07 NOTE — Progress Notes (Signed)
Patient in room awake. Continues to experience disorganized thinking, incoherence and bizarre behaviors. Anxious and worried.  Safety monitored on 1:1.

## 2017-12-07 NOTE — [facility] Group Notes (Signed)
LCSW Group Therapy Note 12/07/2017 1:15pm  Type of Therapy and Topic: Group Therapy: Feelings Around Returning Home & Establishing a Supportive Framework and Supporting Oneself When Supports Not Available  Participation Level: Minimal  Description of Group:  Patients first processed thoughts and feelings about upcoming discharge. These included fears of upcoming changes, lack of change, new living environments, judgements and expectations from others and overall stigma of mental health issues. The group then discussed the definition of a supportive framework, what that looks and feels like, and how do to discern it from an unhealthy non-supportive network. The group identified different types of supports as well as what to do when your family/friends are less than helpful or unavailable  Therapeutic Goals  1. Patient will identify one healthy supportive network that they can use at discharge. 2. Patient will identify one factor of a supportive framework and how to tell it from an unhealthy network. 3. Patient able to identify one coping skill to use when they do not have positive supports from others. 4. Patient will demonstrate ability to communicate their needs through discussion and/or role plays.  Summary of Patient Progress:  The patient reported she feels "hopeful but  Discourage." The patient was unable to follow topic discussion.    Therapeutic Modalities Cognitive Behavioral Therapy Motivational Interviewing   Sylvia [name]  CUEBAS-COLON, LCSW 12/07/2017 11:59 AM

## 2017-12-07 NOTE — Progress Notes (Signed)
[facility] MD Progress Note  12/08/2017 2:20 PM Sylvia [name]  MRN:  [id number]  Subjective:    Ms.Mancera met with treatment team today. She reports feeling better but has been rather upset today as her cast became wet in the shower and is now "to tight". Her hand is not swollen and she is able to move her fingers freely. She is left handed and her cast is on the left hand which makes it very difficult for her to self sufficient. She has a Actuary as she has been trying to remove her cast. She likely has OCD and has been washing her hands all the time. She overdosed on medications before coming to the hospital but denies feeling depressed. Actually, in spite of cerebral palsy, she seems to be well adjusted and highly functioning. She is very realistic about her situation. She works well with her aunt who is her guardian.  Principal Problem: Schizoaffective disorder, bipolar type ([place]) Diagnosis:   Patient Active Problem List   Diagnosis Date Noted  . Schizoaffective disorder, bipolar type ([place]) [F25.0] 12/05/2017    Priority: High  . Tobacco use disorder [F17.200] 12/07/2017  . SIRS (systemic inflammatory response syndrome) ([place]) [R65.10] 12/02/2017  . Overdose [T50.901A] 12/02/2017  . Radius fracture [S52.90XA] 11/22/2017  . Substance induced mood disorder ([place]) [F19.94] 07/24/2016  . 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 Cocaine use disorder, moderate, dependence ([place]) [F14.20] 03/17/2015  . Cannabis use disorder, mild, abuse [F12.10] 03/17/2015  . Alcohol use disorder, severe, dependence ([place]) [F10.20] 03/17/2015  . Seizure disorder ([place]) [Y10.175] 09/17/2013  . Cellulitis and abscess of toe of right foot [L03.031, L02.611] 09/16/2013  . Hepatitis C [B19.20]   . CP (cerebral palsy) ([employer]) [G80.9]    Total Time spent with patient: 20 minutes  Past Psychiatric History: bipolar  Past Medical History:  Past Medical History:  Diagnosis Date  . Alcoholism ([place])   . CP  (cerebral palsy) ([place])   . Hepatitis C   . Herpes   . Polysubstance abuse ([place])   . Schizoaffective schizophrenia ([place]) 09/17/2013   [facility] as on 2014   . Seizures ([place])   . Stroke [facility])     Past Surgical History:  Procedure Laterality Date  . 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 FOOT SURGERY     rt and left  . LEG SURGERY Right    x7  . OPEN REDUCTION INTERNAL FIXATION (ORIF) DISTAL RADIAL FRACTURE Left 11/22/2017   Procedure: OPEN REDUCTION INTERNAL FIXATION (ORIF) DISTAL RADIAL FRACTURE and carpal tunnel release;  Surgeon: Thornton Park, MD;  Location: [facility] ORS;  Service: Orthopedics;  Laterality: Left;  . ORIF ULNAR FRACTURE Left 12/04/2017   Procedure: OPEN REDUCTION INTERNAL FIXATION (ORIF) ULNAR FRACTURE;  Surgeon: Thornton Park, MD;  Location: [facility] ORS;  Service: Orthopedics;  Laterality: Left;   Family History:  Family History  Problem Relation Age of Onset  . Bipolar disorder Mother   . Cancer Maternal Grandmother   . Diabetes Maternal Grandfather   . Hypertension Maternal Grandfather    Family Psychiatric  History: none Social History:  Social History   Substance and Sexual Activity  Alcohol Use Yes  . Alcohol/week: 3.0 standard drinks  . Types: 3 Cans of beer per week   Comment: Per patient 0.5 gallon to gallon when she can but since being in  group home she can't do it as often      Social History   Substance and Sexual Activity  Drug Use Yes  . Types: "Crack" cocaine, Marijuana, Cocaine   Comment: Per patient uses herion and crack and cocaine "once in a blue moon"    Social History   Socioeconomic History  . Marital status: Single    Spouse name: Not on file  . Number of children: Not on file  . Years of education: Not on file  . Highest education level: Not on file  Occupational History  .  Not on file  Social Needs  . Financial resource strain: Not on file  . Food insecurity:    Worry: Not on file    Inability: Not on file  . Transportation needs:    Medical: Not on file    Non-medical: Not on file  Tobacco Use  . Smoking status: Current Every Day Smoker    Packs/day: 1.00    Years: 20.00    Pack years: 20.00    Types: Cigarettes  . Smokeless tobacco: Never Used  Substance and Sexual Activity  . Alcohol use: Yes    Alcohol/week: 3.0 standard drinks    Types: 3 Cans of beer per week    Comment: Per patient 0.5 gallon to gallon when she can but since being in group home she can't do it as often   . Drug use: Yes    Types: "Crack" cocaine, Marijuana, Cocaine    Comment: Per patient uses herion and crack and cocaine "once in a blue moon"  . Sexual activity: Not Currently    Birth control/protection: Implant  Lifestyle  . Physical activity:    Days per week: Not on file    Minutes per session: Not on file  . Stress: Not on file  Relationships  . Social connections:    Talks on phone: Not on file    Gets together: Not on file    Attends religious service: Not on file    Active member of club or organization: Not on file    Attends meetings of clubs or organizations: Not on file    Relationship status: Not on file  Other Topics Concern  . Not on file  Social History Narrative   Lives at home by herself, only has acquaintances here   Does not work    Additional Social History:                         Sleep: Fair  Appetite:  Fair  Current Medications: Current Facility-Administered Medications  Medication Dose Route Frequency Provider Last Rate Last Dose  . acetaminophen (TYLENOL) tablet 650 mg  650 mg Oral Q6H PRN Clapacs, Madie Reno, MD   650 mg at 12/08/17 1057  . alum & mag hydroxide-simeth (MAALOX/MYLANTA) 200-200-20 MG/5ML suspension 30 mL  30 mL Oral Q4H PRN Clapacs, John T, MD      . benztropine (COGENTIN) tablet 0.5 mg  0.5 mg Oral BID  Lenward Chancellor, MD   0.5 mg at 12/08/17 1102  . cholecalciferol (VITAMIN D) tablet 1,000 Units  1,000 Units Oral Daily Clapacs, Madie Reno, MD   1,000 Units at 12/08/17 1102  . docusate sodium (COLACE) capsule 100 mg  100 mg Oral BID Clapacs, John T, MD   100 mg at 12/08/17 1102  . folic acid (FOLVITE) tablet 1 mg  1 mg Oral Daily Clapacs, Madie Reno, MD   1 mg  at 12/08/17 1102  . gabapentin (NEURONTIN) capsule 300 mg  300 mg Oral BID Clapacs, Madie Reno, MD   300 mg at 12/08/17 1103  . hydrOXYzine (ATARAX/VISTARIL) tablet 50 mg  50 mg Oral TID Clapacs, Madie Reno, MD   50 mg at 12/08/17 1103  . levETIRAcetam (KEPPRA) tablet 250 mg  250 mg Oral BID Clapacs, Madie Reno, MD   250 mg at 12/08/17 1103  . LORazepam (ATIVAN) tablet 1 mg  1 mg Oral TID PRN Lenward Chancellor, MD      . magnesium hydroxide (MILK OF MAGNESIA) suspension 30 mL  30 mL Oral Daily PRN Clapacs, John T, MD   30 mL at 12/07/17 1013  . methocarbamol (ROBAXIN) tablet 500 mg  500 mg Oral Q6H PRN Clapacs, Madie Reno, MD   500 mg at 12/05/17 2305  . multivitamin with minerals tablet 1 tablet  1 tablet Oral Daily Clapacs, Madie Reno, MD   1 tablet at 12/08/17 1101  . nicotine (NICODERM CQ - dosed in mg/24 hours) patch 21 mg  21 mg Transdermal Daily Clapacs, John T, MD   21 mg at 12/08/17 1155  . ondansetron (ZOFRAN) tablet 4 mg  4 mg Oral Q6H PRN Clapacs, John T, MD      . risperiDONE (RISPERDAL M-TABS) disintegrating tablet 4 mg  4 mg Oral QHS Clapacs, Madie Reno, MD   4 mg at 12/07/17 2203  . thiamine (VITAMIN B-1) tablet 100 mg  100 mg Oral Daily Clapacs, John T, MD   100 mg at 12/08/17 1103   Or  . thiamine (B-1) injection 100 mg  100 mg Intravenous Daily Clapacs, Madie Reno, MD      . traZODone (DESYREL) tablet 50 mg  50 mg Oral QHS Lenward Chancellor, MD   50 mg at 12/07/17 2204    Lab Results:  Results for orders placed or performed during the hospital encounter of 12/05/17 (from the past 48 hour(s))  Basic metabolic panel     Status: Abnormal   Collection  Time: 12/07/17  6:34 AM  Result Value Ref Range   Sodium 139 135 - 145 mmol/L   Potassium 4.4 3.5 - 5.1 mmol/L   Chloride 103 98 - 111 mmol/L   CO2 28 22 - 32 mmol/L   Glucose, Bld 107 (H) 70 - 99 mg/dL   BUN 8 6 - 20 mg/dL   Creatinine, Ser 0.78 0.44 - 1.00 mg/dL   Calcium 9.3 8.9 - 10.3 mg/dL   GFR calc non Af Amer >60 >60 mL/min   GFR calc Af Amer >60 >60 mL/min    Comment: (NOTE) The eGFR has been calculated using the CKD EPI equation. This calculation has not been validated in all clinical situations. eGFR's persistently <60 mL/min signify possible Chronic Kidney Disease.    Anion gap 8 5 - 15    Comment: Performed at [facility], [address] [place], [place] [address]  CBC     Status: Abnormal   Collection Time: 12/07/17  6:34 AM  Result Value Ref Range   WBC 5.3 4.0 - 10.5 K/uL   RBC 3.47 (L) 3.87 - 5.11 MIL/uL   Hemoglobin 11.5 (L) 12.0 - 15.0 g/dL   HCT 35.0 (L) 36.0 - 46.0 %   MCV 100.9 (H) 80.0 - 100.0 fL   MCH 33.1 26.0 - 34.0 pg   MCHC 32.9 30.0 - 36.0 g/dL   RDW 13.2 11.5 - 15.5 %   Platelets 250 150 - 400 K/uL  nRBC 0.0 0.0 - 0.2 %    Comment: Performed at [facility], [address] [place], [place] [address]    Blood Alcohol level:  Lab Results  Component Value Date   ETH 67 (H) 12/02/2017   ETH <10 38/75/6433    Metabolic Disorder Labs: Lab Results  Component Value Date   HGBA1C 5.1 12/06/2017   MPG 99.67 12/06/2017   MPG 105 03/18/2015   Lab Results  Component Value Date   PROLACTIN 68.5 (H) 03/18/2015   Lab Results  Component Value Date   CHOL 179 12/06/2017   TRIG 122 12/06/2017   HDL 62 12/06/2017   CHOLHDL 2.9 12/06/2017   VLDL 24 12/06/2017   LDLCALC 93 12/06/2017   LDLCALC 94 03/18/2015    Physical Findings: AIMS: Facial and Oral Movements Muscles of Facial Expression: None, normal Lips and Perioral Area: None, normal Jaw: None, normal Tongue: None, normal,Extremity Movements Upper (arms,  wrists, hands, fingers): None, normal Lower (legs, knees, ankles, toes): None, normal, Trunk Movements Neck, shoulders, hips: None, normal, Overall Severity Severity of abnormal movements (highest score from questions above): None, normal Incapacitation due to abnormal movements: None, normal Patient's awareness of abnormal movements (rate only patient's report): No Awareness, Dental Status Current problems with teeth and/or dentures?: (S) Yes(patient has cavities) Does patient usually wear dentures?: No  CIWA:  CIWA-Ar Total: 0 COWS:  COWS Total Score: 1  Musculoskeletal: Strength & Muscle Tone: within normal limits Gait & Station: normal Patient leans: N/A  Psychiatric Specialty Exam: Physical Exam  Nursing note and vitals reviewed. Psychiatric: Her speech is normal. Thought content normal. Her mood appears anxious. She is hyperactive. Cognition and memory are normal. She expresses impulsivity.    Review of Systems  Neurological: Negative.   Psychiatric/Behavioral: Positive for substance abuse.  All other systems reviewed and are negative.   Blood pressure 97/65, pulse (!) 115, temperature 98.6 F (37 C), temperature source Oral, resp. rate 18, height '5\' 4"'  (1.626 m), weight 58.5 kg, SpO2 100 %.Body mass index is 22.14 kg/m.  General Appearance: Casual  Eye Contact:  Good  Speech:  Clear and Coherent  Volume:  Normal  Mood:  Euthymic  Affect:  Appropriate  Thought Process:  Goal Directed and Descriptions of Associations: Intact  Orientation:  Full (Time, Place, and Person)  Thought Content:  WDL  Suicidal Thoughts:  No  Homicidal Thoughts:  No  Memory:  Immediate;   Fair Recent;   Fair Remote;   Fair  Judgement:  Impaired  Insight:  Shallow  Psychomotor Activity:  Normal  Concentration:  Concentration: Fair and Attention Span: Fair  Recall:  [employer] of Knowledge:  Fair  Language:  Fair  Akathisia:  No  Handed:  Right  AIMS (if indicated):     Assets:   Communication Skills Desire for Improvement Financial Resources/Insurance Housing Physical Health Resilience Social Support  ADL's:  Intact  Cognition:  WNL  Sleep:  Number of Hours: 6     Treatment Plan Summary: Daily contact with patient to assess and evaluate symptoms and progress in treatment and Medication management   Ms. Markus is a 32 year old female with a history of schizoaffective disorder, cerebral palsy, borderline intellectual functioning and substance abuse admitted after overdose.  #Suicidal ideation -patient able to contract for safety in the hospital  #Mood -continue Risperdal M tab 4 mg nightly -Cogentin 0.5 mg BID  #Forearm fracture, in cast -1:1 sitter  #Alcohol abuse -VS are stable, no signs of  withdrawal  #Seizures -continue Keppra 250 mg BID -Neuronyin 300 mg BID  #Smoking cessation -nicotine patch is available  #Disposition -discharge with family -follow up with ACT team    Orson Slick, MD 12/08/2017, 2:20 PM

## 2017-12-07 NOTE — Progress Notes (Signed)
Safety Note:  0800: Patient in the dayroom eating her breakfast with safety sitter. 0900: Patient in her room sitting on the side of her bed. Safety sitter present. 1000: Patient seen ambulating unit with safety sitter to dayroom. No distress noted. 1100: Patient in the dayroom interacting with select peers. Sitter at her side. 1200: Patient in the dayroom eating her lunch. Safety sitter in place, no distress noted at this time. 1300: Patient in dayroom no interaction noted. Sitter at side. 1400: Patient ambulating on the yellow hall with sitter at her side. 1500:  Patient in her room with sitter, sitter notified this writer that patient removed her cast again. Not only did patient removed her cast, she wet the inside of it. 1600: Patient up in the milieu interacting with her peers. Sitting at the table working on a puzzle. Safety sitter at her side.  1700: Patient in the dayroom minimally working on a puzzle, Recruitment consultant within arms reach. 1800: Patient in dayroom watching TV, interacting with her peers. Safety sitter in place. 1900: Patient in the dayroom working on her puzzle. Safety sitter in place.

## 2017-12-07 NOTE — Progress Notes (Signed)
Patient went to the dayroom, had a snack and returned to her room. Staff continue to monitor on 1:1.

## 2017-12-07 NOTE — Progress Notes (Signed)
Patient went to her room and took a bath assisted by the sitter. Currently in the dayroom, eating a snack. Safety monitored on 1:1.

## 2017-12-07 NOTE — Progress Notes (Signed)
Patient currently in room awake, sitter at bedside. Upon medication intake, patient  Commented "I am not interested in those..wish it was alcohol" making a funny face but took all the scheduled medications. Patient continues to use incoherent sentences with flight of ideas but alert and oriented. Able to express her concerns as needed. Staff continue to provide support and encouragements. Safety maintained on 1:1.

## 2017-12-07 NOTE — Plan of Care (Signed)
Thought process improved but patient continues to experience incoherency and delusions at times. Compliant with medications.

## 2017-12-07 NOTE — Progress Notes (Signed)
Patient remains in room, talking to sitter. Continues to experience disorganized thinking and behaviors. Encouragements provided. Safety maintained on 1:1.

## 2017-12-07 NOTE — Progress Notes (Signed)
Remains in bed asleep. No sign of discomfort. Safety sitter at bedside.

## 2017-12-07 NOTE — Progress Notes (Signed)
Remains in bed asleep. No sign of distress. Safety monitored on 1:1.

## 2017-12-07 NOTE — Progress Notes (Signed)
Currently in bed and has fallen asleep: eyes closed, respirations within normal limits. No sign of distress. Sitter remains at bedside for safety.

## 2017-12-08 NOTE — Progress Notes (Signed)
   12/08/17 0900  15 Minute Checks  Reason for Precaution Safety  Location Bed- Sleeping  Environmental Check No potentially dangerous items visible

## 2017-12-08 NOTE — Progress Notes (Signed)
   12/08/17 1015  15 Minute Checks  Reason for Precaution Safety  Location Bed- Sleeping  Environmental Check No potentially dangerous items visible

## 2017-12-08 NOTE — Progress Notes (Signed)
   12/08/17 0800  15 Minute Checks  Reason for Precaution Safety  Location Bed- Sleeping  Environmental Check No potentially dangerous items visible

## 2017-12-08 NOTE — [facility] Group Notes (Signed)
2                    [facility] Group Notes:  (Nursing/MHT/Case Management/Adjunct)  Date:  12/08/2017  Time:  11:21 PM  Type of Therapy:  Group Therapy  Participation Level:  Active  Participation Quality:  Appropriate  Affect:  Appropriate  Cognitive:  Appropriate  Insight:  Appropriate  Engagement in Group:  Engaged  Modes of Intervention:  Discussion  Summary of Progress/Problems:  Sylvia [name] 12/08/2017, 11:21 PM

## 2017-12-08 NOTE — Progress Notes (Signed)
Patient in bed sleeping. No sign of distress. Safety monitored on 1:1

## 2017-12-08 NOTE — Progress Notes (Signed)
   12/08/17 1300  15 Minute Checks  Reason for Precaution Safety  Location Room  Environmental Check No potentially dangerous items visible

## 2017-12-08 NOTE — Progress Notes (Signed)
Patient in bed sleeping. Remains on 1:1 for safety

## 2017-12-08 NOTE — Progress Notes (Signed)
   12/08/17 1100  15 Minute Checks  Reason for Precaution Safety  Location Bathroom  Environmental Check No potentially dangerous items visible

## 2017-12-08 NOTE — Progress Notes (Signed)
   12/08/17 1600  15 Minute Checks  Reason for Precaution Safety  Location Room  Environmental Check No potentially dangerous items visible

## 2017-12-08 NOTE — Progress Notes (Signed)
Recreation Therapy Notes  Date: 12/08/2017  Time: 9:30 am   Location: Craft room   Behavioral response: N/A   Intervention Topic: Stress  Discussion/Intervention: Patient did not attend group.   Clinical Observations/Feedback:  Patient did not attend group.   Oluwatamilore Starnes LRT/CTRS        Caleen Taaffe 12/08/2017 12:22 PM 

## 2017-12-08 NOTE — Progress Notes (Signed)
Patient remains asleep. Safety maintained on 1:1 

## 2017-12-08 NOTE — Progress Notes (Signed)
Remains asleep. Safety maintained on 1:1.  

## 2017-12-08 NOTE — Progress Notes (Addendum)
[facility] MD Progress Note  12/09/2017 2:30 PM Sylvia [name]  MRN:  [id number]  Subjective:   Sylvia [name] feels better today but is still pressured with racing thoughts. Refused Risperdal last night. She does not like the taste, too many pills and gives her bad nightmares. She has a sling now and her arm feels much better. Still has a Actuary.  She likely has PTSD and OCD but wants no treatment "no more pills". She reports that she spoke with [employer] care coordinator and could move into her new apartment any time now. Needs furniture that will be provided by [place] with the guardian, [phone]. She approves of medication adjustment to address bipolar, OCD, PTSD including injectable antipsychotics. Requests a different ACT team.  Principal Problem: Schizoaffective disorder, bipolar type ([place]) Diagnosis:   Patient Active Problem List   Diagnosis Date Noted  . Schizoaffective disorder, bipolar type ([place]) [F25.0] 12/05/2017    Priority: High  . Tobacco use disorder [F17.200] 12/07/2017  . SIRS (systemic inflammatory response syndrome) ([place]) [R65.10] 12/02/2017  . Overdose [T50.901A] 12/02/2017  . Radius fracture [S52.90XA] 11/22/2017  . Substance induced mood disorder ([place]) [F19.94] 07/24/2016  . 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 Cocaine use disorder, moderate, dependence ([place]) [F14.20] 03/17/2015  . Cannabis use disorder, mild, abuse [F12.10] 03/17/2015  . Alcohol use disorder, severe, dependence ([place]) [F10.20] 03/17/2015  . Seizure disorder ([place]) [K27.062] 09/17/2013  . Cellulitis and abscess of toe of right foot [L03.031, L02.611] 09/16/2013  . Hepatitis C [B19.20]   . CP (cerebral palsy) ([place]) [G80.9]    Total Time spent with patient: 20 minutes  Past Psychiatric History: depression  Past Medical History:  Past Medical History:  Diagnosis Date  . Alcoholism ([place])   . CP (cerebral palsy) ([place])   . Hepatitis C   . Herpes   .  Polysubstance abuse ([place])   . Schizoaffective schizophrenia ([place]) 09/17/2013   Per notes from [facility] as on 2014   . Seizures ([place])   . Stroke [facility])     Past Surgical History:  Procedure Laterality Date  . 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 FOOT SURGERY     rt and left  . LEG SURGERY Right    x7  . OPEN REDUCTION INTERNAL FIXATION (ORIF) DISTAL RADIAL FRACTURE Left 11/22/2017   Procedure: OPEN REDUCTION INTERNAL FIXATION (ORIF) DISTAL RADIAL FRACTURE and carpal tunnel release;  Surgeon: Thornton Park, MD;  Location: [facility] ORS;  Service: Orthopedics;  Laterality: Left;  . ORIF ULNAR FRACTURE Left 12/04/2017   Procedure: OPEN REDUCTION INTERNAL FIXATION (ORIF) ULNAR FRACTURE;  Surgeon: Thornton Park, MD;  Location: [facility] ORS;  Service: Orthopedics;  Laterality: Left;   Family History:  Family History  Problem Relation Age of Onset  . Bipolar disorder Mother   . Cancer Maternal Grandmother   . Diabetes Maternal Grandfather   . Hypertension Maternal Grandfather    Family Psychiatric  History: unknown Social History:  Social History   Substance and Sexual Activity  Alcohol Use Yes  . Alcohol/week: 3.0 standard drinks  . Types: 3 Cans of beer per week   Comment: Per patient 0.5 gallon to gallon when she can but since being in group home she can't do it as often      Social History   Substance and Sexual Activity  Drug Use Yes  .  Types: "Crack" cocaine, Marijuana, Cocaine   Comment: Per patient uses herion and crack and cocaine "once in a blue moon"    Social History   Socioeconomic History  . Marital status: Single    Spouse name: Not on file  . Number of children: Not on file  . Years of education: Not on file  . Highest education level: Not on file  Occupational History  . Not on file  Social Needs  . Financial resource  strain: Not on file  . Food insecurity:    Worry: Not on file    Inability: Not on file  . Transportation needs:    Medical: Not on file    Non-medical: Not on file  Tobacco Use  . Smoking status: Current Every Day Smoker    Packs/day: 1.00    Years: 20.00    Pack years: 20.00    Types: Cigarettes  . Smokeless tobacco: Never Used  Substance and Sexual Activity  . Alcohol use: Yes    Alcohol/week: 3.0 standard drinks    Types: 3 Cans of beer per week    Comment: Per patient 0.5 gallon to gallon when she can but since being in group home she can't do it as often   . Drug use: Yes    Types: "Crack" cocaine, Marijuana, Cocaine    Comment: Per patient uses herion and crack and cocaine "once in a blue moon"  . Sexual activity: Not Currently    Birth control/protection: Implant  Lifestyle  . Physical activity:    Days per week: Not on file    Minutes per session: Not on file  . Stress: Not on file  Relationships  . Social connections:    Talks on phone: Not on file    Gets together: Not on file    Attends religious service: Not on file    Active member of club or organization: Not on file    Attends meetings of clubs or organizations: Not on file    Relationship status: Not on file  Other Topics Concern  . Not on file  Social History Narrative   Lives at home by herself, only has acquaintances here   Does not work    Additional Social History:                         Sleep: Fair  Appetite:  Fair  Current Medications: Current Facility-Administered Medications  Medication Dose Route Frequency Provider Last Rate Last Dose  . acetaminophen (TYLENOL) tablet 650 mg  650 mg Oral Q6H PRN Clapacs, Madie Reno, MD   650 mg at 12/08/17 1831  . alum & mag hydroxide-simeth (MAALOX/MYLANTA) 200-200-20 MG/5ML suspension 30 mL  30 mL Oral Q4H PRN Clapacs, John T, MD      . benztropine (COGENTIN) tablet 0.5 mg  0.5 mg Oral BID Lenward Chancellor, MD   0.5 mg at 12/09/17 0900  .  cholecalciferol (VITAMIN D) tablet 1,000 Units  1,000 Units Oral Daily Clapacs, Madie Reno, MD   1,000 Units at 12/09/17 0901  . docusate sodium (COLACE) capsule 100 mg  100 mg Oral BID Clapacs, John T, MD   100 mg at 12/08/17 1102  . folic acid (FOLVITE) tablet 1 mg  1 mg Oral Daily Clapacs, Madie Reno, MD   1 mg at 12/09/17 0901  . gabapentin (NEURONTIN) capsule 600 mg  600 mg Oral BID Keyontay Stolz B, MD      . hydrOXYzine (ATARAX/VISTARIL)  tablet 50 mg  50 mg Oral TID Clapacs, Madie Reno, MD   50 mg at 12/09/17 1253  . levETIRAcetam (KEPPRA) tablet 250 mg  250 mg Oral BID Clapacs, Madie Reno, MD   250 mg at 12/09/17 0857  . LORazepam (ATIVAN) tablet 1 mg  1 mg Oral TID PRN Lenward Chancellor, MD      . magnesium hydroxide (MILK OF MAGNESIA) suspension 30 mL  30 mL Oral Daily PRN Clapacs, John T, MD   30 mL at 12/07/17 1013  . methocarbamol (ROBAXIN) tablet 500 mg  500 mg Oral Q6H PRN Clapacs, Madie Reno, MD   500 mg at 12/09/17 0856  . multivitamin with minerals tablet 1 tablet  1 tablet Oral Daily Clapacs, Madie Reno, MD   1 tablet at 12/09/17 0901  . nicotine (NICODERM CQ - dosed in mg/24 hours) patch 21 mg  21 mg Transdermal Daily Clapacs, Madie Reno, MD   21 mg at 12/09/17 0907  . risperiDONE (RISPERDAL M-TABS) disintegrating tablet 4 mg  4 mg Oral QHS Clapacs, Madie Reno, MD   4 mg at 12/07/17 2203  . traZODone (DESYREL) tablet 50 mg  50 mg Oral QHS Lenward Chancellor, MD   50 mg at 12/07/17 2204    Lab Results:  Results for orders placed or performed during the hospital encounter of 12/05/17 (from the past 48 hour(s))  Creatinine, serum     Status: None   Collection Time: 12/09/17  7:06 AM  Result Value Ref Range   Creatinine, Ser 0.63 0.44 - 1.00 mg/dL   GFR calc non Af Amer >60 >60 mL/min   GFR calc Af Amer >60 >60 mL/min    Comment: (NOTE) The eGFR has been calculated using the CKD EPI equation. This calculation has not been validated in all clinical situations. eGFR's persistently <60 mL/min signify  possible Chronic Kidney Disease. Performed at [facility], [address] [place], [place] [address]     Blood Alcohol level:  Lab Results  Component Value Date   ETH 67 (H) 12/02/2017   ETH <10 62/95/2841    Metabolic Disorder Labs: Lab Results  Component Value Date   HGBA1C 5.1 12/06/2017   MPG 99.67 12/06/2017   MPG 105 03/18/2015   Lab Results  Component Value Date   PROLACTIN 68.5 (H) 03/18/2015   Lab Results  Component Value Date   CHOL 179 12/06/2017   TRIG 122 12/06/2017   HDL 62 12/06/2017   CHOLHDL 2.9 12/06/2017   VLDL 24 12/06/2017   LDLCALC 93 12/06/2017   LDLCALC 94 03/18/2015    Physical Findings: AIMS: Facial and Oral Movements Muscles of Facial Expression: None, normal Lips and Perioral Area: None, normal Jaw: None, normal Tongue: None, normal,Extremity Movements Upper (arms, wrists, hands, fingers): None, normal Lower (legs, knees, ankles, toes): None, normal, Trunk Movements Neck, shoulders, hips: None, normal, Overall Severity Severity of abnormal movements (highest score from questions above): None, normal Incapacitation due to abnormal movements: None, normal Patient's awareness of abnormal movements (rate only patient's report): No Awareness, Dental Status Current problems with teeth and/or dentures?: (S) Yes(patient has cavities) Does patient usually wear dentures?: No  CIWA:  CIWA-Ar Total: 0 COWS:  COWS Total Score: 1  Musculoskeletal: Strength & Muscle Tone: within normal limits Gait & Station: normal Patient leans: N/A  Psychiatric Specialty Exam: Physical Exam  Nursing note and vitals reviewed. Psychiatric: She has a normal mood and affect. Her speech is normal. Thought content normal. She is hyperactive. Cognition  and memory are normal. She expresses impulsivity.    Review of Systems  Musculoskeletal:       Cast on left forearm   Neurological: Negative.   Psychiatric/Behavioral: Positive for substance abuse.   All other systems reviewed and are negative.   Blood pressure 114/76, pulse 81, temperature 98.2 F (36.8 C), temperature source Oral, resp. rate 18, height '5\' 4"'  (1.626 m), weight 58.5 kg, SpO2 100 %.Body mass index is 22.14 kg/m.  General Appearance: Casual  Eye Contact:  Good  Speech:  Clear and Coherent  Volume:  Normal  Mood:  Anxious  Affect:  Appropriate  Thought Process:  Goal Directed and Descriptions of Associations: Intact  Orientation:  Full (Time, Place, and Person)  Thought Content:  WDL  Suicidal Thoughts:  No  Homicidal Thoughts:  No  Memory:  Immediate;   Fair Recent;   Fair Remote;   Fair  Judgement:  Impaired  Insight:  Lacking  Psychomotor Activity:  Increased  Concentration:  Concentration: Fair and Attention Span: Fair  Recall:  [employer] of Knowledge:  Fair  Language:  Fair  Akathisia:  No  Handed:  Right  AIMS (if indicated):     Assets:  Communication Skills Desire for Improvement Financial Resources/Insurance Resilience Social Support  ADL's:  Intact  Cognition:  WNL  Sleep:  Number of Hours: 6.5     Treatment Plan Summary: Daily contact with patient to assess and evaluate symptoms and progress in treatment and Medication management   Sylvia [name] is a 32 year old female with a history of schizoaffective disorder, cerebral palsy, borderline intellectual functioning and substance abuse admitted after overdose.  #Suicidal ideation -patient able to contract for safety in the hospital  #Mood, improved but now refuses medications -continue Risperdal 4 mg nightly  -Cogentin 0.5 mg BID -refuses Luvox 50 mg nightly for OCD  #Forearm fracture, in cast -1:1 sitter  #Alcohol abuse -VS are stable, no signs of withdrawal  #Seizures -continue Keppra 250 mg BID -increase Neurontin to 600 mg BID, her regular dose  #Smoking cessation -nicotine patch is available  #Disposition -discharge with family -follow up with ACT  team  Orson Slick, MD 12/09/2017, 2:30 PM

## 2017-12-08 NOTE — Plan of Care (Signed)
Patient up in the milieu late morning. Refused her medications at 0800 this morning but took them when she got up. Affect got brighter after she got up and started to move around. Cast remains to her L) Fa, skin warm with normal skin color. No restriction to digits. Safety sitter in place.

## 2017-12-08 NOTE — Progress Notes (Signed)
Patient attended group and remained cooperative. Had a snack and currently back in room. Safety monitored on 1:1.

## 2017-12-08 NOTE — Progress Notes (Signed)
Patient came to the medication room and discussed her bedtime medications.  Refused her Risperidone saying "I already told the doctor that I am not going to take it". Patient left the medication room, upset then went to bed.

## 2017-12-08 NOTE — Progress Notes (Signed)
Patient in bed asleep. No sign of discomfort. Safety maintained on 1:1.

## 2017-12-08 NOTE — Progress Notes (Signed)
Patient woke up complaining that "I am about to  lose my arm". Irritable with disorganized thinking, talking to herself. Did not want any medications. Staff continue to monitor, sitter at bedside.

## 2017-12-08 NOTE — Progress Notes (Signed)
   12/08/17 1830  15 Minute Checks  Reason for Precaution Safety  Location Medication Room  Environmental Check No potentially dangerous items visible

## 2017-12-08 NOTE — Progress Notes (Signed)
Patient in room with sitter. Alert and oriented. Anxious with disorganized thinking. Pleasant on approach but continues to complain about her casted arm "I am tired of this thing, please take it away". No other concerns. Staff continue to provide support and encouragements.

## 2017-12-08 NOTE — Progress Notes (Signed)
   12/08/17 1400  15 Minute Checks  Reason for Precaution Safety  Location Room  Environmental Check No potentially dangerous items visible

## 2017-12-08 NOTE — Progress Notes (Signed)
   12/08/17 1700  15 Minute Checks  Reason for Precaution Safety  Location Bathroom (brushing teeth)  Environmental Check No potentially dangerous items visible

## 2017-12-08 NOTE — Tx Team (Addendum)
Interdisciplinary Treatment and Diagnostic Plan Update  12/08/2017 Time of Session: 10:30am Sylvia [name] MRN: [id number]  Principal Diagnosis: Schizoaffective disorder, bipolar type (HCC)  Secondary Diagnoses: Principal Problem:   Schizoaffective disorder, bipolar type (HCC) Active Problems:   CP (cerebral palsy) (HCC)   Seizure disorder (HCC)   Borderline intellectual functioning   Radius fracture   Overdose   Tobacco use disorder   Current Medications:  Current Facility-Administered Medications  Medication Dose Route Frequency Provider Last Rate Last Dose  . acetaminophen (TYLENOL) tablet 650 mg  650 mg Oral Q6H PRN Clapacs, Jackquline Denmark, MD   650 mg at 12/08/17 1057  . alum & mag hydroxide-simeth (MAALOX/MYLANTA) 200-200-20 MG/5ML suspension 30 mL  30 mL Oral Q4H PRN Clapacs, John T, MD      . benztropine (COGENTIN) tablet 0.5 mg  0.5 mg Oral BID Beverly Sessions, MD   0.5 mg at 12/08/17 1102  . cholecalciferol (VITAMIN D) tablet 1,000 Units  1,000 Units Oral Daily Clapacs, Jackquline Denmark, MD   1,000 Units at 12/08/17 1102  . docusate sodium (COLACE) capsule 100 mg  100 mg Oral BID Clapacs, John T, MD   100 mg at 12/08/17 1102  . folic acid (FOLVITE) tablet 1 mg  1 mg Oral Daily Clapacs, John T, MD   1 mg at 12/08/17 1102  . gabapentin (NEURONTIN) capsule 300 mg  300 mg Oral BID Clapacs, Jackquline Denmark, MD   300 mg at 12/08/17 1103  . hydrOXYzine (ATARAX/VISTARIL) tablet 50 mg  50 mg Oral TID Clapacs, Jackquline Denmark, MD   50 mg at 12/08/17 1103  . levETIRAcetam (KEPPRA) tablet 250 mg  250 mg Oral BID Clapacs, Jackquline Denmark, MD   250 mg at 12/08/17 1103  . LORazepam (ATIVAN) tablet 1 mg  1 mg Oral TID PRN Beverly Sessions, MD      . magnesium hydroxide (MILK OF MAGNESIA) suspension 30 mL  30 mL Oral Daily PRN Clapacs, John T, MD   30 mL at 12/07/17 1013  . methocarbamol (ROBAXIN) tablet 500 mg  500 mg Oral Q6H PRN Clapacs, Jackquline Denmark, MD   500 mg at 12/05/17 2305  . multivitamin with minerals tablet 1 tablet  1 tablet  Oral Daily Clapacs, Jackquline Denmark, MD   1 tablet at 12/08/17 1101  . nicotine (NICODERM CQ - dosed in mg/24 hours) patch 21 mg  21 mg Transdermal Daily Clapacs, Jackquline Denmark, MD   21 mg at 12/07/17 0815  . ondansetron (ZOFRAN) tablet 4 mg  4 mg Oral Q6H PRN Clapacs, John T, MD      . risperiDONE (RISPERDAL M-TABS) disintegrating tablet 4 mg  4 mg Oral QHS Clapacs, Jackquline Denmark, MD   4 mg at 12/07/17 2203  . thiamine (VITAMIN B-1) tablet 100 mg  100 mg Oral Daily Clapacs, John T, MD   100 mg at 12/08/17 1103   Or  . thiamine (B-1) injection 100 mg  100 mg Intravenous Daily Clapacs, John T, MD      . traZODone (DESYREL) tablet 50 mg  50 mg Oral QHS Beverly Sessions, MD   50 mg at 12/07/17 2204   PTA Medications: Medications Prior to Admission  Medication Sig Dispense Refill Last Dose  . ARIPiprazole (ABILIFY) 5 MG tablet Take 5 mg by mouth daily.   unknown at unknown  . benztropine (COGENTIN) 0.5 MG tablet Take 0.5 mg by mouth at bedtime.   Not Taking at Unknown time  . cholecalciferol (VITAMIN D) 1000 units tablet  Take 1,000 Units by mouth daily.   unknown at unknown  . citalopram (CELEXA) 20 MG tablet Take 20 mg by mouth daily.   Not Taking at Unknown time  . docusate sodium (COLACE) 100 MG capsule Take 1 capsule (100 mg total) by mouth 2 (two) times daily. 10 capsule 0   . FLUoxetine (PROZAC) 20 MG capsule Take 20 mg by mouth daily.   unknown at unknown  . gabapentin (NEURONTIN) 300 MG capsule Take 300 mg by mouth 2 (two) times daily.    at unknown  . hydrOXYzine (ATARAX/VISTARIL) 50 MG tablet Take 50 mg by mouth 3 (three) times daily.   unknown at unknown  . levETIRAcetam (KEPPRA) 250 MG tablet Take 250 mg by mouth at bedtime.   unknown at unknown  . nicotine (NICODERM CQ - DOSED IN MG/24 HOURS) 21 mg/24hr patch Place 1 patch (21 mg total) onto the skin daily. 28 patch 0   . oxyCODONE (OXY IR/ROXICODONE) 5 MG immediate release tablet Take 1 tablet (5 mg total) by mouth every 4 (four) hours as needed for  moderate pain or severe pain. 30 tablet 0   . risperiDONE (RISPERDAL) 3 MG tablet Take 3 mg by mouth 2 (two) times daily.   unknown at unknown    Patient Stressors: Financial difficulties Medication change or noncompliance Substance abuse  Patient Strengths: Motivation for treatment/growth Supportive family/friends  Treatment Modalities: Medication Management, Group therapy, Case management,  1 to 1 session with clinician, Psychoeducation, Recreational therapy.   Physician Treatment Plan for Primary Diagnosis: Schizoaffective disorder, bipolar type (HCC) Long Term Goal(s): Improvement in symptoms so as ready for discharge Improvement in symptoms so as ready for discharge   Short Term Goals: Ability to identify changes in lifestyle to reduce recurrence of condition will improve Ability to verbalize feelings will improve Ability to disclose and discuss suicidal ideas Ability to demonstrate self-control will improve Ability to identify and develop effective coping behaviors will improve Ability to maintain clinical measurements within normal limits will improve Compliance with prescribed medications will improve Ability to identify triggers associated with substance abuse/mental health issues will improve Ability to identify changes in lifestyle to reduce recurrence of condition will improve Ability to verbalize feelings will improve Ability to disclose and discuss suicidal ideas Ability to demonstrate self-control will improve Ability to identify and develop effective coping behaviors will improve Ability to maintain clinical measurements within normal limits will improve Compliance with prescribed medications will improve Ability to identify triggers associated with substance abuse/mental health issues will improve  Medication Management: Evaluate patient's response, side effects, and tolerance of medication regimen.  Therapeutic Interventions: 1 to 1 sessions, Unit Group  sessions and Medication administration.  Evaluation of Outcomes: Progressing  Physician Treatment Plan for Secondary Diagnosis: Principal Problem:   Schizoaffective disorder, bipolar type (HCC) Active Problems:   CP (cerebral palsy) (HCC)   Seizure disorder (HCC)   Borderline intellectual functioning   Radius fracture   Overdose   Tobacco use disorder  Long Term Goal(s): Improvement in symptoms so as ready for discharge Improvement in symptoms so as ready for discharge   Short Term Goals: Ability to identify changes in lifestyle to reduce recurrence of condition will improve Ability to verbalize feelings will improve Ability to disclose and discuss suicidal ideas Ability to demonstrate self-control will improve Ability to identify and develop effective coping behaviors will improve Ability to maintain clinical measurements within normal limits will improve Compliance with prescribed medications will improve Ability to identify triggers associated  with substance abuse/mental health issues will improve Ability to identify changes in lifestyle to reduce recurrence of condition will improve Ability to verbalize feelings will improve Ability to disclose and discuss suicidal ideas Ability to demonstrate self-control will improve Ability to identify and develop effective coping behaviors will improve Ability to maintain clinical measurements within normal limits will improve Compliance with prescribed medications will improve Ability to identify triggers associated with substance abuse/mental health issues will improve     Medication Management: Evaluate patient's response, side effects, and tolerance of medication regimen.  Therapeutic Interventions: 1 to 1 sessions, Unit Group sessions and Medication administration.  Evaluation of Outcomes: Progressing   RN Treatment Plan for Primary Diagnosis: Schizoaffective disorder, bipolar type (HCC) Long Term Goal(s): Knowledge of disease  and therapeutic regimen to maintain health will improve  Short Term Goals: Ability to participate in decision making will improve, Ability to verbalize feelings will improve, Ability to disclose and discuss suicidal ideas, Ability to identify and develop effective coping behaviors will improve and Compliance with prescribed medications will improve  Medication Management: RN will administer medications as ordered by provider, will assess and evaluate patient's response and provide education to patient for prescribed medication. RN will report any adverse and/or side effects to prescribing provider.  Therapeutic Interventions: 1 on 1 counseling sessions, Psychoeducation, Medication administration, Evaluate responses to treatment, Monitor vital signs and CBGs as ordered, Perform/monitor CIWA, COWS, AIMS and Fall Risk screenings as ordered, Perform wound care treatments as ordered.  Evaluation of Outcomes: Progressing   LCSW Treatment Plan for Primary Diagnosis: Schizoaffective disorder, bipolar type (HCC) Long Term Goal(s): Safe transition to appropriate next level of care at discharge, Engage patient in therapeutic group addressing interpersonal concerns.  Short Term Goals: Engage patient in aftercare planning with referrals and resources, Increase ability to appropriately verbalize feelings, Increase emotional regulation, Identify triggers associated with mental health/substance abuse issues and Increase skills for wellness and recovery  Therapeutic Interventions: Assess for all discharge needs, 1 to 1 time with Social worker, Explore available resources and support systems, Assess for adequacy in community support network, Educate family and significant other(s) on suicide prevention, Complete Psychosocial Assessment, Interpersonal group therapy.  Evaluation of Outcomes: Progressing   Progress in Treatment: Attending groups: Yes. Participating in groups: Yes. Taking medication as prescribed:  Yes. Toleration medication: Yes. Family/Significant other contact made: No, will contact:  Pam Drown, aunt Patient understands diagnosis: Yes. Discussing patient identified problems/goals with staff: Yes. Medical problems stabilized or resolved: Yes. Denies suicidal/homicidal ideation: Yes. Issues/concerns per patient self-inventory: Yes. Other: NA  New problem(s) identified: No, Describe:  None reported  New Short Term/Long Term Goal(s): "Pills won't help me cope, I need to get my ass back in gear"  Patient Goals:  "Pills won't help me cope, I need to get my ass back in gear"  Discharge Plan or Barriers: Return home and follow up with ACTT team  Reason for Continuation of Hospitalization: Medication stabilization  Estimated Length of Stay: 3-5 days   Recreational Therapy: Patient Stressors: N/A Patient Goal: Patient will engage in groups without prompting or encouragement from LRT x3 group sessions within 5 recreation therapy group sessions  Attendees: Patient: Sylvia [name] 12/08/2017 11:52 AM  Physician: Dr. Jennet Maduro, MD 12/08/2017 11:52 AM  Nursing:  12/08/2017 11:52 AM  RN Care Manager: 12/08/2017 11:52 AM  Social Worker: Johny Shears, LCSWA 12/08/2017 11:52 AM  Recreational Therapist: Danella Deis. Dreama Saa, LRT 12/08/2017 11:52 AM  Other: Lowella Dandy, LCSW 12/08/2017 11:52 AM  Other:  Damian Leavell, Chaplain 12/08/2017 11:52 AM  Other: 12/08/2017 11:52 AM    Scribe for Treatment Team: Suzan Slick, LCSW 12/08/2017 11:52 AM

## 2017-12-08 NOTE — Progress Notes (Signed)
   12/08/17 1200  15 Minute Checks  Reason for Precaution Safety  Location Dayroom-No Interaction (having lunch)  Environmental Check No potentially dangerous items visible

## 2017-12-08 NOTE — Progress Notes (Signed)
Recreation Therapy Notes  INPATIENT RECREATION THERAPY ASSESSMENT  Patient Details Name: CHINITA SCHIMPF MRN: [id number] DOB: [birth year] Today's Date: 12/08/2017       Information Obtained From: Patient  Able to Participate in Assessment/Interview: Yes  Patient Presentation: Responsive  Reason for Admission (Per Patient): Active Symptoms, Suicidal Ideation, Suicide Attempt  Patient Stressors:    Coping Skills:   Other (Comment)(Clean up, Organize)  Leisure Interests (2+):  Exercise - Walking(Patient expresses that she does not do much because it is hard to get around and she does not have anyone to hang out with or talk to)  Frequency of Recreation/Participation: Weekly  Awareness of Community Resources:  Yes  Community Resources:  Park  Current Use: No  If no, Barriers?: Transportation  Expressed Interest in [employer] Information: Yes  [place]  Patient Main Form of Transportation: Walk  Patient Strengths:  I can make people smile  Patient Identified Areas of Improvement:  Calm down; stop stressing about things I can not control  Patient Goal for Hospitalization:  To work on my arm  Current SI (including self-harm):  No  Current HI:  No  Current AVH: No(I am just having flash backs)  Staff Intervention Plan: Group Attendance, Collaborate with Interdisciplinary Treatment Team  Consent to Intern Participation: N/A  Tarynn Garling 12/08/2017, 4:08 PM

## 2017-12-08 NOTE — Progress Notes (Signed)
   12/08/17 1030  Clinical Encounter Type  Visited With Patient;Health care provider  Visit Type Initial;Spiritual support;Behavioral Health  Referral From Social work  Consult/Referral To Chaplain  Spiritual Encounters  Spiritual Needs Emotional;Other (Comment)   Ch attended the patient's treatment team meeting. The patient complained about her cast that she has on her left hand up to her elbow. She complains that it is too tight. The patient stated that she likes to wash her hands when ever possible. The MD called this OCD which she complied with. Patient does not feel that she needs more medication but rather "needs to just get her ass in gear." The patient does have a daughter who is cared for my her aunt who is also her guardian. I observed the patient as very nervous but very logical in her speech.

## 2017-12-08 NOTE — Progress Notes (Signed)
   12/08/17 1800  15 Minute Checks  Reason for Precaution Safety  Location Room (to put on sling)  Environmental Check No potentially dangerous items visible

## 2017-12-08 NOTE — Progress Notes (Signed)
Patient got up and went to the dayroom for vital signs. Returned to her room and currently in bed, awake. Safety monitored per 1:1 observations.

## 2017-12-08 NOTE — Progress Notes (Signed)
Currently in bed sleeping. No sign of distress. Safety maintained on 1:1

## 2017-12-09 LAB — CREATININE, SERUM
Creatinine, Ser: 0.63 mg/dL (ref 0.44–1.00)
GFR calc Af Amer: >60 mL/min (ref >60)
GFR calc non Af Amer: >60 mL/min (ref >60)

## 2017-12-09 MED ORDER — FLUVOXAMINE MALEATE 50 MG PO TABS
50.0000 mg | ORAL_TABLET | Freq: Every day | ORAL | Status: DC
  Administered 2017-12-09: 50 mg via ORAL
  Filled 2017-12-09: qty 1

## 2017-12-09 MED ORDER — LEVETIRACETAM 250 MG PO TABS
250.0000 mg | ORAL_TABLET | Freq: Every day | ORAL | Status: DC
  Administered 2017-12-10: 250 mg via ORAL
  Filled 2017-12-09 (×3): qty 1

## 2017-12-09 MED ORDER — GABAPENTIN 300 MG PO CAPS
600.0000 mg | ORAL_CAPSULE | Freq: Two times a day (BID) | ORAL | Status: DC
  Administered 2017-12-09 – 2017-12-11 (×4): 600 mg via ORAL
  Filled 2017-12-09 (×5): qty 2

## 2017-12-09 MED ORDER — RISPERIDONE 1 MG PO TABS
4.0000 mg | ORAL_TABLET | Freq: Every day | ORAL | Status: DC
  Filled 2017-12-09: qty 4

## 2017-12-09 NOTE — Progress Notes (Signed)
Patient in bed and remains asleep. Safety monitored on 1:1

## 2017-12-09 NOTE — Progress Notes (Signed)
Remains asleep and safety maintained on 1:1

## 2017-12-09 NOTE — Progress Notes (Signed)
Patient ID: Sylvia [name], female   DOB: 11-Apr-1985, 32 y.o.   MRN: [id number] PER STATE REGULATIONS 482.30  THIS CHART WAS REVIEWED FOR MEDICAL NECESSITY WITH RESPECT TO THE PATIENT'S ADMISSION/ DURATION OF STAY.  NEXT REVIEW DATE: 12/13/2017 Willa Rough, RN, BSN CASE MANAGER

## 2017-12-09 NOTE — Progress Notes (Signed)
Recreation Therapy Notes   Date: 12/09/2017  Time: 9:30 am  Location: Craft Room  Behavioral response: Appropriate  Intervention Topic: Emotions  Discussion/Intervention:  Group content on today was focused on emotions. The group identified what emotions are and why it is important to have emotions. Patients expressed some positive and negative emotions. Individuals gave some past experiences on how they normally dealt with emotions in the past. The group described some positive ways to deal with emotions in the future. Patients participated in the intervention "The Situation" where individuals were given a chance to respond to certain situations involving their emotions.  Clinical Observations/Feedback:  Patient came to group and explained that it seems like you can't have to many emotions with certain people. She identified walking and witting as things she does to express her emotions. Individual was social with peers and staff while participating in the intervention.  [name] LRT/CTRS          Jerilee Space 12/09/2017 11:27 AM

## 2017-12-09 NOTE — [facility] Group Notes (Signed)
  LCSW Group Therapy Note  Tuesday Oct 22/19 at 1:00pm  Type of Therapy/Topic:  Group Therapy:  Feelings about Diagnosis  Participation Level: Good participation level  Description of Group:   This group will allow patients to explore their thoughts and feelings about diagnoses they have received. Patients will be guided to explore their level of understanding and acceptance of these diagnoses. Facilitator will encourage patients to process their thoughts and feelings about the reactions of others to their diagnosis and will guide patients in identifying ways to discuss their diagnosis with significant others in their lives. This group will be process-oriented, with patients participating in exploration of their own experiences, giving and receiving support, and processing challenge from other group members.   Therapeutic Goals: 1. Patient will demonstrate understanding of diagnosis as evidenced by identifying two or more symptoms of the disorder 2. Patient will be able to express two feelings regarding the diagnosis 3. Patient will demonstrate their ability to communicate their needs through discussion and/or role play  Summary of Patient Progress:  This patient was quiet at first but had good insightful comments about symptoms of her diagnosis. She was able to relate to others as they expressed their thoughts and feeling. She too was very patient with Corrie Dandy.   Therapeutic Modalities:   Cognitive Behavioral Therapy Brief Therapy Feelings Identification    Sherma Vanmetre LCSW [phone]

## 2017-12-09 NOTE — Plan of Care (Signed)
Thought process improving but patient continues to experience disorganized thinking and behaviors

## 2017-12-09 NOTE — Progress Notes (Signed)
Patient in bed sleeping. Safety maintained on 1:1 

## 2017-12-09 NOTE — Progress Notes (Signed)
Received Sylvia [name] this AM after breakfast with her one to one at the bedside. She was compliant with her medications. Her cast remains on her left arm, warm to touch, color good and movement without swelling.  She denied all of the psychiatric symptoms.

## 2017-12-09 NOTE — [facility] Counselor (Signed)
CSW sent consents to patients guardian.  Johny Shears, MSW, Theresia Majors, Bridget Hartshorn Clinical Social Worker 12/09/2017 2:10 PM
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
## 2017-12-09 NOTE — Progress Notes (Signed)
Remains asleep, sitter at bedside. 

## 2017-12-09 NOTE — Progress Notes (Signed)
Patient slept throughout the night  And appeared to be comfortable in bed. Wake up for morning routine and currently in sitting in room. Pleasant and cooperative. Reports that she slept well. Thought process organized at this moment. Safety monitored on 1:1.

## 2017-12-09 NOTE — [facility] Suicide Risk Assessment (Signed)
[facility] INPATIENT:  Family/Significant Other Suicide Prevention Education  Suicide Prevention Education:  Education Completed; Pam Drown, aunt and legal guardian, [phone] has been identified by the patient as the family member/significant other with whom the patient will be residing, and identified as the person(s) who will aid the patient in the event of a mental health crisis (suicidal ideations/suicide attempt).  With written consent from the patient, the family member/significant other has been provided the following suicide prevention education, prior to the and/or following the discharge of the patient.  The suicide prevention education provided includes the following:  Suicide risk factors  Suicide prevention and interventions  National Suicide Hotline telephone number  [facility] assessment telephone number  [facility] Emergency Assistance 911  [facility] and/or Residential Mobile Crisis Unit telephone number  Request made of family/significant other to:  Remove weapons (e.g., guns, rifles, knives), all items previously/currently identified as safety concern.    Remove drugs/medications (over-the-counter, prescriptions, illicit drugs), all items previously/currently identified as a safety concern.  The family member/significant other verbalizes understanding of the suicide prevention education information provided.  The family member/significant other agrees to remove the items of safety concern listed above.   Her aunt reports that there are no concerns at the moment. She reports hat the patient is in good sprits.She reports that she is a flight attendant and will be out of town this weekend. She is in town this weekend and the next and will be able to come pick up the patient at discharge. She reports that the patient has her own apartment and will be returning there at discharge. She works with [employer]. She reports that the  patient does not have any access to any guns/weapons. All questions were answered and all concerns were addressed.   Johny Shears 12/09/2017, 1:39 PM

## 2017-12-09 NOTE — Progress Notes (Signed)
Patient remains asleep. Safety monitored on 1:1

## 2017-12-10 MED ORDER — HYDROXYZINE HCL 50 MG PO TABS: 50.0000 mg | ORAL_TABLET | Freq: Three times a day (TID) | ORAL | 1 refills | Status: AC

## 2017-12-10 MED ORDER — TRAZODONE HCL 50 MG PO TABS: 50.0000 mg | ORAL_TABLET | Freq: Every day | ORAL | 1 refills | Status: AC

## 2017-12-10 MED ORDER — FLUVOXAMINE MALEATE 100 MG PO TABS: 100.0000 mg | ORAL_TABLET | Freq: Every day | ORAL | 1 refills | Status: AC

## 2017-12-10 MED ORDER — FLUVOXAMINE MALEATE 50 MG PO TABS
100.0000 mg | ORAL_TABLET | Freq: Every day | ORAL | Status: DC
  Administered 2017-12-10: 100 mg via ORAL
  Filled 2017-12-10: qty 2

## 2017-12-10 MED ORDER — LEVETIRACETAM 250 MG PO TABS: 250.0000 mg | ORAL_TABLET | Freq: Every day | ORAL | 1 refills | Status: AC

## 2017-12-10 MED ORDER — VITAMIN D 1000 UNITS PO TABS: 1000.0000 [IU] | ORAL_TABLET | Freq: Every day | ORAL | 1 refills | Status: AC

## 2017-12-10 MED ORDER — ARIPIPRAZOLE 20 MG PO TABS: 20.0000 mg | ORAL_TABLET | Freq: Every day | ORAL | 1 refills | Status: AC

## 2017-12-10 MED ORDER — DOCUSATE SODIUM 100 MG PO CAPS: 100.0000 mg | ORAL_CAPSULE | Freq: Two times a day (BID) | ORAL | 0 refills | Status: AC

## 2017-12-10 MED ORDER — ARIPIPRAZOLE 10 MG PO TABS
20.0000 mg | ORAL_TABLET | Freq: Every day | ORAL | Status: DC
  Administered 2017-12-10 – 2017-12-11 (×2): 20 mg via ORAL
  Filled 2017-12-10 (×3): qty 2

## 2017-12-10 MED ORDER — GABAPENTIN 300 MG PO CAPS: 600.0000 mg | ORAL_CAPSULE | Freq: Two times a day (BID) | ORAL | 1 refills | Status: AC

## 2017-12-10 NOTE — [facility] Suicide Risk Assessment (Signed)
[facility] Discharge Suicide Risk Assessment   Principal Problem: Schizoaffective disorder, bipolar type [name]) Discharge Diagnoses:  Patient Active Problem List   Diagnosis Date Noted  . Schizoaffective disorder, bipolar type (H[facility]) [F25.0] 12/05/2017    Priority: High  . Tobacco use disorder [F17.200] 12/07/2017  . SIRS (systemic inflammatory response syndrome) ([facility]) [R65.10] 12/02/2017  . Overdose [T50.901A] 12/02/2017  . Radius fracture [S52.90XA] 11/22/2017  . Substance induced mood disorder ([facility]) [F19.94] 07/24/2016  . 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 Cocaine use disorder, moderate, dependence ([facility]) [F14.20] 03/17/2015  . Cannabis use disorder, mild, abuse [F12.10] 03/17/2015  . Alcohol use disorder, severe, dependence ([facility]) [F10.20] 03/17/2015  . Seizure disorder (H[facility]) [G40.909] 09/17/2013  . Cellulitis and abscess of toe of right foot [L03.031, L02.611] 09/16/2013  . Hepatitis C [B19.20]   . CP (cerebral palsy) (H[facility]) [G80.9]     Total Time spent with patient: 20 minutes  Musculoskeletal: Strength & Muscle Tone: within normal limits Gait & Station: normal Patient leans: N/A  Psychiatric Specialty Exam: Review of Systems  Neurological: Negative.   Psychiatric/Behavioral: Negative.   All other systems reviewed and are negative.   Blood pressure 107/82, pulse 80, temperature 97.6 F (36.4 C), temperature source Oral, resp. rate 16, height 5\' 4"  (1.626 m), weight 58.5 kg, SpO2 100 %.Body mass index is 22.14 kg/m.  General Appearance: Casual  Eye Contact::  Good  Speech:  Clear and Coherent409  Volume:  Normal  Mood:  Euthymic  Affect:  Appropriate  Thought Process:  Goal Directed and Descriptions of Associations: Intact  Orientation:  Full (Time, Place, and Person)  Thought Content:  Delusions and Paranoid Ideation  Suicidal Thoughts:  No  Homicidal Thoughts:  No  Memory:  Immediate;   Fair Recent;   Fair Remote;   Fair  Judgement:  Poor   Insight:  Lacking  Psychomotor Activity:  Normal  Concentration:  Fair  Recall:  [employer] of Knowledge:Fair  Language: Fair  Akathisia:  No  Handed:  Right  AIMS (if indicated):     Assets:  Communication Skills Desire for Improvement Financial Resources/Insurance Housing Physical Health Resilience Social Support  Sleep:  Number of Hours: 6.5  Cognition: WNL  ADL's:  Intact   Mental Status Per Nursing Assessment::   On Admission:  NA  Demographic Factors:  Caucasian and Living alone  Loss Factors: NA  Historical Factors: Prior suicide attempts and Impulsivity  Risk Reduction Factors:   Responsible for children under 35 years of age, Positive social support and Positive therapeutic relationship  Continued Clinical Symptoms:  Schizophrenia:   Less than 54 years old Paranoid or undifferentiated type  Cognitive Features That Contribute To Risk:  None    Suicide Risk:  Minimal: No identifiable suicidal ideation.  Patients presenting with no risk factors but with morbid ruminations; may be classified as minimal risk based on the severity of the depressive symptoms  Follow-up Information    [facility] Follow up.   Contact information: [address] [place] [address] [phone]           Plan Of Care/Follow-up recommendations:  Activity:  as tolerated Diet:  low sodium heart healthy Other:  keep follow up appointments  Kristine Linea, MD 12/11/2017, 10:12 AM

## 2017-12-10 NOTE — Plan of Care (Signed)
  Problem: Coping: Goal: Ability to verbalize frustrations and anger appropriately will improve Outcome: Progressing  Patient able to verbalize frustrations to staff.  

## 2017-12-10 NOTE — [facility] Group Notes (Signed)
[facility] Group Notes:  (Nursing/MHT/Case Management/Adjunct)  Date:  12/10/2017  Time:  9:48 PM  Type of Therapy:  Group Therapy  Participation Level:  Active  Participation Quality:  Attentive  Affect:  Appropriate  Cognitive:  Oriented  Insight:  Good  Engagement in Group:  Engaged  Modes of Intervention:  Socialization Patient was engaged and attentive, listening to what her peers are saying, shows positive emotions with laughter, did not respond to any of the conversations, but was cheerful.  Summary of Progress/Problems:  Lelan Pons 12/10/2017, 9:48 PM

## 2017-12-10 NOTE — Progress Notes (Signed)
Recreation Therapy Notes   Date: 12/10/2017  Time: 9:30 am  Location: Craft Room  Behavioral response: Appropriate  Intervention Topic: Communication  Discussion/Intervention:  Group content today was focused on communication. The group defined communication and ways to communicate with others. Individuals stated reason why communication is important and some reasons to communicate with others. Patients expressed if they thought they were good at communicating with others and ways they could improve their communication skills. The group identified important parts of communication and some experiences they have had in the past with communication. The group participated in the intervention "What is that?", where they had a chance to test out their communication skills and identify ways to improve their communication techniques.  Clinical Observations/Feedback:  Patient came to group late due to unknown reasons. Individual was social peers and staff while participating in the intervention. Trysten Berti LRT/CTRS           Ridley Dileo 12/10/2017 12:36 PM

## 2017-12-10 NOTE — Progress Notes (Addendum)
Received Tris this AM after breakfast, she was compliant with her medications.She denied all of the psychiatric symptoms and feels safe to go home tomorrow. She has been OOB in the milieu most of the day and in the courtyard with her peers. On her self inventory sheet she rated depression, hopeless and anxiety 2/10. She received her discharge order for 12/11/17.

## 2017-12-10 NOTE — [facility] Group Notes (Signed)
  Emotional Regulation October 23 1PM  Type of Therapy/Topic:  Group Therapy:  Emotion Regulation  Participation Level: Good particpation   Description of Group:   The purpose of this group is to assist patients in learning to regulate negative emotions and experience positive emotions. Patients will be guided to discuss ways in which they have been vulnerable to their negative emotions. These vulnerabilities will be juxtaposed with experiences of positive emotions or situations, and patients will be challenged to use positive emotions to combat negative ones. Special emphasis will be placed on coping with negative emotions in conflict situations, and patients will process healthy conflict resolution skills.  Therapeutic Goals: 1. Patient will identify two positive emotions or experiences to reflect on in order to balance out negative emotions 2. Patient will label two or more emotions that they find the most difficult to experience 3. Patient will demonstrate positive conflict resolution skills through discussion and/or role plays  Summary of Patient Progress:   This patient attended group ,was able to follow content and laugh at situations and be supportive of her peers when they disclosed some negative emotions.    Therapeutic Modalities:   Cognitive Behavioral Therapy Feelings Identification Dialectical Behavioral Therapy   Birdena Kingma LCSW 2:15 pm

## 2017-12-10 NOTE — Progress Notes (Signed)
Patient continues to be on 1:1, affect is blunted, angry and demanding, very hostile  towards staff using profanities, thoughts are disorganized and speech is tangential, she  insisted she's not tsking all her medication and was charging towards the nurse in anger. Patient was redirected and the aggression was deescalated, patient makes fair eye contact, cast intact to left wrist, capillary refill less than 3 seconds to left fingers, able to move extremities, no swelling, noted will continue to monitor.

## 2017-12-10 NOTE — Discharge Summary (Addendum)
Physician Discharge Summary Note  Patient:  Sylvia [name] is an 32 y.o., female MRN:  [id number] DOB:  05-11-1985 Patient phone:  [phone] (home)  Patient address:   [address] [place] [address],  Total Time spent with patient: 20 minutes plus 15 min on care coordination and documentation  Date of Admission:  12/05/2017 Date of Discharge: 12/11/2017  Reason for Admission:  Overdose.  History of Present Illness:  "Nobody helping me to call [employer]"  Pt is 32 year old woman with a history of alcohol abuse cerebral palsy intellectual disability possible schizophrenia, admitted under IVC. Per report pt  who comes into the hospital because she took a overdose of multiple medicines. Family member reported that the patient had been intoxicated and took a full 7-day supply of all of her medications at once ,  made suicidal statements.  the patient also removed a cast that had recently been applied to her broken arm. Pt on 1:1, pt demanding to release her, focussed with contacting [employer] for her apartment. Pt  denies suicidal ideation at this time, mood is labile and irritable. Speech is  rapid , pressured and disorganized. Patient lives with an aunt who is her closest relative and might be her guardian.  Medical history: recent Overdose . History of hepatitis C history of cerebral palsy recent fracture of her arm  Substance abuse history: Long-standing alcohol abuse with heavy complications although no history of DTs she does appear to have had alcohol withdrawal seizures.  Past Psychiatric History:  h/o  of schizophrenia although the patient in more recent times has not shown symptoms of schizophrenia so much as intellectual disability and chronic heavy alcohol abuse with cognitive impairment. No  prior suicide attempts.  Family Psychiatric  History: mother- bipolar  Social History: Incompetent adult. Her aunt, Ander Slade, is the guardian.  Lives independently in [place] but will shortly move to [place] with the help of Multimedia programmer. She has a daughter who is adopted by her aunt.   Principal Problem: Schizoaffective disorder, bipolar type [facility] : [facility]) Discharge Diagnoses: Patient Active Problem List   Diagnosis Date Noted  . Schizoaffective disorder, bipolar type (HCC) [F25.0] 12/05/2017    Priority: High  . Tobacco use disorder [F17.200] 12/07/2017  . SIRS (systemic inflammatory response syndrome) (HCC) [R65.10] 12/02/2017  . Overdose [T50.901A] 12/02/2017  . Radius fracture [S52.90XA] 11/22/2017  . Substance induced mood disorder (HCC) [F19.94] 07/24/2016  . 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 Cocaine use disorder, moderate, dependence (HCC) [F14.20] 03/17/2015  . Cannabis use disorder, mild, abuse [F12.10] 03/17/2015  . Alcohol use disorder, severe, dependence (HCC) [F10.20] 03/17/2015  . Seizure disorder (HCC) [G40.909] 09/17/2013  . Cellulitis and abscess of toe of right foot [L03.031, L02.611] 09/16/2013  . Hepatitis C [B19.20]   . CP (cerebral palsy) (HCC) [G80.9]    Past Medical History:  Past Medical History:  Diagnosis Date  . Alcoholism (HCC)   . CP (cerebral palsy) (HCC)   . Hepatitis C   . Herpes   . Polysubstance abuse (HCC)   . Schizoaffective schizophrenia (HCC) 09/17/2013   Per [facility] from [facility] as on 2014   . Seizures (HCC)   . Stroke [facility])     Past Surgical History:  Procedure Laterality Date  . AMPUTATION Right 09/17/2013   Procedure: PARTIAL AMPUTATION 2ND TOE RIGHT FOOT;  Surgeon: Dallas Schimke, DPM;  Location: [facility];  Service: Podiatry;  Laterality: Right;  . FOOT SURGERY  rt and left  . LEG SURGERY Right    x7  . OPEN REDUCTION INTERNAL FIXATION (ORIF) DISTAL RADIAL FRACTURE Left 11/22/2017   Procedure: OPEN REDUCTION INTERNAL FIXATION (ORIF) DISTAL RADIAL FRACTURE and carpal tunnel release;  Surgeon: Juanell Fairly, MD;   Location: [facility] ORS;  Service: Orthopedics;  Laterality: Left;  . ORIF ULNAR FRACTURE Left 12/04/2017   Procedure: OPEN REDUCTION INTERNAL FIXATION (ORIF) ULNAR FRACTURE;  Surgeon: Juanell Fairly, MD;  Location: [facility] ORS;  Service: Orthopedics;  Laterality: Left;   Family History:  Family History  Problem Relation Age of Onset  . Bipolar disorder Mother   . Cancer Maternal Grandmother   . Diabetes Maternal Grandfather   . Hypertension Maternal Grandfather    Social History:  Social History   Substance and Sexual Activity  Alcohol Use Yes  . Alcohol/week: 3.0 standard drinks  . Types: 3 Cans of beer per week   Comment: Per patient 0.5 gallon to gallon when she can but since being in group home she can't do it as often      Social History   Substance and Sexual Activity  Drug Use Yes  . Types: "Crack" cocaine, Marijuana, Cocaine   Comment: Per patient uses herion and crack and cocaine "once in a blue moon"    Social History   Socioeconomic History  . Marital status: Single    Spouse name: Not on file  . Number of children: Not on file  . Years of education: Not on file  . Highest education level: Not on file  Occupational History  . Not on file  Social Needs  . Financial resource strain: Not on file  . Food insecurity:    Worry: Not on file    Inability: Not on file  . Transportation needs:    Medical: Not on file    Non-medical: Not on file  Tobacco Use  . Smoking status: Current Every Day Smoker    Packs/day: 1.00    Years: 20.00    Pack years: 20.00    Types: Cigarettes  . Smokeless tobacco: Never Used  Substance and Sexual Activity  . Alcohol use: Yes    Alcohol/week: 3.0 standard drinks    Types: 3 Cans of beer per week    Comment: Per patient 0.5 gallon to gallon when she can but since being in group home she can't do it as often   . Drug use: Yes    Types: "Crack" cocaine, Marijuana, Cocaine    Comment: Per patient uses herion and crack and cocaine  "once in a blue moon"  . Sexual activity: Not Currently    Birth control/protection: Implant  Lifestyle  . Physical activity:    Days per week: Not on file    Minutes per session: Not on file  . Stress: Not on file  Relationships  . Social connections:    Talks on phone: Not on file    Gets together: Not on file    Attends religious service: Not on file    Active member of club or organization: Not on file    Attends meetings of clubs or organizations: Not on file    Relationship status: Not on file  Other Topics Concern  . Not on file  Social History Narrative   Lives at home by herself, only has acquaintances here   Does not work     Hospital Course:   Ms. Hanif is a 32 year old female with a history of schizoaffective disorder, cerebral palsy,  and substance abuse admitted after overdose. She refused injectable antipsychotics but accepted oral Abilify. She is calm and cooperrative but paranoid at baseline. At the time of discharge, the patient is no longer suicidal or homicidal. She is able to contract for safety. She is forward thinking and optimistic about the future.  #Mood, improved -continue Abilify 20 mg daily for psychosis and mood stabilization -continue Luvox 100 mg for OCD and PTSD -refuses Abilify maintena  #Alcohol abuse -VS are stable, no signs of withdrawal -minimizes problems and declines treatment  #Seizures -continue Keppra250 mg nightly -continue Neurontinto 600 mg BID  #Smoking cessation -nicotine patch is available  #Disposition -discharge with family -follow upwith [facility] ACT team  Physical Findings: AIMS: Facial and Oral Movements Muscles of Facial Expression: None, normal Lips and Perioral Area: None, normal Jaw: None, normal Tongue: None, normal,Extremity Movements Upper (arms, wrists, hands, fingers): None, normal Lower (legs, knees, ankles, toes): None, normal, Trunk Movements Neck, shoulders, hips: None,  normal, Overall Severity Severity of abnormal movements (highest score from questions above): None, normal Incapacitation due to abnormal movements: None, normal Patient's awareness of abnormal movements (rate only patient's report): No Awareness, Dental Status Current problems with teeth and/or dentures?: (S) Yes(patient has cavities) Does patient usually wear dentures?: No  CIWA:  CIWA-Ar Total: 0 COWS:  COWS Total Score: 1  Musculoskeletal: Strength & Muscle Tone: within normal limits Gait & Station: normal Patient leans: N/A  Psychiatric Specialty Exam: Physical Exam  Nursing note and vitals reviewed. Psychiatric: She has a normal mood and affect. Her speech is normal and behavior is normal. Thought content is paranoid and delusional. Cognition and memory are normal. She expresses impulsivity.    Review of Systems  Neurological: Negative.   Psychiatric/Behavioral: Negative.   All other systems reviewed and are negative.   Blood pressure 107/82, pulse 80, temperature 97.6 F (36.4 C), temperature source Oral, resp. rate 16, height 5\' 4"  (1.626 m), weight 58.5 kg, SpO2 100 %.Body mass index is 22.14 kg/m.  General Appearance: Casual  Eye Contact:  Good  Speech:  Clear and Coherent  Volume:  Normal  Mood:  Euthymic  Affect:  Appropriate  Thought Process:  Goal Directed and Descriptions of Associations: Intact  Orientation:  Full (Time, Place, and Person)  Thought Content:  Delusions and Paranoid Ideation  Suicidal Thoughts:  No  Homicidal Thoughts:  No  Memory:  Immediate;   Fair Recent;   Fair Remote;   Fair  Judgement:  Poor  Insight:  Lacking  Psychomotor Activity:  Normal  Concentration:  Concentration: Fair and Attention Span: Fair  Recall:  [employer] of Knowledge:  Fair  Language:  Fair  Akathisia:  No  Handed:  Right  AIMS (if indicated):     Assets:  Communication Skills Desire for Improvement Financial Resources/Insurance Housing Physical  Health Resilience Social Support  ADL's:  Intact  Cognition:  WNL  Sleep:  Number of Hours: 6.5     Have you used any form of tobacco in the last 30 days? (Cigarettes, Smokeless Tobacco, Cigars, and/or Pipes): Yes  Has this patient used any form of tobacco in the last 30 days? (Cigarettes, Smokeless Tobacco, Cigars, and/or Pipes) Yes, Yes, A prescription for an FDA-approved tobacco cessation medication was offered at discharge and the patient refused  Blood Alcohol level:  Lab Results  Component Value Date   ETH 67 (H) 12/02/2017   ETH <10 11/22/2017    Metabolic Disorder Labs:  Lab Results  Component  Value Date   HGBA1C 5.1 12/06/2017   MPG 99.67 12/06/2017   MPG 105 03/18/2015   Lab Results  Component Value Date   PROLACTIN 68.5 (H) 03/18/2015   Lab Results  Component Value Date   CHOL 179 12/06/2017   TRIG 122 12/06/2017   HDL 62 12/06/2017   CHOLHDL 2.9 12/06/2017   VLDL 24 12/06/2017   LDLCALC 93 12/06/2017   LDLCALC 94 03/18/2015    See Psychiatric Specialty Exam and Suicide Risk Assessment completed by Attending Physician prior to discharge.  Discharge destination:  Home  Is patient on multiple antipsychotic therapies at discharge:  No   Has Patient had three or more failed trials of antipsychotic monotherapy by history:  No  Recommended Plan for Multiple Antipsychotic Therapies: NA  Discharge Instructions    Diet - low sodium heart healthy   Complete by:  As directed    Increase activity slowly   Complete by:  As directed      Allergies as of 12/11/2017      Reactions   Diphenhydramine Hcl Rash   Reaction:  Lowers Dilantin level    Fluoxetine Other (See Comments)   Reaction:  Altered mental status    Baclofen Other (See Comments)   Drops blood pressure, per pt   Depakote [divalproex Sodium] Other (See Comments)   Reaction:  Seizures and drops pts BP   Quetiapine Other (See Comments)   Reaction:  Drops pts BP      Medication List     STOP taking these medications   benztropine 0.5 MG tablet Commonly known as:  COGENTIN   citalopram 20 MG tablet Commonly known as:  CELEXA   FLUoxetine 20 MG capsule Commonly known as:  PROZAC   nicotine 21 mg/24hr patch Commonly known as:  NICODERM CQ - dosed in mg/24 hours   oxyCODONE 5 MG immediate release tablet Commonly known as:  Oxy IR/ROXICODONE   risperiDONE 3 MG tablet Commonly known as:  RISPERDAL     TAKE these medications     Indication  ARIPiprazole 20 MG tablet Commonly known as:  ABILIFY Take 1 tablet (20 mg total) by mouth daily. What changed:    medication strength  how much to take  Indication:  Manic Phase of Manic-Depression   cholecalciferol 1000 units tablet Commonly known as:  VITAMIN D Take 1 tablet (1,000 Units total) by mouth daily.  Indication:  general health   docusate sodium 100 MG capsule Commonly known as:  COLACE Take 1 capsule (100 mg total) by mouth 2 (two) times daily.  Indication:  Constipation   fluvoxaMINE 100 MG tablet Commonly known as:  LUVOX Take 1 tablet (100 mg total) by mouth at bedtime.  Indication:  Obsessive Compulsive Disorder, Posttraumatic Stress Disorder   gabapentin 300 MG capsule Commonly known as:  NEURONTIN Take 2 capsules (600 mg total) by mouth 2 (two) times daily. What changed:  how much to take  Indication:  Focal Epilepsy   hydrOXYzine 50 MG tablet Commonly known as:  ATARAX/VISTARIL Take 1 tablet (50 mg total) by mouth 3 (three) times daily.  Indication:  Feeling Anxious   levETIRAcetam 250 MG tablet Commonly known as:  KEPPRA Take 1 tablet (250 mg total) by mouth at bedtime.  Indication:  Tonic-Clonic Seizures   traZODone 50 MG tablet Commonly known as:  DESYREL Take 1 tablet (50 mg total) by mouth at bedtime.  Indication:  Trouble Sleeping      Follow-up [facility] Follow up.   Contact information: [address] [place]  [address] [phone]           Follow-up recommendations:  Activity:  as tolerated Diet:  low sodium heartbhealthy Other:  keep follow up appointments  Comments:    Signed: Kristine Linea, MD 12/11/2017, 11:11 AM

## 2017-12-11 NOTE — Progress Notes (Signed)
Patient is alert and oriented continues to be on 1:1 less irritable no distress noted , thoughts are organized and coherent, interacting appropriately with peers and staff, compliant with medication and attended evening wrap up group.. 15 minutes safety checks maintained.

## 2017-12-11 NOTE — Progress Notes (Signed)
Patient denies SI/HI, denies A/V hallucinations. Patient verbalizes understanding of discharge instructions, follow up care and prescriptions. Patient given all belongings from [facility] locker. Patient escorted out by staff, transported by family. 

## 2017-12-11 NOTE — Progress Notes (Signed)
  [facility] Adult Case Management Discharge Plan :  Will you be returning to the same living situation after discharge:  Yes,  Pt will be returning to your apartment. At discharge, do you have transportation home?: Yes,  Pt's aunt/guardian will be picking her up at discharge. Do you have the ability to pay for your medications: Yes,  Pt has the financial means to pay for her medications.  Release of information consent forms completed and in the chart;  Patient's signature needed at discharge.  Patient to Follow up at: Follow-up Information    [facility] Follow up on 12/10/2017.   Why:  A member of your ACTT will be coming to see you this afternoon around 3PM. Contact information: [address] [place] [place] [address] [phone]           Next level of care provider has access to [facility] Link:no  Safety Planning and Suicide Prevention discussed: Yes,  No safety issues identified.  Have you used any form of tobacco in the last 30 days? (Cigarettes, Smokeless Tobacco, Cigars, and/or Pipes): Yes  Has patient been referred to the Quitline?: Patient refused referral  Patient has been referred for addiction treatment: N/A  Alease Frame, LCSW 12/11/2017, 11:40 AM

## 2017-12-11 NOTE — Progress Notes (Signed)
Recreation Therapy Notes  Date: 12/11/2017  Time: 9:30 am  Location: Craft Room  Behavioral response: Appropriate  Intervention Topic: Problem Solving  Discussion/Intervention:  Group content on today was focused on problem solving. The group described what problem solving is. Patients expressed how problems affect them and how they deal with problems. Individuals identified healthy ways to deal with problems. Patients explained what normally happens to them when they do not deal with problems. The group expressed reoccurring problems for them. The group participated in the intervention "Put the story together" with their peers and worked together to put a story that was broken up in the correct order. Clinical Observations/Feedback:  Patient came to group and stated in order to solve your problems you must identify them. She stated part of problem solving is waiting. Individual was social peers and staff while participating in the intervention. Tyresa Prindiville LRT/CTRS         Katoria Yetman 12/11/2017 11:21 AM

## 2017-12-11 NOTE — Plan of Care (Signed)
  Problem: Coping: Goal: Ability to verbalize frustrations and anger appropriately will improve Outcome: Progressing  Patient verbalized anger and nurse to Clinical research associate.

## 2017-12-11 NOTE — Progress Notes (Signed)
Patient continues to be on 1:1 sitter at bedside, interacting with staff appropriately, patient has a cast on left wrist, patient can wiggle fingers, able to feel sensation around  his hands, capillary refill less than 3 seconds, no distress noted 15 minutes safety checks maintained will continue to monitor.

## 2017-12-11 NOTE — Progress Notes (Signed)
Recreation Therapy Notes  INPATIENT RECREATION TR PLAN  Patient Details Name: Sylvia [name] MRN: [id number] DOB: 01/23/1986 Today's Date: 12/11/2017  Rec Therapy Plan Is patient appropriate for Therapeutic Recreation?: Yes Treatment times per week: at least 3 Estimated Length of Stay: 5-7days TR Treatment/Interventions: Group participation (Comment)  Discharge Criteria Pt will be discharged from therapy if:: Discharged Treatment plan/goals/alternatives discussed and agreed upon by:: Patient/family  Discharge Summary Short term goals set: Patient will engage in groups without prompting or encouragement from LRT x3 group sessions within 5 recreation therapy group sessions Short term goals met: Complete Progress toward goals comments: Groups attended Which groups?: Communication, Other (Comment)(Emotions, Problem Solving) Reason goals not met: N/A Therapeutic equipment acquired: N/A Reason patient discharged from therapy: Discharge from hospital Pt/family agrees with progress & goals achieved: Yes Date patient discharged from therapy: 12/11/17   Camya Haydon 12/11/2017, 11:48 AM

## 2017-12-27 ENCOUNTER — Emergency Department: Payer: Medicare Other

## 2017-12-27 ENCOUNTER — Emergency Department
Admission: EM | Admit: 2017-12-27 | Discharge: 2017-12-27 | Disposition: A | Discharge status: Home / Self Care | Payer: Medicare Other | Attending: Emergency Medicine | Admitting: Emergency Medicine

## 2017-12-27 ENCOUNTER — Other Ambulatory Visit: Payer: Self-pay

## 2017-12-27 DIAGNOSIS — S52502D Unspecified fracture of the lower end of left radius, subsequent encounter for closed fracture with routine healing: Secondary | ICD-10-CM | POA: Diagnosis not present

## 2017-12-27 DIAGNOSIS — Z4802 Encounter for removal of sutures: Secondary | ICD-10-CM | POA: Insufficient documentation

## 2017-12-27 DIAGNOSIS — S51812D Laceration without foreign body of left forearm, subsequent encounter: Secondary | ICD-10-CM | POA: Diagnosis not present

## 2017-12-27 DIAGNOSIS — S52692D Other fracture of lower end of left ulna, subsequent encounter for closed fracture with routine healing: Secondary | ICD-10-CM | POA: Insufficient documentation

## 2017-12-27 DIAGNOSIS — F1010 Alcohol abuse, uncomplicated: Secondary | ICD-10-CM | POA: Insufficient documentation

## 2017-12-27 DIAGNOSIS — F17210 Nicotine dependence, cigarettes, uncomplicated: Secondary | ICD-10-CM | POA: Insufficient documentation

## 2017-12-27 DIAGNOSIS — Y33XXXA Other specified events, undetermined intent, initial encounter: Secondary | ICD-10-CM | POA: Diagnosis not present

## 2017-12-27 DIAGNOSIS — G809 Cerebral palsy, unspecified: Secondary | ICD-10-CM | POA: Diagnosis not present

## 2017-12-27 DIAGNOSIS — Z79899 Other long term (current) drug therapy: Secondary | ICD-10-CM | POA: Diagnosis not present

## 2017-12-27 DIAGNOSIS — Z8673 Personal history of transient ischemic attack (TIA), and cerebral infarction without residual deficits: Secondary | ICD-10-CM | POA: Insufficient documentation

## 2017-12-27 MED ORDER — LORAZEPAM 1 MG PO TABS
1.0000 mg | ORAL_TABLET | Freq: Once | ORAL | Status: AC
  Administered 2017-12-27: 1 mg via ORAL
  Filled 2017-12-27: qty 1

## 2017-12-27 MED ORDER — PENTAFLUOROPROP-TETRAFLUOROETH EX AERO: INHALATION_SPRAY | CUTANEOUS | Status: DC | PRN

## 2017-12-27 NOTE — ED Notes (Signed)

## 2017-12-27 NOTE — ED Triage Notes (Addendum)
Pt states she broke her L arm 4 weeks ago. Took her cast off 3 weeks ago. States she feels like arm is healed and wants cast removed. Denies swelling in fingers. Able to wiggle fingers and move hand. Cap refill less than 2 seconds.   Pt requesting ativan. States "i'm not crazy." states that she was admitted to behavioral last week.   Pt states aunt is her guardian. Joy, [phone]. Her phone is turned off, left a voicemail by this RN. Pt states that Joy dropped her off at ED.

## 2017-12-27 NOTE — ED Provider Notes (Addendum)
[facility] Emergency Department Provider Note  ____________________________________________   I have reviewed the triage vital signs and the nursing notes. Where available I have reviewed prior notes and, if possible and indicated, outside hospital notes.    HISTORY  Chief Complaint Arm Injury    HPI Sylvia [name] is a 32 y.o. female of EtOH abuse several policy hepatitis C seizure disorder, schizoaffective bipolar type, presents today complaining of wanting her cast off.  Patient had a arm fracture and was discharged from the hospital for this on October 7.  She still has the cast on.  She states she does sometimes take the cast off.  She has not followed up with orthopedic surgery and still has the staples in her arm she is worried that they might get infected.  Also, she states that she wants some Ativan in case she goes into withdrawal.  She is not sure exactly when the last time she had alcohol was.  She is not having tremors or hallucinations she has no SI she has no HI, the forearm does hurt and has been hurting since the injury.  Is not clear how long she has been taking the cast off for.  History is quite limited.  States she is having some travel issues that make it difficult for her to get to the orthopedic surgeon however she was able to come in here by private vehicle today and thinks that she could talk to her aunt about taking her there as needed.     Past Medical History:  Diagnosis Date  . Alcoholism (HCC)   . CP (cerebral palsy) (HCC)   . Hepatitis C   . Herpes   . Polysubstance abuse (HCC)   . Schizoaffective schizophrenia (HCC) 09/17/2013   Per notes from [facility] as on 2014   . Seizures (HCC)   . Stroke [facility])     Patient Active Problem List   Diagnosis Date Noted  . Tobacco use disorder 12/07/2017  . Schizoaffective disorder, bipolar type (HCC) 12/05/2017  . SIRS (systemic inflammatory response syndrome) (HCC) 12/02/2017  . Overdose  12/02/2017  . Radius fracture 11/22/2017  . Substance induced mood disorder (HCC) 07/24/2016  . 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 Cocaine use disorder, moderate, dependence (HCC) 03/17/2015  . Cannabis use disorder, mild, abuse 03/17/2015  . Alcohol use disorder, severe, dependence (HCC) 03/17/2015  . Seizure disorder (HCC) 09/17/2013  . Cellulitis and abscess of toe of right foot 09/16/2013  . Hepatitis C   . CP (cerebral palsy) (HCC)     Past Surgical History:  Procedure Laterality Date  . AMPUTATION Right 09/17/2013   Procedure: PARTIAL AMPUTATION 2ND TOE RIGHT FOOT;  Surgeon: Dallas Schimke, DPM;  Location: [facility];  Service: Podiatry;  Laterality: Right;  . FOOT SURGERY     rt and left  . LEG SURGERY Right    x7  . OPEN REDUCTION INTERNAL FIXATION (ORIF) DISTAL RADIAL FRACTURE Left 11/22/2017   Procedure: OPEN REDUCTION INTERNAL FIXATION (ORIF) DISTAL RADIAL FRACTURE and carpal tunnel release;  Surgeon: Juanell Fairly, MD;  Location: [facility] ORS;  Service: Orthopedics;  Laterality: Left;  . ORIF ULNAR FRACTURE Left 12/04/2017   Procedure: OPEN REDUCTION INTERNAL FIXATION (ORIF) ULNAR FRACTURE;  Surgeon: Juanell Fairly, MD;  Location: [facility] ORS;  Service: Orthopedics;  Laterality: Left;    Prior to Admission medications   Medication Sig Start Date End Date Taking? Authorizing Provider  ARIPiprazole (ABILIFY) 20 MG tablet Take 1 tablet (20 mg  total) by mouth daily. 12/10/17   Pucilowska, Braulio Conte B, MD  cholecalciferol (VITAMIN D) 1000 units tablet Take 1 tablet (1,000 Units total) by mouth daily. 12/10/17   Pucilowska, Braulio Conte B, MD  docusate sodium (COLACE) 100 MG capsule Take 1 capsule (100 mg total) by mouth 2 (two) times daily. 12/10/17   Pucilowska, Jolanta B, MD  fluvoxaMINE (LUVOX) 100 MG tablet Take 1 tablet (100 mg total) by mouth at bedtime. 12/10/17   Pucilowska, Braulio Conte B, MD  gabapentin (NEURONTIN) 300 MG capsule Take 2 capsules (600 mg total) by  mouth 2 (two) times daily. 12/10/17   Pucilowska, Braulio Conte B, MD  hydrOXYzine (ATARAX/VISTARIL) 50 MG tablet Take 1 tablet (50 mg total) by mouth 3 (three) times daily. 12/10/17   Pucilowska, Braulio Conte B, MD  levETIRAcetam (KEPPRA) 250 MG tablet Take 1 tablet (250 mg total) by mouth at bedtime. 12/10/17   Pucilowska, Braulio Conte B, MD  traZODone (DESYREL) 50 MG tablet Take 1 tablet (50 mg total) by mouth at bedtime. 12/10/17   Pucilowska, Ellin Goodie, MD    Allergies Diphenhydramine hcl; Fluoxetine; Baclofen; Depakote [divalproex sodium]; and Quetiapine  Family History  Problem Relation Age of Onset  . Bipolar disorder Mother   . Cancer Maternal Grandmother   . Diabetes Maternal Grandfather   . Hypertension Maternal Grandfather     Social History Social History   Tobacco Use  . Smoking status: Current Every Day Smoker    Packs/day: 1.00    Years: 20.00    Pack years: 20.00    Types: Cigarettes  . Smokeless tobacco: Never Used  Substance Use Topics  . Alcohol use: Yes    Alcohol/week: 3.0 standard drinks    Types: 3 Cans of beer per week    Comment: Per patient 0.5 gallon to gallon when she can but since being in group home she can't do it as often   . Drug use: Yes    Types: "Crack" cocaine, Marijuana, Cocaine    Comment: Per patient uses herion and crack and cocaine "once in a blue moon"    Review of Systems Constitutional: No fever/chills Eyes: No visual changes. ENT: No sore throat. No stiff neck no neck pain Cardiovascular: Denies chest pain. Respiratory: Denies shortness of breath. Gastrointestinal:   no vomiting.  No diarrhea.  No constipation. Genitourinary: Negative for dysuria. Musculoskeletal: Negative lower extremity swelling Skin: Negative for rash. Neurological: Negative for severe headaches, focal weakness or numbness.   ____________________________________________   PHYSICAL EXAM:  VITAL SIGNS: ED Triage Vitals [12/27/17 1544]  Enc Vitals Group     BP  108/77     Pulse Rate 91     Resp 16     Temp 98.4 F (36.9 C)     Temp Source Oral     SpO2 99 %     Weight 150 lb (68 kg)     Height 5\' 4"  (1.626 m)     Head Circumference      Peak Flow      Pain Score 5     Pain Loc      Pain Edu?      Excl. in GC?     Constitutional: Patient is sound asleep on the bed, wakes up with no difficulty Eyes: Conjunctivae are normal Head: Atraumatic HEENT: No congestion/rhinnorhea. Mucous membranes are moist.  Oropharynx non-erythematous Neck:   Nontender with no meningismus, no masses, no stridor Cardiovascular: Normal rate, regular rhythm. Grossly normal heart sounds.  Good peripheral circulation. Respiratory: Normal  respiratory effort.  No retractions. Lungs CTAB. Abdominal: Soft and nontender. No distention. No guarding no rebound Back:  There is no focal tenderness or step off.  there is no midline tenderness there are no lesions noted. there is no CVA tenderness Musculoskeletal: No lower extremity tenderness, patient has a cast on which is a little bit loose, she is able to pull it off which she does for me, she states she is done that before.  There is some degree of deformity to the forearm, she has good pulses, it seems to be tender in these areas but it does not appear to be infected, the both do not appear to be red or inflamed and there is scar tissue around them.  Sensation and strength appear to be intact, it does hurt her to range her wrist.  Patient compliant with exam presents some difficulty in accurate assessment. No joint effusions, no DVT signs strong distal pulses no edema Neurologic:  Normal speech and language. No gross focal neurologic deficits are appreciated.  Skin:  Skin is warm, dry and intact. No rash noted. Psychiatric: Mood and affect are normal. Speech and behavior are normal.  ____________________________________________   LABS (all labs ordered are listed, but only abnormal results are displayed)  Labs Reviewed -  No data to display  Pertinent labs  results that were available during my care of the patient were reviewed by me and considered in my medical decision making (see chart for details). ____________________________________________  EKG  I personally interpreted any EKGs ordered by me or triage  ____________________________________________  RADIOLOGY  Pertinent labs & imaging results that were available during my care of the patient were reviewed by me and considered in my medical decision making (see chart for details). If possible, patient and/or family made aware of any abnormal findings.  No results found. ____________________________________________    PROCEDURES  Procedure(s) performed: None  Procedures  Critical Care performed: None  ____________________________________________   INITIAL IMPRESSION / ASSESSMENT AND PLAN / ED COURSE  Pertinent labs & imaging results that were available during my care of the patient were reviewed by me and considered in my medical decision making (see chart for details).  Patient here with a cast is been on since October 7 or so, today is 49 9, it is been on there for a little over a month.  Is probably not long enough.  However, the staples will need to be removed.  I will get an x-ray to make sure that her activities without the cast have not resulted in any significant disunion and to further assess for healing, I will talk to orthopedic surgeon but is my hope that we can remove her staples, the cast does fit her we can probably rewrap her and put the cast back on after the staples are off we will have her follow closely with orthopedic surgery.  There is no guarantee she will follow-up but we have stressed that she must.  The other option is a splint however the cast will likely provide her with better support and protection.  ----------------------------------------- 4:32 PM on 12/27/2017 -----------------------------------------  Of  note, there is no clinical evidence to support that the patient is actually in alcohol withdrawal.  ----------------------------------------- 6:09 PM on 12/27/2017 -----------------------------------------  Patient is allowing [place] to withdraw the staples which we will do.  I am worried that if we send her home with him she may not follow-up, and they need to come out.  Is been over a  month.  They do not appear to be infected.  She did initially refused to let us do it but then gave consent.  She did ask for pain ease which we have given her prior to pulling out the staples.  Otherwise, there is no evidence of acute illness or injury here, we will have her closely follow-up with orthopedic surgery, x-ray has been obtained which shows some need for I think continued casting, and I have very strongly advised that she follow closely with Dr. Martha Clan.  Patient voices understanding, again no evidence of withdrawal here no tachycardia no tremulousness,  ----------------------------------------- 7:00 PM on 12/27/2017 -----------------------------------------  Procedure: Staple removal, personally assisted removing staples, there was no complications she tolerated well no evidence of dehiscence or infection.  We clean the arm very well and placed her back with padding into her cast which seems to be the best option for her.  I am worried that a splint we will not make it till her next appointment.  We will let her guardian know that the absolute necessity of those follow-up with orthopedic surgery.   ----------------------------------------- 7:20 PM on 12/27/2017 -----------------------------------------  Guardian made aware.   ____________________________________________   FINAL CLINICAL IMPRESSION(S) / ED DIAGNOSES  Final diagnoses:  None      This chart was dictated using voice recognition software.  Despite best efforts to proofread,  errors can occur which can change meaning.       Jeanmarie Plant, MD 12/27/17 1631    Jeanmarie Plant, MD 12/27/17 1633    Jeanmarie Plant, MD 12/27/17 1810    Jeanmarie Plant, MD 12/27/17 1900    Jeanmarie Plant, MD 12/27/17 Jerene Bears

## 2017-12-27 NOTE — ED Notes (Signed)
First Nurse Note: Pt to ED via POV c/o left arm pain. Pt currently has cast on arm that she says needs to be removed. Pt states that the cast it "too thin to be sawed off". Pt also states that she is withdrawing from ETOH. Pt is in NAD at this time.

## 2017-12-27 NOTE — Discharge Instructions (Addendum)
Keep the cast on call Dr. Samuel Germany office first thing in the morning for an appointment in his office if you have fever, increased pain or other concerns return to the emergency room.

## 2017-12-27 NOTE — ED Notes (Signed)
Rainbow sent to lab by Gerilyn Pilgrim EDT

## 2017-12-27 NOTE — ED Notes (Signed)
Staples removed from pt's left arm.Arm wrapped in gauze and cast replaced back on arm. Pt tolerated well. Pt and friend were educated on the need for the pt to follow up with the orthopedic surgeon. Both verbalized understanding.

## 2018-04-01 ENCOUNTER — Encounter (HOSPITAL_COMMUNITY): Payer: Self-pay | Admitting: Obstetrics and Gynecology

## 2018-04-01 ENCOUNTER — Other Ambulatory Visit: Payer: Self-pay

## 2018-04-01 ENCOUNTER — Emergency Department (HOSPITAL_COMMUNITY): Payer: Medicare Other

## 2018-04-01 ENCOUNTER — Emergency Department (HOSPITAL_COMMUNITY)
Admission: EM | Admit: 2018-04-01 | Discharge: 2018-04-01 | Disposition: A | Discharge status: Home / Self Care | Payer: Medicare Other | Attending: Emergency Medicine | Admitting: Emergency Medicine

## 2018-04-01 DIAGNOSIS — F17210 Nicotine dependence, cigarettes, uncomplicated: Secondary | ICD-10-CM | POA: Insufficient documentation

## 2018-04-01 DIAGNOSIS — Y929 Unspecified place or not applicable: Secondary | ICD-10-CM | POA: Diagnosis not present

## 2018-04-01 DIAGNOSIS — T23012A Burn of unspecified degree of left thumb (nail), initial encounter: Secondary | ICD-10-CM | POA: Diagnosis present

## 2018-04-01 DIAGNOSIS — Y939 Activity, unspecified: Secondary | ICD-10-CM | POA: Insufficient documentation

## 2018-04-01 DIAGNOSIS — Z79899 Other long term (current) drug therapy: Secondary | ICD-10-CM | POA: Insufficient documentation

## 2018-04-01 DIAGNOSIS — Y999 Unspecified external cause status: Secondary | ICD-10-CM | POA: Diagnosis not present

## 2018-04-01 DIAGNOSIS — X58XXXA Exposure to other specified factors, initial encounter: Secondary | ICD-10-CM | POA: Insufficient documentation

## 2018-04-01 MED ORDER — BACITRACIN-NEOMYCIN-POLYMYXIN 400-5-5000 EX OINT
TOPICAL_OINTMENT | Freq: Once | CUTANEOUS | Status: AC
  Administered 2018-04-01: 1 via TOPICAL
  Filled 2018-04-01: qty 3

## 2018-04-01 NOTE — ED Triage Notes (Signed)
Pt has an injury from a reported burn on her left thumb that she said happened approximately 9 days ago. Pt reports the burn is not healing. Noticeable odor to the finger at this time and dead tissue noted.

## 2018-04-01 NOTE — Discharge Instructions (Addendum)
Wash the burn with warm water and soap and use antibiotic ointment on the area. Follow up with your doctor.

## 2018-04-01 NOTE — ED Provider Notes (Signed)
[facility]-EMERGENCY DEPT Provider Note   CSN: [id number] Arrival date & time: 04/01/18  1147     History   Chief Complaint Chief Complaint  Patient presents with  . Finger Injury    HPI Sylvia [name] is a 33 y.o. female who presents to the ED with burn of left thumb that occurred about 9 days ago. Patient c/o burn not healing. No fever.  HPI  Past Medical History:  Diagnosis Date  . Alcoholism (HCC)   . CP (cerebral palsy) (HCC)   . Hepatitis C   . Herpes   . Polysubstance abuse (HCC)   . Schizoaffective schizophrenia (HCC) 09/17/2013   [facility]   . Seizures (HCC)   . Stroke [facility](HCC)     Patient Active Problem List   Diagnosis Date Noted  . Tobacco use disorder 12/07/2017  . Schizoaffective disorder, bipolar type (HCC) 12/05/2017  . SIRS (systemic inflammatory response syndrome) (HCC) 12/02/2017  . Overdose 12/02/2017  . Radius fracture 11/22/2017  . Substance induced mood disorder (HCC) 07/24/2016  . 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 Cocaine use disorder, moderate, dependence (HCC) 03/17/2015  . Cannabis use disorder, mild, abuse 03/17/2015  . Alcohol use disorder, severe, dependence (HCC) 03/17/2015  . Seizure disorder (HCC) 09/17/2013  . Cellulitis and abscess of toe of right foot 09/16/2013  . Hepatitis C   . CP (cerebral palsy) (HCC)     Past Surgical History:  Procedure Laterality Date  . AMPUTATION Right 09/17/2013   Procedure: PARTIAL AMPUTATION 2ND TOE RIGHT FOOT;  Surgeon: Dallas SchimkeBenjamin Ivan McKinney, DPM;  Location: [facility];  Service: Podiatry;  Laterality: Right;  . FOOT SURGERY     rt and left  . LEG SURGERY Right    x7  . OPEN REDUCTION INTERNAL FIXATION (ORIF) DISTAL RADIAL FRACTURE Left 11/22/2017   Procedure: OPEN REDUCTION INTERNAL FIXATION (ORIF) DISTAL RADIAL FRACTURE and carpal tunnel release;  Surgeon: Juanell FairlyKrasinski, Kevin, MD;  Location: [facility] ORS;  Service: Orthopedics;  Laterality: Left;    . ORIF ULNAR FRACTURE Left 12/04/2017   Procedure: OPEN REDUCTION INTERNAL FIXATION (ORIF) ULNAR FRACTURE;  Surgeon: Juanell FairlyKrasinski, Kevin, MD;  Location: [facility] ORS;  Service: Orthopedics;  Laterality: Left;     OB History    Gravida  2   Para  1   Term  1   Preterm      AB  1   Living  1     SAB  1   TAB      Ectopic      Multiple      Live Births               Home Medications    Prior to Admission medications   Medication Sig Start Date End Date Taking? Authorizing Provider  ARIPiprazole (ABILIFY) 20 MG tablet Take 1 tablet (20 mg total) by mouth daily. 12/10/17   Pucilowska, Braulio ConteJolanta B, MD  cholecalciferol (VITAMIN D) 1000 units tablet Take 1 tablet (1,000 Units total) by mouth daily. 12/10/17   Pucilowska, Braulio ConteJolanta B, MD  docusate sodium (COLACE) 100 MG capsule Take 1 capsule (100 mg total) by mouth 2 (two) times daily. 12/10/17   Pucilowska, Jolanta B, MD  fluvoxaMINE (LUVOX) 100 MG tablet Take 1 tablet (100 mg total) by mouth at bedtime. 12/10/17   Pucilowska, Braulio ConteJolanta B, MD  gabapentin (NEURONTIN) 300 MG capsule Take 2 capsules (600 mg total) by mouth 2 (two) times daily. 12/10/17   Pucilowska,  Jolanta B, MD  hydrOXYzine (ATARAX/VISTARIL) 50 MG tablet Take 1 tablet (50 mg total) by mouth 3 (three) times daily. 12/10/17   Pucilowska, Braulio Conte B, MD  levETIRAcetam (KEPPRA) 250 MG tablet Take 1 tablet (250 mg total) by mouth at bedtime. 12/10/17   Pucilowska, Braulio Conte B, MD  traZODone (DESYREL) 50 MG tablet Take 1 tablet (50 mg total) by mouth at bedtime. 12/10/17   Shari Prows, MD    Family History Family History  Problem Relation Age of Onset  . Bipolar disorder Mother   . Cancer Maternal Grandmother   . Diabetes Maternal Grandfather   . Hypertension Maternal Grandfather     Social History Social History   Tobacco Use  . Smoking status: Current Every Day Smoker    Packs/day: 1.00    Years: 20.00    Pack years: 20.00    Types: Cigarettes  .  Smokeless tobacco: Never Used  Substance Use Topics  . Alcohol use: Yes    Alcohol/week: 3.0 standard drinks    Types: 3 Cans of beer per week    Comment: Per patient 0.5 gallon to gallon when she can but since being in group home she can't do it as often   . Drug use: Yes    Types: "Crack" cocaine, Marijuana, Cocaine    Comment: Per patient uses herion and crack and cocaine "once in a blue moon"     Allergies   Diphenhydramine hcl; Fluoxetine; Baclofen; Depakote [divalproex sodium]; and Quetiapine   Review of Systems Review of Systems  Musculoskeletal: Positive for arthralgias.  Skin: Positive for wound.  All other systems reviewed and are negative.    Physical Exam Updated Vital Signs BP 129/86 (BP Location: Right Arm)   Pulse 96   Temp 98.4 F (36.9 C) (Oral)   Resp 15   SpO2 99%   Physical Exam Vitals signs and nursing note reviewed.  Constitutional:      General: She is not in acute distress.    Appearance: She is well-developed.  HENT:     Head: Normocephalic.     Mouth/Throat:     Mouth: Mucous membranes are moist.  Eyes:     Conjunctiva/sclera: Conjunctivae normal.  Neck:     Musculoskeletal: Neck supple.  Cardiovascular:     Rate and Rhythm: Normal rate.  Pulmonary:     Effort: Pulmonary effort is normal.  Musculoskeletal:     Comments: Left thumb with healing burn.   Skin:    General: Skin is warm and dry.  Neurological:     Mental Status: She is alert.      ED Treatments / Results  Labs (all labs ordered are listed, but only abnormal results are displayed) Labs Reviewed - No data to display  Radiology Dg Finger Thumb Left  Result Date: 04/01/2018 CLINICAL DATA:  Nonhealing burn EXAM: LEFT THUMB 2+V COMPARISON:  None. FINDINGS: Frontal, oblique, and lateral views were obtained. There is evidence of soft tissue injury along the lateral aspect of the first digit distally. There is no associated soft tissue air or radiopaque foreign body. No  fracture or dislocation. Joint spaces appear normal. No erosive change or bony destruction. IMPRESSION: No bony destruction or erosion. No arthropathy. No fracture or dislocation. Soft tissue injury to the distal first digit laterally. No soft tissue air or radiopaque foreign body evident. Electronically Signed   By: Bretta Bang III M.D.   On: 04/01/2018 13:38    Procedures: wound cleaned with normal saline. Neosporin  ointment,  telfa dressing applied.  Procedures (including critical care time)  Medications Ordered in ED Medications  neomycin-bacitracin-polymyxin (NEOSPORIN) ointment packet (1 application Topical Given 04/01/18 1412)   Dr. Effie ShyWentz in to see the patient. X-ray without osteo. Will treat with topical antibiotics and d/c home. Discussed with the patient wound care and return precautions.   Initial Impression / Assessment and Plan / ED Course  I have reviewed the triage vital signs and the nursing notes.  Final Clinical Impressions(s) / ED Diagnoses   Final diagnoses:  Burn of left thumb, unspecified burn degree, initial encounter    ED Discharge Orders    None       Kerrie Buffaloeese, Hope FestusM, [place]NP 04/01/18 2239    Mancel BaleWentz, Elliott, MD 04/03/18 [phone]

## 2018-04-01 NOTE — ED Notes (Signed)
Patient asking RN about "How to get a DNR" RN explained to patient that it is highly unusual to want a DNR at her age and pt stated "Well I have seizures and cerebral palsy and have had two strokes."

## 2018-04-01 NOTE — ED Notes (Signed)
Bed: WTR5 Expected date:  Expected time:  Means of arrival:  Comments: 

## 2018-08-13 ENCOUNTER — Other Ambulatory Visit: Payer: Self-pay

## 2018-08-13 ENCOUNTER — Emergency Department (HOSPITAL_COMMUNITY)
Admission: EM | Admit: 2018-08-13 | Discharge: 2018-08-13 | Disposition: A | Discharge status: Home / Self Care | Payer: Medicare Other | Attending: Emergency Medicine | Admitting: Emergency Medicine

## 2018-08-13 ENCOUNTER — Encounter (HOSPITAL_COMMUNITY): Payer: Self-pay

## 2018-08-13 DIAGNOSIS — F17210 Nicotine dependence, cigarettes, uncomplicated: Secondary | ICD-10-CM | POA: Diagnosis not present

## 2018-08-13 DIAGNOSIS — Z79899 Other long term (current) drug therapy: Secondary | ICD-10-CM | POA: Diagnosis not present

## 2018-08-13 DIAGNOSIS — H9201 Otalgia, right ear: Secondary | ICD-10-CM | POA: Diagnosis present

## 2018-08-13 DIAGNOSIS — H6691 Otitis media, unspecified, right ear: Secondary | ICD-10-CM | POA: Diagnosis not present

## 2018-08-13 DIAGNOSIS — H6690 Otitis media, unspecified, unspecified ear: Secondary | ICD-10-CM

## 2018-08-13 MED ORDER — FLUTICASONE PROPIONATE 50 MCG/ACT NA SUSP: 2.0000 | Freq: Every day | NASAL | 0 refills | Status: AC

## 2018-08-13 MED ORDER — AMOXICILLIN-POT CLAVULANATE 875-125 MG PO TABS: 1.0000 | ORAL_TABLET | Freq: Two times a day (BID) | ORAL | 0 refills | Status: DC

## 2018-08-13 MED ORDER — CETIRIZINE HCL 10 MG PO TABS: 10.0000 mg | ORAL_TABLET | Freq: Every day | ORAL | 0 refills | Status: AC

## 2018-08-13 NOTE — ED Provider Notes (Signed)
[facility]-EMERGENCY DEPT Provider Note   CSN: [id number] Arrival date & time: 08/13/18  1440    History   Chief Complaint Chief Complaint  Patient presents with  . Otalgia    HPI Sylvia [name] is a 33 y.o. female.     Sylvia [name] is a 33 y.o. female with a history of hepatitis C, seizures, CP, schizoaffective disorder and polysubstance abuse, who presents to the emergency department for evaluation of right ear pain.  She reports for the past 3 to 5 days her ear has been painful, she reports it feels stuffed up like there is water stuck in her ear and she has had muffled hearing for the past 3 days.  She reports the area seems to be occasionally tender to the touch.  She denies fevers or chills.  Does report associated nasal congestion, no referral cough.  No headache or vision changes.  She has not taken anything to treat her symptoms prior to arrival, no other aggravating or alleviating factors.  Denies history of recurrent ear infection.     Past Medical History:  Diagnosis Date  . Alcoholism (HCC)   . CP (cerebral palsy) (HCC)   . Hepatitis C   . Herpes   . Polysubstance abuse (HCC)   . Schizoaffective schizophrenia (HCC) 09/17/2013   [facility]   . Seizures (HCC)   . Stroke [facility](HCC)     Patient Active Problem List   Diagnosis Date Noted  . Tobacco use disorder 12/07/2017  . Schizoaffective disorder, bipolar type (HCC) 12/05/2017  . SIRS (systemic inflammatory response syndrome) (HCC) 12/02/2017  . Overdose 12/02/2017  . Radius fracture 11/22/2017  . Substance induced mood disorder (HCC) 07/24/2016  . 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 Cocaine use disorder, moderate, dependence (HCC) 03/17/2015  . Cannabis use disorder, mild, abuse 03/17/2015  . Alcohol use disorder, severe, dependence (HCC) 03/17/2015  . Seizure disorder (HCC) 09/17/2013  . Cellulitis and abscess of toe of right foot 09/16/2013  . Hepatitis  C   . CP (cerebral palsy) (HCC)     Past Surgical History:  Procedure Laterality Date  . AMPUTATION Right 09/17/2013   Procedure: PARTIAL AMPUTATION 2ND TOE RIGHT FOOT;  Surgeon: Sylvia [name], DPM;  Location: [facility];  Service: Podiatry;  Laterality: Right;  . FOOT SURGERY     rt and left  . LEG SURGERY Right    x7  . OPEN REDUCTION INTERNAL FIXATION (ORIF) DISTAL RADIAL FRACTURE Left 11/22/2017   Procedure: OPEN REDUCTION INTERNAL FIXATION (ORIF) DISTAL RADIAL FRACTURE and carpal tunnel release;  Surgeon: Sylvia [name];  Location: [facility] ORS;  Service: Orthopedics;  Laterality: Left;  . ORIF ULNAR FRACTURE Left 12/04/2017   Procedure: OPEN REDUCTION INTERNAL FIXATION (ORIF) ULNAR FRACTURE;  Surgeon: Sylvia [name];  Location: [facility] ORS;  Service: Orthopedics;  Laterality: Left;     OB History    Gravida  2   Para  1   Term  1   Preterm      AB  1   Living  1     SAB  1   TAB      Ectopic      Multiple      Live Births               Home Medications    Prior to Admission medications   Medication Sig Start Date End Date Taking? Authorizing Provider  amoxicillin-clavulanate (AUGMENTIN) [phone]  MG tablet Take 1 tablet by mouth 2 (two) times daily. One po bid x 7 days 08/13/18   Dartha LodgeFord, Jahki Witham N, PA-C  ARIPiprazole (ABILIFY) 20 MG tablet Take 1 tablet (20 mg total) by mouth daily. 12/10/17   Pucilowska, Ellin GoodieJolanta B, [name]  cetirizine (ZYRTEC ALLERGY) 10 MG tablet Take 1 tablet (10 mg total) by mouth daily. 08/13/18   Dartha LodgeFord, Clorinda Wyble N, PA-C  cholecalciferol (VITAMIN D) 1000 units tablet Take 1 tablet (1,000 Units total) by mouth daily. 12/10/17   Pucilowska, Braulio ConteJolanta B, [name]  docusate sodium (COLACE) 100 MG capsule Take 1 capsule (100 mg total) by mouth 2 (two) times daily. 12/10/17   Pucilowska, Jolanta B, [name]  fluticasone (FLONASE) 50 MCG/ACT nasal spray Place 2 sprays into both nostrils daily. 08/13/18   Dartha LodgeFord, Linken Mcglothen N, PA-C  fluvoxaMINE (LUVOX) 100 MG tablet  Take 1 tablet (100 mg total) by mouth at bedtime. 12/10/17   Pucilowska, Braulio ConteJolanta B, [name]  gabapentin (NEURONTIN) 300 MG capsule Take 2 capsules (600 mg total) by mouth 2 (two) times daily. 12/10/17   Pucilowska, Braulio ConteJolanta B, [name]  hydrOXYzine (ATARAX/VISTARIL) 50 MG tablet Take 1 tablet (50 mg total) by mouth 3 (three) times daily. 12/10/17   Pucilowska, Braulio ConteJolanta B, [name]  levETIRAcetam (KEPPRA) 250 MG tablet Take 1 tablet (250 mg total) by mouth at bedtime. 12/10/17   Pucilowska, Braulio ConteJolanta B, [name]  traZODone (DESYREL) 50 MG tablet Take 1 tablet (50 mg total) by mouth at bedtime. 12/10/17   Shari ProwsPucilowska, Jolanta B, [name]    Family History Family History  Problem Relation Age of Onset  . Bipolar disorder Mother   . Cancer Maternal Grandmother   . Diabetes Maternal Grandfather   . Hypertension Maternal Grandfather     Social History Social History   Tobacco Use  . Smoking status: Current Every Day Smoker    Packs/day: 1.00    Years: 20.00    Pack years: 20.00    Types: Cigarettes  . Smokeless tobacco: Never Used  Substance Use Topics  . Alcohol use: Yes    Alcohol/week: 3.0 standard drinks    Types: 3 Cans of beer per week    Comment: Per patient 0.5 gallon to gallon when she can but since being in group home she can't do it as often   . Drug use: Yes    Types: "Crack" cocaine, Marijuana, Cocaine    Comment: Per patient uses herion and crack and cocaine "once in a blue moon"     Allergies   Diphenhydramine hcl, Fluoxetine, Baclofen, Depakote [divalproex sodium], and Quetiapine   Review of Systems Review of Systems  Constitutional: Negative for chills and fever.  HENT: Positive for congestion, ear pain, hearing loss and rhinorrhea. Negative for ear discharge and sore throat.   Respiratory: Negative for cough and shortness of breath.   Cardiovascular: Negative for chest pain.  Gastrointestinal: Negative for nausea and vomiting.  Skin: Negative for color change and rash.  Neurological:  Negative for headaches.     Physical Exam Updated Vital Signs BP 116/86 (BP Location: Right Arm)   Pulse 98   Temp 98.4 F (36.9 C) (Oral)   Resp 20   Ht 5\' 4"  (1.626 m)   Wt 68 kg   SpO2 100%   BMI 25.75 kg/m   Physical Exam Vitals signs and nursing note reviewed.  Constitutional:      General: She is not in acute distress.    Appearance: Normal appearance. She is well-developed and normal weight. She  is not ill-appearing or diaphoretic.  HENT:     Head: Normocephalic and atraumatic.     Comments: Right TM dull and erythematous with middle ear effusion noted, external auditory canal is clear with no erythema, edema or exudates.  No mastoid tenderness or pre-or postauricular lymphadenopathy. Left TM unremarkable and ear canal clear    Nose: Congestion and rhinorrhea present.     Mouth/Throat:     Mouth: Mucous membranes are moist.     Pharynx: Oropharynx is clear.     Comments: Posterior oropharynx clear and mucous membranes moist, there is mild erythema but no edema or tonsillar exudates, uvula midline, normal phonation, no trismus, tolerating secretions without difficulty. Eyes:     General:        Right eye: No discharge.        Left eye: No discharge.  Neck:     Musculoskeletal: Neck supple.     Comments: No rigidity Cardiovascular:     Rate and Rhythm: Normal rate and regular rhythm.     Heart sounds: Normal heart sounds. No murmur. No friction rub. No gallop.   Pulmonary:     Effort: Pulmonary effort is normal. No respiratory distress.     Breath sounds: Normal breath sounds.     Comments: Respirations equal and unlabored, patient able to speak in full sentences, lungs clear to auscultation bilaterally Abdominal:     General: Bowel sounds are normal. There is no distension.     Palpations: Abdomen is soft. There is no mass.     Tenderness: There is no abdominal tenderness. There is no guarding.     Comments: Abdomen soft, nondistended, nontender to palpation in  all quadrants without guarding or peritoneal signs  Musculoskeletal:        General: No deformity.  Lymphadenopathy:     Cervical: No cervical adenopathy.  Skin:    General: Skin is warm and dry.     Capillary Refill: Capillary refill takes less than 2 seconds.  Neurological:     Mental Status: She is alert and oriented to person, place, and time.  Psychiatric:        Mood and Affect: Mood normal.        Behavior: Behavior normal.      ED Treatments / Results  Labs (all labs ordered are listed, but only abnormal results are displayed) Labs Reviewed - No data to display  EKG None  Radiology No results found.  Procedures Procedures (including critical care time)  Medications Ordered in ED Medications - No data to display   Initial Impression / Assessment and Plan / ED Course  I have reviewed the triage vital signs and the nursing notes.  Pertinent labs & imaging results that were available during my care of the patient were reviewed by me and considered in my medical decision making (see chart for details).  Patient presents with otalgia and exam consistent with acute otitis media. No concern for acute mastoiditis, meningitis.  Patient discharged home with Augmentin.  I have also prescribed Zyrtec and Flonase to help with nasal congestion which should help with your effusion.  Advised patient to schedule PCP follow-up.  I have also discussed reasons to return immediately to the ER.  Parent expresses understanding and agrees with plan.   Final Clinical Impressions(s) / ED Diagnoses   Final diagnoses:  Acute otitis media, unspecified otitis media type    ED Discharge Orders         Ordered  amoxicillin-clavulanate (AUGMENTIN) 875-125 MG tablet  2 times daily     08/13/18 1750    fluticasone (FLONASE) 50 MCG/ACT nasal spray  Daily     08/13/18 1750    cetirizine (ZYRTEC ALLERGY) 10 MG tablet  Daily     08/13/18 1750           Dartha LodgeFord, Shakur Lembo N, [place]PA-C 08/15/18  1318    Vanetta MuldersZackowski, Scott, [name] 08/16/18 1708

## 2018-08-13 NOTE — ED Triage Notes (Signed)
Pt reports that she has water in her ear and has had hearing loss to rt ear x 3-5 days. Area is tender at times to touch.

## 2018-08-13 NOTE — Discharge Instructions (Signed)
You have an inner ear infection, please take antibiotics as provided.  Please take Zyrtec and Flonase daily to help reduce nasal and ear congestion.  Follow-up with your regular doctor if symptoms are not improving.

## 2018-10-01 ENCOUNTER — Encounter: Payer: Self-pay | Admitting: Emergency Medicine

## 2018-10-01 ENCOUNTER — Ambulatory Visit
Admission: EM | Admit: 2018-10-01 | Discharge: 2018-10-01 | Disposition: A | Discharge status: Other Acute Inpatient Hospital | Payer: Medicare Other | Attending: Family Medicine | Admitting: Family Medicine

## 2018-10-01 ENCOUNTER — Other Ambulatory Visit: Payer: Self-pay

## 2018-10-01 DIAGNOSIS — L0291 Cutaneous abscess, unspecified: Secondary | ICD-10-CM

## 2018-10-01 DIAGNOSIS — F19929 Other psychoactive substance use, unspecified with intoxication, unspecified: Secondary | ICD-10-CM | POA: Diagnosis not present

## 2018-10-01 DIAGNOSIS — F1910 Other psychoactive substance abuse, uncomplicated: Secondary | ICD-10-CM | POA: Diagnosis not present

## 2018-10-01 NOTE — ED Provider Notes (Signed)
[facility]    CSN: [id number] Arrival date & time: 10/01/18  1600  History   Chief Complaint Chief Complaint  Patient presents with  . Abscess   HPI  33 year old female presents with abscess.  Patient is a poor historian.  She is currently intoxicated.  She has more lucid intervals where she answers questions fairly appropriately and seems to get quite tangential and is difficult to follow.    She states that she has abscess to her right antecubital area for the past week.  Started after she did intravenous drugs.  She has another small area on her right hand.  Denies fevers.  When asked, patient states that she has drink today as well as used methamphetamine.  PMH, Surgical Hx, Family Hx, Social History reviewed and updated as below.  Past Medical History:  Diagnosis Date  . Alcoholism (HCC)   . CP (cerebral palsy) (HCC)   . Hepatitis C   . Herpes   . Polysubstance abuse (HCC)   . Schizoaffective schizophrenia (HCC) 09/17/2013   [facility]   . Seizures (HCC)   . Stroke [facility](HCC)     Patient Active Problem List   Diagnosis Date Noted  . Tobacco use disorder 12/07/2017  . Schizoaffective disorder, bipolar type (HCC) 12/05/2017  . SIRS (systemic inflammatory response syndrome) (HCC) 12/02/2017  . Overdose 12/02/2017  . Radius fracture 11/22/2017  . Substance induced mood disorder (HCC) 07/24/2016  . 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 Cocaine use disorder, moderate, dependence (HCC) 03/17/2015  . Cannabis use disorder, mild, abuse 03/17/2015  . Alcohol use disorder, severe, dependence (HCC) 03/17/2015  . Seizure disorder (HCC) 09/17/2013  . Cellulitis and abscess of toe of right foot 09/16/2013  . Hepatitis C   . CP (cerebral palsy) (HCC)     Past Surgical History:  Procedure Laterality Date  . AMPUTATION Right 09/17/2013   Procedure: PARTIAL AMPUTATION 2ND TOE RIGHT FOOT;  Surgeon: Dallas SchimkeBenjamin Ivan McKinney, DPM;  Location:  [facility];  Service: Podiatry;  Laterality: Right;  . FOOT SURGERY     rt and left  . LEG SURGERY Right    x7  . OPEN REDUCTION INTERNAL FIXATION (ORIF) DISTAL RADIAL FRACTURE Left 11/22/2017   Procedure: OPEN REDUCTION INTERNAL FIXATION (ORIF) DISTAL RADIAL FRACTURE and carpal tunnel release;  Surgeon: Juanell FairlyKrasinski, Kevin, MD;  Location: [facility] ORS;  Service: Orthopedics;  Laterality: Left;  . ORIF ULNAR FRACTURE Left 12/04/2017   Procedure: OPEN REDUCTION INTERNAL FIXATION (ORIF) ULNAR FRACTURE;  Surgeon: Juanell FairlyKrasinski, Kevin, MD;  Location: [facility] ORS;  Service: Orthopedics;  Laterality: Left;    OB History    Gravida  2   Para  1   Term  1   Preterm      AB  1   Living  1     SAB  1   TAB      Ectopic      Multiple      Live Births               Home Medications    Prior to Admission medications   Medication Sig Start Date End Date Taking? Authorizing Provider  ARIPiprazole (ABILIFY) 20 MG tablet Take 1 tablet (20 mg total) by mouth daily. 12/10/17  Yes Pucilowska, Jolanta B, MD  cholecalciferol (VITAMIN D) 1000 units tablet Take 1 tablet (1,000 Units total) by mouth daily. 12/10/17  Yes Pucilowska, Jolanta B, MD  fluvoxaMINE (LUVOX) 100 MG tablet Take 1  tablet (100 mg total) by mouth at bedtime. 12/10/17  Yes Pucilowska, Jolanta B, MD  gabapentin (NEURONTIN) 300 MG capsule Take 2 capsules (600 mg total) by mouth 2 (two) times daily. 12/10/17  Yes Pucilowska, Jolanta B, MD  hydrOXYzine (ATARAX/VISTARIL) 50 MG tablet Take 1 tablet (50 mg total) by mouth 3 (three) times daily. 12/10/17  Yes Pucilowska, Jolanta B, MD  levETIRAcetam (KEPPRA) 250 MG tablet Take 1 tablet (250 mg total) by mouth at bedtime. 12/10/17  Yes Pucilowska, Jolanta B, MD  traZODone (DESYREL) 50 MG tablet Take 1 tablet (50 mg total) by mouth at bedtime. 12/10/17  Yes Pucilowska, Jolanta B, MD  amoxicillin-clavulanate (AUGMENTIN) 875-125 MG tablet Take 1 tablet by mouth 2 (two) times daily. One po bid x 7  days 08/13/18   Dartha LodgeFord, Kelsey N, PA-C  cetirizine (ZYRTEC ALLERGY) 10 MG tablet Take 1 tablet (10 mg total) by mouth daily. 08/13/18   Dartha LodgeFord, Kelsey N, PA-C  docusate sodium (COLACE) 100 MG capsule Take 1 capsule (100 mg total) by mouth 2 (two) times daily. 12/10/17   Pucilowska, Jolanta B, MD  fluticasone (FLONASE) 50 MCG/ACT nasal spray Place 2 sprays into both nostrils daily. 08/13/18   Dartha LodgeFord, Kelsey N, PA-C    Family History Family History  Problem Relation Age of Onset  . Bipolar disorder Mother   . Cancer Maternal Grandmother   . Diabetes Maternal Grandfather   . Hypertension Maternal Grandfather     Social History Social History   Tobacco Use  . Smoking status: Current Every Day Smoker    Packs/day: 1.00    Years: 20.00    Pack years: 20.00    Types: Cigarettes  . Smokeless tobacco: Never Used  Substance Use Topics  . Alcohol use: Yes    Alcohol/week: 3.0 standard drinks    Types: 3 Cans of beer per week    Comment: Per patient 0.5 gallon to gallon when she can but since being in group home she can't do it as often   . Drug use: Yes    Types: "Crack" cocaine, Marijuana, Cocaine    Comment: Per patient uses herion and crack and cocaine "once in a blue moon"     Allergies   Diphenhydramine hcl, Fluoxetine, Baclofen, Depakote [divalproex sodium], and Quetiapine   Review of Systems Review of Systems Per HPI  Physical Exam Triage Vital Signs ED Triage Vitals  Enc Vitals Group     BP 10/01/18 1617 (!) 156/113     Pulse Rate 10/01/18 1617 (!) 128     Resp 10/01/18 1617 20     Temp 10/01/18 1617 98.7 F (37.1 C)     Temp Source 10/01/18 1617 Oral     SpO2 10/01/18 1617 99 %     Weight 10/01/18 1612 145 lb (65.8 kg)     Height 10/01/18 1612 5\' 4"  (1.626 m)     Head Circumference --      Peak Flow --      Pain Score 10/01/18 1612 1     Pain Loc --      Pain Edu? --      Excl. in GC? --    Updated Vital Signs BP (!) 156/113 (BP Location: Left Arm)   Pulse (!)  128   Temp 98.7 F (37.1 C) (Oral)   Resp 20   Ht 5\' 4"  (1.626 m)   Wt 65.8 kg   SpO2 99%   BMI 24.89 kg/m   Visual Acuity Right Eye Distance:  Left Eye Distance:   Bilateral Distance:    Right Eye Near:   Left Eye Near:    Bilateral Near:     Physical Exam Vitals signs and nursing note reviewed.  Constitutional:      Comments: Is not ill-appearing.  Appears to be intoxicated.  HENT:     Head: Normocephalic and atraumatic.  Eyes:     General:        Right eye: No discharge.        Left eye: No discharge.     Conjunctiva/sclera: Conjunctivae normal.  Cardiovascular:     Rate and Rhythm: Regular rhythm. Tachycardia present.  Pulmonary:     Effort: Pulmonary effort is normal.     Breath sounds: Normal breath sounds.  Skin:    Comments: Right antecubital fossa with a large abscess.  No current drainage.  Induration and erythema noted.  Neurological:     Mental Status: She is alert.  Psychiatric:     Comments: Difficult to follow.  Tangential.      UC Treatments / Results  Labs (all labs ordered are listed, but only abnormal results are displayed) Labs Reviewed - No data to display  EKG   Radiology No results found.  Procedures Incision and Drainage  Date/Time: 10/01/2018 5:24 PM Performed by: Coral Spikes, DO Authorized by: Coral Spikes, DO   Consent:    Consent obtained: Urgent situation. Patient initially more lucid and gave verbal consent. Location:    Type:  Abscess   Location:  Upper extremity   Upper extremity location:  Arm   Arm location:  R upper arm Pre-procedure details:    Skin preparation:  Betadine Anesthesia (see MAR for exact dosages):    Anesthesia method:  Local infiltration   Local anesthetic:  Lidocaine 1% w/o epi Procedure type:    Complexity:  Simple Procedure details:    Incision types:  Stab incision   Scalpel blade:  11   Wound management:  Probed and deloculated   Drainage:  Purulent and bloody   Drainage amount:   Moderate   Wound treatment:  Wound left open   Packing materials:  None Post-procedure details:    Patient tolerance of procedure:  Tolerated well, no immediate complications   (including critical care time)  Medications Ordered in UC Medications - No data to display  Initial Impression / Assessment and Plan / UC Course  I have reviewed the triage vital signs and the nursing notes.  Pertinent labs & imaging results that were available during my care of the patient were reviewed by me and considered in my medical decision making (see chart for details).    33 year old female presents with abscess.  Initially seemed more lucid and requested that her abscess be drained and she be discharged home.  Throughout her visit, was agitated at times and very tangential.  Patient appears to be intoxicated and possibly experiencing withdrawal symptoms. Difficult to discern given her history of mental illness as well as polysubstance abuse.  I am concerned for her safety.  I have spoken with her sister and both the patient and the sister are amenable to her going to the hospital.  Sister will be transporting her to [facility].  Incision and drainage was performed of her abscess.  Final Clinical Impressions(s) / UC Diagnoses   Final diagnoses:  Polysubstance abuse ([place])  Drug intoxication with complication ([place])  Abscess     Discharge Instructions     Please take her  straight to the ER.  Take care  Dr. Adriana Simasook    ED Prescriptions    None     Controlled Substance Prescriptions [place] Controlled Substance Registry consulted? Not Applicable   Tommie SamsCook, Taryne Kiger G, DO 10/01/18 1729

## 2018-10-01 NOTE — Discharge Instructions (Signed)
Please take her straight to the ER.  Take care  Dr. Lacinda Axon

## 2018-10-01 NOTE — ED Triage Notes (Signed)
Pt c/o abscess on her right hand. Started about 2 weeks ago. She has been getting discharge from the abscess. Pt states that she has been drinking today and is trying to quit. Pt states that she had a 40 oz beer and 2 natural lights and another type of malt beverage today but this is less than she normally drinks.

## 2018-11-16 ENCOUNTER — Emergency Department (HOSPITAL_COMMUNITY)
Admission: EM | Admit: 2018-11-16 | Discharge: 2018-11-16 | Disposition: A | Discharge status: Home / Self Care | Payer: Medicare Other | Attending: Emergency Medicine | Admitting: Emergency Medicine

## 2018-11-16 ENCOUNTER — Encounter (HOSPITAL_COMMUNITY): Payer: Self-pay

## 2018-11-16 DIAGNOSIS — F17210 Nicotine dependence, cigarettes, uncomplicated: Secondary | ICD-10-CM | POA: Insufficient documentation

## 2018-11-16 DIAGNOSIS — Y908 Blood alcohol level of 240 mg/100 ml or more: Secondary | ICD-10-CM | POA: Diagnosis not present

## 2018-11-16 DIAGNOSIS — Z79899 Other long term (current) drug therapy: Secondary | ICD-10-CM | POA: Diagnosis not present

## 2018-11-16 DIAGNOSIS — F10920 Alcohol use, unspecified with intoxication, uncomplicated: Secondary | ICD-10-CM

## 2018-11-16 DIAGNOSIS — F10229 Alcohol dependence with intoxication, unspecified: Secondary | ICD-10-CM | POA: Insufficient documentation

## 2018-11-16 LAB — CBC
HCT: 42.6 % (ref 36.0–46.0)
Hemoglobin: 14.1 g/dL (ref 12.0–15.0)
MCH: 33.7 pg (ref 26.0–34.0)
MCHC: 33.1 g/dL (ref 30.0–36.0)
MCV: 101.7 fL — ABNORMAL HIGH (ref 80.0–100.0)
Platelets: 207 10*3/uL (ref 150–400)
RBC: 4.19 MIL/uL (ref 3.87–5.11)
RDW: 12.1 % (ref 11.5–15.5)
WBC: 4.5 10*3/uL (ref 4.0–10.5)
nRBC: 0 % (ref 0.0–0.2)

## 2018-11-16 LAB — COMPREHENSIVE METABOLIC PANEL
ALT: 28 U/L (ref 0–44)
AST: 33 U/L (ref 15–41)
Albumin: 3.9 g/dL (ref 3.5–5.0)
Alkaline Phosphatase: 63 U/L (ref 38–126)
Anion gap: 16 — ABNORMAL HIGH (ref 5–15)
BUN: 6 mg/dL (ref 6–20)
CO2: 16 mmol/L — ABNORMAL LOW (ref 22–32)
Calcium: 8.6 mg/dL — ABNORMAL LOW (ref 8.9–10.3)
Chloride: 103 mmol/L (ref 98–111)
Creatinine, Ser: 0.63 mg/dL (ref 0.44–1.00)
GFR calc Af Amer: >60 mL/min (ref >60)
GFR calc non Af Amer: >60 mL/min (ref >60)
Glucose, Bld: 99 mg/dL (ref 70–99)
Potassium: 3.8 mmol/L (ref 3.5–5.1)
Sodium: 135 mmol/L (ref 135–145)
Total Bilirubin: 0.7 mg/dL (ref 0.3–1.2)
Total Protein: 7.4 g/dL (ref 6.5–8.1)

## 2018-11-16 LAB — I-STAT BETA HCG BLOOD, ED (MC, WL, AP ONLY): I-stat hCG, quantitative: <5 m[IU]/mL (ref <5)

## 2018-11-16 LAB — ETHANOL: Alcohol, Ethyl (B): 266 mg/dL — ABNORMAL HIGH (ref <10)

## 2018-11-16 MED ORDER — SODIUM CHLORIDE 0.9 % IV BOLUS: 1000.0000 mL | Freq: Once | INTRAVENOUS | Status: DC

## 2018-11-16 NOTE — Discharge Instructions (Addendum)
Please return to ER if you develop difficulty breathing, chest pain, vomiting, shaking, seizures, abdominal pain or other new concerning symptom.  Recommend alcohol cessation.

## 2018-11-16 NOTE — ED Provider Notes (Signed)
[facility]-EMERGENCY DEPT Provider Note   CSN: [id number] Arrival date & time: 11/16/18  1519     History   Chief Complaint Chief Complaint  Patient presents with  . Alcohol Intoxication    HPI Sylvia [name] is a 33 y.o. female.  Presents emergency department with concern for alcohol intoxication.  Patient came from home with concern for intoxication.  Reported vomiting, incontinence per EMS.  Patient endorsed drinking a lot of beer today.  At time of my interview she denies any acute complaints and refuses to answer any further questions.  Level 5 history caveat history limited due to alcohol intoxication.     HPI  Past Medical History:  Diagnosis Date  . Alcoholism (HCC)   . CP (cerebral palsy) (HCC)   . Hepatitis C   . Herpes   . Polysubstance abuse (HCC)   . Schizoaffective schizophrenia (HCC) 09/17/2013   [facility]   . Seizures (HCC)   . Stroke [facility](HCC)     Patient Active Problem List   Diagnosis Date Noted  . Tobacco use disorder 12/07/2017  . Schizoaffective disorder, bipolar type (HCC) 12/05/2017  . SIRS (systemic inflammatory response syndrome) (HCC) 12/02/2017  . Overdose 12/02/2017  . Radius fracture 11/22/2017  . Substance induced mood disorder (HCC) 07/24/2016  . 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 Cocaine use disorder, moderate, dependence (HCC) 03/17/2015  . Cannabis use disorder, mild, abuse 03/17/2015  . Alcohol use disorder, severe, dependence (HCC) 03/17/2015  . Seizure disorder (HCC) 09/17/2013  . Cellulitis and abscess of toe of right foot 09/16/2013  . Hepatitis C   . CP (cerebral palsy) (HCC)     Past Surgical History:  Procedure Laterality Date  . AMPUTATION Right 09/17/2013   Procedure: PARTIAL AMPUTATION 2ND TOE RIGHT FOOT;  Surgeon: Dallas SchimkeBenjamin Ivan McKinney, DPM;  Location: [facility];  Service: Podiatry;  Laterality: Right;  . FOOT SURGERY     rt and left  . LEG SURGERY Right    x7   . OPEN REDUCTION INTERNAL FIXATION (ORIF) DISTAL RADIAL FRACTURE Left 11/22/2017   Procedure: OPEN REDUCTION INTERNAL FIXATION (ORIF) DISTAL RADIAL FRACTURE and carpal tunnel release;  Surgeon: Juanell FairlyKrasinski, Kevin, MD;  Location: [facility] ORS;  Service: Orthopedics;  Laterality: Left;  . ORIF ULNAR FRACTURE Left 12/04/2017   Procedure: OPEN REDUCTION INTERNAL FIXATION (ORIF) ULNAR FRACTURE;  Surgeon: Juanell FairlyKrasinski, Kevin, MD;  Location: [facility] OR[facility];  Service: Orthopedics;  Laterality: Left;     OB History    Gravida  2   Para  1   Term  1   Preterm      AB  1   Living  1     SAB  1   TAB      Ectopic      Multiple      Live Births               Home Medications    Prior to Admission medications   Medication Sig Start Date End Date Taking? Authorizing Provider  amoxicillin-clavulanate (AUGMENTIN) 875-125 MG tablet Take 1 tablet by mouth 2 (two) times daily. One po bid x 7 days 08/13/18   Dartha LodgeFord, Kelsey N, PA-C  ARIPiprazole (ABILIFY) 20 MG tablet Take 1 tablet (20 mg total) by mouth daily. 12/10/17   Pucilowska, Ellin GoodieJolanta B, MD  cetirizine (ZYRTEC ALLERGY) 10 MG tablet Take 1 tablet (10 mg total) by mouth daily. 08/13/18   Dartha LodgeFord, Kelsey N, PA-C  cholecalciferol (  VITAMIN D) 1000 units tablet Take 1 tablet (1,000 Units total) by mouth daily. 12/10/17   Pucilowska, Braulio Conte B, MD  docusate sodium (COLACE) 100 MG capsule Take 1 capsule (100 mg total) by mouth 2 (two) times daily. 12/10/17   Pucilowska, Jolanta B, MD  fluticasone (FLONASE) 50 MCG/ACT nasal spray Place 2 sprays into both nostrils daily. 08/13/18   Dartha Lodge, PA-C  fluvoxaMINE (LUVOX) 100 MG tablet Take 1 tablet (100 mg total) by mouth at bedtime. 12/10/17   Pucilowska, Braulio Conte B, MD  gabapentin (NEURONTIN) 300 MG capsule Take 2 capsules (600 mg total) by mouth 2 (two) times daily. 12/10/17   Pucilowska, Braulio Conte B, MD  hydrOXYzine (ATARAX/VISTARIL) 50 MG tablet Take 1 tablet (50 mg total) by mouth 3 (three) times daily.  12/10/17   Pucilowska, Braulio Conte B, MD  levETIRAcetam (KEPPRA) 250 MG tablet Take 1 tablet (250 mg total) by mouth at bedtime. 12/10/17   Pucilowska, Braulio Conte B, MD  traZODone (DESYREL) 50 MG tablet Take 1 tablet (50 mg total) by mouth at bedtime. 12/10/17   Shari Prows, MD    Family History Family History  Problem Relation Age of Onset  . Bipolar disorder Mother   . Cancer Maternal Grandmother   . Diabetes Maternal Grandfather   . Hypertension Maternal Grandfather     Social History Social History   Tobacco Use  . Smoking status: Current Every Day Smoker    Packs/day: 1.00    Years: 20.00    Pack years: 20.00    Types: Cigarettes  . Smokeless tobacco: Never Used  Substance Use Topics  . Alcohol use: Yes    Alcohol/week: 3.0 standard drinks    Types: 3 Cans of beer per week    Comment: Per patient 0.5 gallon to gallon when she can but since being in group home she can't do it as often   . Drug use: Yes    Types: "Crack" cocaine, Marijuana, Cocaine    Comment: Per patient uses herion and crack and cocaine "once in a blue moon"     Allergies   Diphenhydramine hcl, Fluoxetine, Baclofen, Depakote [divalproex sodium], and Quetiapine   Review of Systems Review of Systems  Constitutional: Positive for fatigue.  Gastrointestinal: Positive for vomiting.     Physical Exam Updated Vital Signs BP 94/73 (BP Location: Right Arm)   Pulse 97   Resp (!) 21   SpO2 98%   Physical Exam Constitutional:      Comments: Intoxicated, lying in bed in no acute distress  HENT:     Head: Normocephalic and atraumatic.     Mouth/Throat:     Mouth: Mucous membranes are dry.  Eyes:     Extraocular Movements: Extraocular movements intact.     Pupils: Pupils are equal, round, and reactive to light.  Cardiovascular:     Rate and Rhythm: Normal rate and regular rhythm.     Heart sounds: No friction rub.  Pulmonary:     Effort: Pulmonary effort is normal. No respiratory distress.      Breath sounds: Normal breath sounds.  Abdominal:     General: Abdomen is flat. Bowel sounds are normal. There is no distension.     Tenderness: There is no abdominal tenderness.  Musculoskeletal: Normal range of motion.        General: No swelling or tenderness.  Skin:    General: Skin is warm and dry.     Capillary Refill: Capillary refill takes less than 2 seconds.  Neurological:     Comments: Alert, follows commands, opens eyes spontaneously, moves all 4 extremities, mildly lethargic      ED Treatments / Results  Labs (all labs ordered are listed, but only abnormal results are displayed) Labs Reviewed  COMPREHENSIVE METABOLIC PANEL  ETHANOL  CBC  RAPID URINE DRUG SCREEN, HOSP PERFORMED  I-STAT BETA HCG BLOOD, ED ([facility], [facility], [facility] ONLY)    EKG None  Radiology No results found.  Procedures Procedures (including critical care time)  Medications Ordered in ED Medications - No data to display   Initial Impression / Assessment and Plan / ED Course  I have reviewed the triage vital signs and the nursing notes.  Pertinent labs & imaging results that were available during my care of the patient were reviewed by me and considered in my medical decision making (see chart for details).  Clinical Course as of Nov 17 46  Mon Nov 16, 2018  1650 Complete initial assessment   [RD]  1800 Attempted to call emergency contact listed on file, no answer   [RD]  1927 Rechecked patient, still intoxicated, no distress, again denies any complaints, will continue to monitor   [RD]  2152 Recheck patient, requesting to go home   [RD]    Clinical Course User Index [RD] Lucrezia Starch, MD      33 year old presented to ER with concern for alcohol intoxication, vomiting.  When I saw patient she was in no distress, had no ongoing vomiting, did appear to be intoxicated.  On reassessment patient was much more awake and alert, conversing without difficulty.  Labs concerning for some  dehydration but no other acute abnormality besides alcohol intoxication.  Provided patient fluids.  Ambulating without difficulty.  Will discharge home.    Final Clinical Impressions(s) / ED Diagnoses   Final diagnoses:  Alcoholic intoxication without complication [facility])    ED Discharge Orders    None       Lucrezia Starch, MD 11/17/18 [phone]

## 2018-11-16 NOTE — ED Notes (Signed)
Ambulated to bathroom unassisted. Gait steady.

## 2018-11-16 NOTE — ED Triage Notes (Signed)
Arrived by [facility] from home, intoxicated. Refused vital signs by EMS, combative en route to facility. Patient vomited but able to protect airway, also incontinent of bowel and bladder. Patient reports drinking "a lot" of beer today.

## 2018-11-16 NOTE — ED Notes (Signed)
Pt made attempt to urinate however did not urinate in collection device and was not able to send a specimen due to same.

## 2019-01-01 ENCOUNTER — Encounter (HOSPITAL_COMMUNITY): Payer: Self-pay | Admitting: Emergency Medicine

## 2019-01-01 ENCOUNTER — Emergency Department (HOSPITAL_COMMUNITY): Payer: Medicare Other

## 2019-01-01 ENCOUNTER — Other Ambulatory Visit: Payer: Self-pay

## 2019-01-01 ENCOUNTER — Emergency Department (HOSPITAL_COMMUNITY)
Admission: EM | Admit: 2019-01-01 | Discharge: 2019-01-01 | Disposition: A | Discharge status: Home / Self Care | Payer: Medicare Other | Attending: Emergency Medicine | Admitting: Emergency Medicine

## 2019-01-01 DIAGNOSIS — Z89421 Acquired absence of other right toe(s): Secondary | ICD-10-CM | POA: Diagnosis not present

## 2019-01-01 DIAGNOSIS — Z79899 Other long term (current) drug therapy: Secondary | ICD-10-CM | POA: Insufficient documentation

## 2019-01-01 DIAGNOSIS — Y999 Unspecified external cause status: Secondary | ICD-10-CM | POA: Diagnosis not present

## 2019-01-01 DIAGNOSIS — R4183 Borderline intellectual functioning: Secondary | ICD-10-CM | POA: Insufficient documentation

## 2019-01-01 DIAGNOSIS — S0011XA Contusion of right eyelid and periocular area, initial encounter: Secondary | ICD-10-CM | POA: Insufficient documentation

## 2019-01-01 DIAGNOSIS — Y929 Unspecified place or not applicable: Secondary | ICD-10-CM | POA: Insufficient documentation

## 2019-01-01 DIAGNOSIS — F17210 Nicotine dependence, cigarettes, uncomplicated: Secondary | ICD-10-CM | POA: Insufficient documentation

## 2019-01-01 DIAGNOSIS — S0012XA Contusion of left eyelid and periocular area, initial encounter: Secondary | ICD-10-CM | POA: Diagnosis not present

## 2019-01-01 DIAGNOSIS — Y9389 Activity, other specified: Secondary | ICD-10-CM | POA: Diagnosis not present

## 2019-01-01 DIAGNOSIS — G809 Cerebral palsy, unspecified: Secondary | ICD-10-CM | POA: Insufficient documentation

## 2019-01-01 DIAGNOSIS — S0083XA Contusion of other part of head, initial encounter: Secondary | ICD-10-CM

## 2019-01-01 DIAGNOSIS — S0993XA Unspecified injury of face, initial encounter: Secondary | ICD-10-CM | POA: Diagnosis present

## 2019-01-01 MED ORDER — ACETAMINOPHEN 500 MG PO TABS
1000.0000 mg | ORAL_TABLET | Freq: Once | ORAL | Status: AC
  Administered 2019-01-01: 1000 mg via ORAL
  Filled 2019-01-01: qty 2
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
## 2019-01-01 NOTE — Discharge Instructions (Addendum)
It was our pleasure to provide your ER care today - we hope that you feel better.  Icepack/cold to sore area.   Take acetaminophen as need.   Follow up with primary care doctor in the coming week.  For substance abuse issues, see resource guide provided for community/rehab treatment options.   Return to ER if worse, new symptoms, fevers, new or severe pain, change in mental status, change in vision, numbness/weakness, persistent vomiting, trouble breathing, other concern.
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
## 2019-01-01 NOTE — ED Notes (Signed)
Pt given tylenol and drank gingerale

## 2019-01-01 NOTE — ED Notes (Signed)
Patient verbalizes understanding of discharge instructions. Opportunity for questioning and answers were provided. Armband removed by staff, pt discharged from ED ambulatory to home with case manager
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
